# Patient Record
Sex: Female | Born: 1960 | Race: Black or African American | Hispanic: No | Marital: Single | Smoking: Former smoker
Health system: Southern US, Community
[De-identification: ages and names within clinical notes are randomized; demographics above are authoritative.]

## PROBLEM LIST (undated history)

## (undated) DIAGNOSIS — M1990 Unspecified osteoarthritis, unspecified site: Secondary | ICD-10-CM

## (undated) DIAGNOSIS — D649 Anemia, unspecified: Secondary | ICD-10-CM

## (undated) DIAGNOSIS — E785 Hyperlipidemia, unspecified: Secondary | ICD-10-CM

## (undated) DIAGNOSIS — R7301 Impaired fasting glucose: Secondary | ICD-10-CM

## (undated) DIAGNOSIS — F419 Anxiety disorder, unspecified: Secondary | ICD-10-CM

## (undated) DIAGNOSIS — E8881 Metabolic syndrome: Secondary | ICD-10-CM

## (undated) DIAGNOSIS — T7840XA Allergy, unspecified, initial encounter: Secondary | ICD-10-CM

## (undated) DIAGNOSIS — E669 Obesity, unspecified: Secondary | ICD-10-CM

## (undated) DIAGNOSIS — I509 Heart failure, unspecified: Secondary | ICD-10-CM

## (undated) DIAGNOSIS — K219 Gastro-esophageal reflux disease without esophagitis: Secondary | ICD-10-CM

## (undated) HISTORY — DX: Anemia, unspecified: D64.9

## (undated) HISTORY — DX: Metabolic syndrome: E88.810

## (undated) HISTORY — DX: Metabolic syndrome: E88.81

## (undated) HISTORY — PX: KNEE ARTHROSCOPY: SUR90

## (undated) HISTORY — DX: Hyperlipidemia, unspecified: E78.5

## (undated) HISTORY — DX: Obesity, unspecified: E66.9

## (undated) HISTORY — DX: Allergy, unspecified, initial encounter: T78.40XA

## (undated) HISTORY — DX: Gastro-esophageal reflux disease without esophagitis: K21.9

## (undated) HISTORY — DX: Impaired fasting glucose: R73.01

## (undated) HISTORY — DX: Anxiety disorder, unspecified: F41.9

---

## 1996-02-17 DIAGNOSIS — J329 Chronic sinusitis, unspecified: Secondary | ICD-10-CM | POA: Insufficient documentation

## 1997-10-16 DIAGNOSIS — N390 Urinary tract infection, site not specified: Secondary | ICD-10-CM | POA: Insufficient documentation

## 1998-01-29 ENCOUNTER — Emergency Department (HOSPITAL_COMMUNITY): Admission: EM | Admit: 1998-01-29 | Discharge: 1998-01-29 | Payer: Self-pay | Admitting: Emergency Medicine

## 1998-03-23 ENCOUNTER — Encounter: Payer: Self-pay | Admitting: Emergency Medicine

## 1998-03-23 ENCOUNTER — Emergency Department (HOSPITAL_COMMUNITY): Admission: EM | Admit: 1998-03-23 | Discharge: 1998-03-23 | Payer: Self-pay | Admitting: Emergency Medicine

## 1998-03-24 ENCOUNTER — Encounter: Admission: RE | Admit: 1998-03-24 | Discharge: 1998-06-22 | Payer: Self-pay | Admitting: *Deleted

## 1998-03-31 ENCOUNTER — Encounter: Admission: RE | Admit: 1998-03-31 | Discharge: 1998-06-29 | Payer: Self-pay | Admitting: *Deleted

## 1998-04-02 ENCOUNTER — Encounter: Admission: RE | Admit: 1998-04-02 | Discharge: 1998-04-22 | Payer: Self-pay | Admitting: *Deleted

## 1999-10-07 ENCOUNTER — Emergency Department (HOSPITAL_COMMUNITY): Admission: EM | Admit: 1999-10-07 | Discharge: 1999-10-07 | Payer: Self-pay | Admitting: Emergency Medicine

## 1999-10-07 ENCOUNTER — Encounter: Payer: Self-pay | Admitting: Emergency Medicine

## 2001-01-01 ENCOUNTER — Emergency Department (HOSPITAL_COMMUNITY): Admission: EM | Admit: 2001-01-01 | Discharge: 2001-01-01 | Payer: Self-pay

## 2001-11-13 ENCOUNTER — Emergency Department (HOSPITAL_COMMUNITY): Admission: EM | Admit: 2001-11-13 | Discharge: 2001-11-13 | Payer: Self-pay | Admitting: Emergency Medicine

## 2001-11-17 ENCOUNTER — Emergency Department (HOSPITAL_COMMUNITY): Admission: EM | Admit: 2001-11-17 | Discharge: 2001-11-17 | Payer: Self-pay | Admitting: Emergency Medicine

## 2003-05-21 ENCOUNTER — Encounter: Admission: RE | Admit: 2003-05-21 | Discharge: 2003-05-21 | Payer: Self-pay | Admitting: General Surgery

## 2003-06-17 ENCOUNTER — Emergency Department (HOSPITAL_COMMUNITY): Admission: EM | Admit: 2003-06-17 | Discharge: 2003-06-17 | Payer: Self-pay | Admitting: Emergency Medicine

## 2004-04-18 HISTORY — PX: COLONOSCOPY: SHX174

## 2004-04-22 ENCOUNTER — Ambulatory Visit: Payer: Self-pay | Admitting: Gastroenterology

## 2004-05-07 ENCOUNTER — Ambulatory Visit: Payer: Self-pay | Admitting: Gastroenterology

## 2004-05-24 ENCOUNTER — Ambulatory Visit: Payer: Self-pay | Admitting: Oncology

## 2004-05-31 ENCOUNTER — Encounter: Admission: RE | Admit: 2004-05-31 | Discharge: 2004-08-29 | Payer: Self-pay | Admitting: Family Medicine

## 2004-06-18 ENCOUNTER — Ambulatory Visit: Payer: Self-pay | Admitting: Gastroenterology

## 2004-07-14 ENCOUNTER — Other Ambulatory Visit: Admission: RE | Admit: 2004-07-14 | Discharge: 2004-07-14 | Payer: Self-pay | Admitting: Gynecology

## 2004-07-16 ENCOUNTER — Ambulatory Visit: Payer: Self-pay | Admitting: Oncology

## 2004-09-08 ENCOUNTER — Ambulatory Visit: Payer: Self-pay | Admitting: Oncology

## 2004-09-16 ENCOUNTER — Encounter: Admission: RE | Admit: 2004-09-16 | Discharge: 2004-12-15 | Payer: Self-pay | Admitting: Family Medicine

## 2005-05-10 ENCOUNTER — Encounter: Admission: RE | Admit: 2005-05-10 | Discharge: 2005-05-10 | Payer: Self-pay | Admitting: Family Medicine

## 2005-05-19 DIAGNOSIS — R519 Headache, unspecified: Secondary | ICD-10-CM | POA: Insufficient documentation

## 2005-07-13 ENCOUNTER — Encounter: Admission: RE | Admit: 2005-07-13 | Discharge: 2005-07-13 | Payer: Self-pay | Admitting: Family Medicine

## 2006-01-09 ENCOUNTER — Ambulatory Visit: Payer: Self-pay | Admitting: Family Medicine

## 2006-01-17 ENCOUNTER — Ambulatory Visit (HOSPITAL_COMMUNITY): Admission: RE | Admit: 2006-01-17 | Discharge: 2006-01-17 | Payer: Self-pay | Admitting: *Deleted

## 2006-05-25 ENCOUNTER — Ambulatory Visit: Payer: Self-pay | Admitting: Family Medicine

## 2006-05-29 ENCOUNTER — Ambulatory Visit: Payer: Self-pay | Admitting: Family Medicine

## 2006-09-15 ENCOUNTER — Ambulatory Visit: Payer: Self-pay | Admitting: Family Medicine

## 2006-09-15 ENCOUNTER — Other Ambulatory Visit: Admission: RE | Admit: 2006-09-15 | Discharge: 2006-09-15 | Payer: Self-pay | Admitting: Family Medicine

## 2006-09-19 ENCOUNTER — Ambulatory Visit: Payer: Self-pay | Admitting: Family Medicine

## 2006-12-13 ENCOUNTER — Ambulatory Visit: Payer: Self-pay | Admitting: Family Medicine

## 2008-12-10 ENCOUNTER — Ambulatory Visit: Payer: Self-pay | Admitting: Family Medicine

## 2010-01-05 ENCOUNTER — Ambulatory Visit: Payer: Self-pay | Admitting: Family Medicine

## 2010-06-10 ENCOUNTER — Institutional Professional Consult (permissible substitution) (INDEPENDENT_AMBULATORY_CARE_PROVIDER_SITE_OTHER): Payer: BC Managed Care – PPO | Admitting: Family Medicine

## 2010-06-10 DIAGNOSIS — N390 Urinary tract infection, site not specified: Secondary | ICD-10-CM

## 2010-06-10 DIAGNOSIS — J0100 Acute maxillary sinusitis, unspecified: Secondary | ICD-10-CM

## 2010-06-11 ENCOUNTER — Ambulatory Visit (INDEPENDENT_AMBULATORY_CARE_PROVIDER_SITE_OTHER): Payer: BC Managed Care – PPO

## 2010-06-11 DIAGNOSIS — E785 Hyperlipidemia, unspecified: Secondary | ICD-10-CM

## 2010-06-11 DIAGNOSIS — R7301 Impaired fasting glucose: Secondary | ICD-10-CM

## 2010-06-21 ENCOUNTER — Ambulatory Visit: Payer: BC Managed Care – PPO | Admitting: Family Medicine

## 2010-08-19 ENCOUNTER — Encounter: Payer: Self-pay | Admitting: Family Medicine

## 2010-08-19 DIAGNOSIS — Z566 Other physical and mental strain related to work: Secondary | ICD-10-CM

## 2010-08-19 DIAGNOSIS — M25569 Pain in unspecified knee: Secondary | ICD-10-CM

## 2010-08-19 DIAGNOSIS — R220 Localized swelling, mass and lump, head: Secondary | ICD-10-CM

## 2010-08-19 DIAGNOSIS — R51 Headache: Secondary | ICD-10-CM

## 2010-08-19 DIAGNOSIS — N390 Urinary tract infection, site not specified: Secondary | ICD-10-CM

## 2010-08-19 DIAGNOSIS — J329 Chronic sinusitis, unspecified: Secondary | ICD-10-CM

## 2010-08-19 DIAGNOSIS — M549 Dorsalgia, unspecified: Secondary | ICD-10-CM

## 2010-09-03 NOTE — Op Note (Signed)
NAMEPhoebe Perch             ACCOUNT NO.:  [id number]   MEDICAL RECORD NO.:  [id number]          PATIENT TYPE:  AMB   LOCATION:  DAY                          FACILITY:  [facility]   PHYSICIAN:  Jonne Ply, MD   DATE OF BIRTH:  [birth year]   DATE OF PROCEDURE:  01/17/2006  DATE OF DISCHARGE:                                 OPERATIVE REPORT   PROCEDURE:  Ventral hernia repair.   PREOPERATIVE DIAGNOSIS:  Incarcerated ventral hernia.   POSTOPERATIVE DIAGNOSIS:  Incarcerated ventral hernia.   PROCEDURE:  Ventral hernia repair without mesh, primary repair.   SURGEON:  Glean Hess, M.D.   ANESTHESIA:  General.   DESCRIPTION:  The patient was taken to the operating room and placed in the  supine position.  After adequate general anesthesia was induced using  endotracheal tube, the abdomen was prepped and draped in normal sterile  fashion.  Using a supraumbilical vertical midline incision, I dissected down  onto an incarcerated hernia sac which was dissected free easily from the  fascial edges.  The fascial defect really only measured about 1 cm.  The  contents were reduced into the abdomen.  The fascial defect was closed with  figure-of-eight #1 Novofil x2.  This held up a good repair.  Fascial edges  had been previously cleaned off.  Adequate hemostasis was insured, and the  skin was closed with staples.  The patient tolerated the procedure well and  went to PACU in good condition.      Jonne Ply, MD  Electronically Signed     KRE/MEDQ  D:  01/17/2006  T:  01/18/2006  Job:  [id number]

## 2015-05-13 ENCOUNTER — Encounter: Payer: Self-pay | Admitting: Gastroenterology

## 2021-01-25 ENCOUNTER — Other Ambulatory Visit: Payer: Self-pay

## 2021-01-25 ENCOUNTER — Encounter (HOSPITAL_COMMUNITY): Payer: Self-pay

## 2021-01-25 ENCOUNTER — Inpatient Hospital Stay (HOSPITAL_COMMUNITY)
Admission: EM | Admit: 2021-01-25 | Discharge: 2021-01-27 | DRG: 812 | Disposition: A | Discharge status: Home / Self Care | Payer: BLUE CROSS/BLUE SHIELD | Attending: Internal Medicine | Admitting: Internal Medicine

## 2021-01-25 ENCOUNTER — Emergency Department (HOSPITAL_COMMUNITY): Payer: BLUE CROSS/BLUE SHIELD

## 2021-01-25 DIAGNOSIS — M7989 Other specified soft tissue disorders: Secondary | ICD-10-CM | POA: Diagnosis present

## 2021-01-25 DIAGNOSIS — E8881 Metabolic syndrome: Secondary | ICD-10-CM | POA: Diagnosis present

## 2021-01-25 DIAGNOSIS — Z6841 Body Mass Index (BMI) 40.0 and over, adult: Secondary | ICD-10-CM

## 2021-01-25 DIAGNOSIS — R0602 Shortness of breath: Secondary | ICD-10-CM | POA: Diagnosis present

## 2021-01-25 DIAGNOSIS — D649 Anemia, unspecified: Secondary | ICD-10-CM | POA: Diagnosis not present

## 2021-01-25 DIAGNOSIS — K5903 Drug induced constipation: Secondary | ICD-10-CM | POA: Diagnosis present

## 2021-01-25 DIAGNOSIS — I509 Heart failure, unspecified: Secondary | ICD-10-CM

## 2021-01-25 DIAGNOSIS — Z20822 Contact with and (suspected) exposure to covid-19: Secondary | ICD-10-CM | POA: Diagnosis present

## 2021-01-25 DIAGNOSIS — R319 Hematuria, unspecified: Secondary | ICD-10-CM | POA: Diagnosis present

## 2021-01-25 DIAGNOSIS — D500 Iron deficiency anemia secondary to blood loss (chronic): Secondary | ICD-10-CM | POA: Diagnosis not present

## 2021-01-25 DIAGNOSIS — T454X5A Adverse effect of iron and its compounds, initial encounter: Secondary | ICD-10-CM | POA: Diagnosis present

## 2021-01-25 DIAGNOSIS — E119 Type 2 diabetes mellitus without complications: Secondary | ICD-10-CM | POA: Diagnosis present

## 2021-01-25 DIAGNOSIS — E876 Hypokalemia: Secondary | ICD-10-CM | POA: Diagnosis present

## 2021-01-25 DIAGNOSIS — F419 Anxiety disorder, unspecified: Secondary | ICD-10-CM | POA: Diagnosis present

## 2021-01-25 DIAGNOSIS — K219 Gastro-esophageal reflux disease without esophagitis: Secondary | ICD-10-CM | POA: Diagnosis present

## 2021-01-25 DIAGNOSIS — Z87891 Personal history of nicotine dependence: Secondary | ICD-10-CM

## 2021-01-25 DIAGNOSIS — N939 Abnormal uterine and vaginal bleeding, unspecified: Principal | ICD-10-CM | POA: Diagnosis present

## 2021-01-25 DIAGNOSIS — N950 Postmenopausal bleeding: Secondary | ICD-10-CM | POA: Diagnosis present

## 2021-01-25 LAB — URINALYSIS, ROUTINE W REFLEX MICROSCOPIC
Bilirubin Urine: NEGATIVE
Glucose, UA: NEGATIVE mg/dL
Ketones, ur: NEGATIVE mg/dL
Nitrite: NEGATIVE
Protein, ur: >=300 mg/dL — AB
Specific Gravity, Urine: 1.012 (ref 1.005–1.030)
pH: 7 (ref 5.0–8.0)

## 2021-01-25 LAB — BASIC METABOLIC PANEL
Anion gap: 8 (ref 5–15)
BUN: 15 mg/dL (ref 6–20)
CO2: 26 mmol/L (ref 22–32)
Calcium: 9 mg/dL (ref 8.9–10.3)
Chloride: 112 mmol/L — ABNORMAL HIGH (ref 98–111)
Creatinine, Ser: 0.46 mg/dL (ref 0.44–1.00)
GFR, Estimated: >60 mL/min (ref >60)
Glucose, Bld: 93 mg/dL (ref 70–99)
Potassium: 3.3 mmol/L — ABNORMAL LOW (ref 3.5–5.1)
Sodium: 146 mmol/L — ABNORMAL HIGH (ref 135–145)

## 2021-01-25 LAB — WET PREP, GENITAL
Clue Cells Wet Prep HPF POC: NONE SEEN
Sperm: NONE SEEN
Trich, Wet Prep: NONE SEEN

## 2021-01-25 LAB — CBC WITH DIFFERENTIAL/PLATELET
Abs Immature Granulocytes: 0.03 10*3/uL (ref 0.00–0.07)
Basophils Absolute: 0.1 10*3/uL (ref 0.0–0.1)
Basophils Relative: 1 %
Eosinophils Absolute: 0.3 10*3/uL (ref 0.0–0.5)
Eosinophils Relative: 4 %
HCT: 27.2 % — ABNORMAL LOW (ref 36.0–46.0)
Hemoglobin: 6.8 g/dL — CL (ref 12.0–15.0)
Immature Granulocytes: 0 %
Lymphocytes Relative: 14 %
Lymphs Abs: 1 10*3/uL (ref 0.7–4.0)
MCH: 16.2 pg — ABNORMAL LOW (ref 26.0–34.0)
MCHC: 25 g/dL — ABNORMAL LOW (ref 30.0–36.0)
MCV: 64.9 fL — ABNORMAL LOW (ref 80.0–100.0)
Monocytes Absolute: 0.8 10*3/uL (ref 0.1–1.0)
Monocytes Relative: 11 %
Neutro Abs: 5.2 10*3/uL (ref 1.7–7.7)
Neutrophils Relative %: 70 %
Platelets: 333 10*3/uL (ref 150–400)
RBC: 4.19 MIL/uL (ref 3.87–5.11)
RDW: 22.5 % — ABNORMAL HIGH (ref 11.5–15.5)
WBC: 7.4 10*3/uL (ref 4.0–10.5)
nRBC: 0 % (ref 0.0–0.2)

## 2021-01-25 LAB — BRAIN NATRIURETIC PEPTIDE: B Natriuretic Peptide: 182.4 pg/mL — ABNORMAL HIGH (ref 0.0–100.0)

## 2021-01-25 LAB — RETICULOCYTES
Immature Retic Fract: 27.2 % — ABNORMAL HIGH (ref 2.3–15.9)
RBC.: 4.14 MIL/uL (ref 3.87–5.11)
Retic Count, Absolute: 60 10*3/uL (ref 19.0–186.0)
Retic Ct Pct: 1.5 % (ref 0.4–3.1)

## 2021-01-25 LAB — RESP PANEL BY RT-PCR (FLU A&B, COVID) ARPGX2
Influenza A by PCR: NEGATIVE
Influenza B by PCR: NEGATIVE
SARS Coronavirus 2 by RT PCR: NEGATIVE

## 2021-01-25 LAB — IRON AND TIBC
Iron: 18 ug/dL — ABNORMAL LOW (ref 28–170)
Saturation Ratios: 4 % — ABNORMAL LOW (ref 10.4–31.8)
TIBC: 494 ug/dL — ABNORMAL HIGH (ref 250–450)
UIBC: 476 ug/dL

## 2021-01-25 LAB — HIV ANTIBODY (ROUTINE TESTING W REFLEX): HIV Screen 4th Generation wRfx: NONREACTIVE

## 2021-01-25 LAB — FERRITIN: Ferritin: 3 ng/mL — ABNORMAL LOW (ref 11–307)

## 2021-01-25 LAB — ABO/RH: ABO/RH(D): A POS

## 2021-01-25 LAB — VITAMIN B12: Vitamin B-12: 1177 pg/mL — ABNORMAL HIGH (ref 180–914)

## 2021-01-25 LAB — FOLATE: Folate: 17 ng/mL (ref >5.9)

## 2021-01-25 LAB — PREPARE RBC (CROSSMATCH)

## 2021-01-25 MED ORDER — SODIUM CHLORIDE 0.9 % IV SOLN
10.0000 mL/h | Freq: Once | INTRAVENOUS | Status: AC
  Administered 2021-01-25: 10 mL/h via INTRAVENOUS

## 2021-01-25 MED ORDER — ONDANSETRON HCL 4 MG/2ML IJ SOLN: 4.0000 mg | Freq: Four times a day (QID) | INTRAMUSCULAR | Status: DC | PRN

## 2021-01-25 MED ORDER — FERROUS SULFATE 325 (65 FE) MG PO TABS
325.0000 mg | ORAL_TABLET | Freq: Two times a day (BID) | ORAL | Status: DC
  Administered 2021-01-26: 325 mg via ORAL
  Filled 2021-01-25: qty 1

## 2021-01-25 MED ORDER — ONDANSETRON HCL 4 MG PO TABS: 4.0000 mg | ORAL_TABLET | Freq: Four times a day (QID) | ORAL | Status: DC | PRN

## 2021-01-25 MED ORDER — POTASSIUM CHLORIDE 2 MEQ/ML IV SOLN
INTRAVENOUS | Status: DC
  Filled 2021-01-25 (×4): qty 1000

## 2021-01-25 NOTE — ED Triage Notes (Signed)
Patient c/o vaginal bleeding x 1 month. Patient also c/o abdominal swelling and bilateral leg swelling.

## 2021-01-25 NOTE — ED Provider Notes (Signed)
Emergency Medicine Provider Triage Evaluation Note  Amber Herring , a 60 y.o. female  was evaluated in triage.  Pt complains of urinary and vaginal bleeding for a month along with bilateral lower extremity and abdominal swelling.  She states that she believes she was going through menopause and spotting intermittently however appears to have increased vaginal bleeding.  She states that when she gets up from a seated position at times she has about a "shot glass" amount of blood that she believes is coming from her vagina.  She also states that she has had blood in her urine intermittently over the past month.  She states that at times she feels like she has decreased urine output.  Regarding her legs and abdomen she feels like she has "taken on water" and has not been able to mobilize as easy.  She denies any fevers, weight loss, abdominal cramping or bloating.  She denies chest pain or shortness of breath.  She states she does not take any medications other than ibuprofen.  Review of Systems  Positive: Hematuria, vaginal bleeding Negative: Fever, abdominal pain  Physical Exam  BP 140/83 (BP Location: Left Arm)   Pulse (!) 119   Temp 98.2 F (36.8 C) (Oral)   Resp 16   Ht _0  (1.753 m)   Wt (!) 163.3 kg   LMP 01/25/2021   SpO2 99%   BMI 53.16 kg/m  Gen:   Awake, no distress   Resp:  Normal effort  MSK:   Moves extremities without difficulty  Other:  Abdomen is soft and nontender to palpation.  Bowel sounds are present but distant due to habitus.  Lower extremities with nonpitting edema.  GU exam deferred in triage.  Medical Decision Making  Medically screening exam initiated at 11:55 AM.  Appropriate orders placed.  [name] was informed that the remainder of the evaluation will be completed by another provider, this initial triage assessment does not replace that evaluation, and the importance of remaining in the ED until their evaluation is complete.     [name],  PA-C 01/25/21 1158    [name], MD 01/25/21 [id number]

## 2021-01-25 NOTE — H&P (Signed)
History and Physical   Amber Herring VXB:[id number] DOB: 19-Apr-1960 DOA: 01/25/2021  Referring MD/NP/PA: Dr. Francia Greaves  PCP: Denita Lung, MD   Outpatient Specialists: None  Patient coming from: Home  Chief Complaint: Shortness of breath and leg swelling  HPI: Amber Herring is a 60 y.o. female with medical history significant of GERD, metabolic syndrome, morbid obesity, anxiety disorder, chronic iron deficiency anemia, diabetes who has had intermittent vaginal bleed over the last few months.  Patient apparently used to have heavy periods at a younger age.  At that point she was anemic and has been transfused before with also iron therapy.  She could not tolerate the iron therapy at the time due to severe constipation.  She is now postmenopausal but she has noted intermittent vaginal bleed which is painless.  It usually happens when she gets off from laying in bed and before she gets to the bathroom a lot of times she will have the bleeding.  Is usually sizable bleed.  In the last few days she has noticed worsening lower extremity edema, shortness of breath and weakness.  Patient seen in the ER with symptomatic anemia and she is being admitted for further evaluation and treatment..  ED Course: Temperature 98.7, blood pressure 144/104, pulse 121, respirate of 24 and oxygen sat 98% on room air.  White count is 7.4, hemoglobin 6.8 and platelets 333.  Sodium 146 potassium 3.3 chloride 112 CO2 26 BUN 15 creatinine 1.47 calcium 9.0.  COVID-19 negative.  Vitamin B12 1177.  Iron level of 18 with 4% sats.  Wet prep is positive for few cells.  Patient being admitted to the hospital with what appears to be symptomatic anemia probably secondary to vaginal bleed.  Review of Systems: As per HPI otherwise 10 point review of systems negative.    Past Medical History:  Diagnosis Date   Allergy    Anemia    iron defienency   Anxiety    GERD (gastroesophageal reflux disease)    Hyperlipidemia     Impaired fasting glucose    Metabolic syndrome    Obesity     Past Surgical History:  Procedure Laterality Date   COLONOSCOPY  04-2004   KNEE ARTHROSCOPY       reports that she has quit smoking. Her smoking use included cigarettes. She has never used smokeless tobacco. She reports that she does not drink alcohol and does not use drugs.  No Known Allergies  Family History  Problem Relation Age of Onset   ALS Mother      Prior to Admission medications   Not on File    Physical Exam: Vitals:   01/25/21 1401 01/25/21 1500 01/25/21 1645 01/25/21 1800  BP: (!) 144/104 135/78 138/72 122/80  Pulse: 100 (!) 116 (!) 118 (!) 119  Resp: 18 18 (!) 24 18  Temp:      TempSrc:      SpO2: 98% 98% 100% 100%  Weight:      Height:          Constitutional: Morbidly obese, stable, pleasant no distress Vitals:   01/25/21 1401 01/25/21 1500 01/25/21 1645 01/25/21 1800  BP: (!) 144/104 135/78 138/72 122/80  Pulse: 100 (!) 116 (!) 118 (!) 119  Resp: 18 18 (!) 24 18  Temp:      TempSrc:      SpO2: 98% 98% 100% 100%  Weight:      Height:       Eyes: PERRL, lids and conjunctivae  normal ENMT: Mucous membranes are moist. Posterior pharynx clear of any exudate or lesions.Normal dentition.  Neck: normal, supple, no masses, no thyromegaly Respiratory: clear to auscultation bilaterally, no wheezing, no crackles. Normal respiratory effort. No accessory muscle use.  Cardiovascular: Sinus tachycardia, no murmurs / rubs / gallops.  1+ pedal edema.  2+ pedal pulses. No carotid bruits.  Abdomen: no tenderness, no masses palpated. No hepatosplenomegaly. Bowel sounds positive.  Musculoskeletal: no clubbing / cyanosis. No joint deformity upper and lower extremities. Good ROM, no contractures. Normal muscle tone.  Skin: Diffuse chronic whole body skin lesions, lesions, ulcers. No induration Neurologic: CN 2-12 grossly intact. Sensation intact, DTR normal. Strength 5/5 in all 4.  Psychiatric: Normal  judgment and insight. Alert and oriented x 3. Normal mood.     Labs on Admission: I have personally reviewed following labs and imaging studies  CBC: Recent Labs  Lab 01/25/21 1212  WBC 7.4  NEUTROABS 5.2  HGB 6.8*  HCT 27.2*  MCV 64.9*  PLT 654   Basic Metabolic Panel: Recent Labs  Lab 01/25/21 1212  NA 146*  K 3.3*  CL 112*  CO2 26  GLUCOSE 93  BUN 15  CREATININE 0.46  CALCIUM 9.0   GFR: Estimated Creatinine Clearance: 127.1 mL/min (by C-G formula based on SCr of 0.46 mg/dL). Liver Function Tests: No results for input(s): AST, ALT, ALKPHOS, BILITOT, PROT, ALBUMIN in the last 168 hours. No results for input(s): LIPASE, AMYLASE in the last 168 hours. No results for input(s): AMMONIA in the last 168 hours. Coagulation Profile: No results for input(s): INR, PROTIME in the last 168 hours. Cardiac Enzymes: No results for input(s): CKTOTAL, CKMB, CKMBINDEX, TROPONINI in the last 168 hours. BNP (last 3 results) No results for input(s): PROBNP in the last 8760 hours. HbA1C: No results for input(s): HGBA1C in the last 72 hours. CBG: No results for input(s): GLUCAP in the last 168 hours. Lipid Profile: No results for input(s): CHOL, HDL, LDLCALC, TRIG, CHOLHDL, LDLDIRECT in the last 72 hours. Thyroid Function Tests: No results for input(s): TSH, T4TOTAL, FREET4, T3FREE, THYROIDAB in the last 72 hours. Anemia Panel: Recent Labs    01/25/21 1503 01/25/21 1540 01/25/21 1600  VITAMINB12  --   --  1,177*  FOLATE  --  17.0  --   FERRITIN  --   --  3*  TIBC  --   --  494*  IRON  --   --  18*  RETICCTPCT 1.5  --   --    Urine analysis:    Component Value Date/Time   COLORURINE YELLOW 01/25/2021 1430   APPEARANCEUR HAZY (A) 01/25/2021 1430   LABSPEC 1.012 01/25/2021 1430   PHURINE 7.0 01/25/2021 1430   GLUCOSEU NEGATIVE 01/25/2021 1430   HGBUR SMALL (A) 01/25/2021 1430   BILIRUBINUR NEGATIVE 01/25/2021 1430   KETONESUR NEGATIVE 01/25/2021 1430   PROTEINUR >=300  (A) 01/25/2021 1430   NITRITE NEGATIVE 01/25/2021 1430   LEUKOCYTESUR TRACE (A) 01/25/2021 1430   Sepsis Labs: _0 (procalcitonin:4,lacticidven:4) ) Recent Results (from the past 240 hour(s))  Resp Panel by RT-PCR (Flu A&B, Covid) Nasopharyngeal Swab     Status: None   Collection Time: 01/25/21  4:30 PM   Specimen: Nasopharyngeal Swab; Nasopharyngeal(NP) swabs in vial transport medium  Result Value Ref Range Status   SARS Coronavirus 2 by RT PCR NEGATIVE NEGATIVE Final    Comment: (NOTE) SARS-CoV-2 target nucleic acids are NOT DETECTED.  The SARS-CoV-2 RNA is generally detectable in upper respiratory specimens during  the acute phase of infection. The lowest concentration of SARS-CoV-2 viral copies this assay can detect is 138 copies/mL. A negative result does not preclude SARS-Cov-2 infection and should not be used as the sole basis for treatment or other patient management decisions. A negative result may occur with  improper specimen collection/handling, submission of specimen other than nasopharyngeal swab, presence of viral mutation(s) within the areas targeted by this assay, and inadequate number of viral copies(<138 copies/mL). A negative result must be combined with clinical observations, patient history, and epidemiological information. The expected result is Negative.  Fact Sheet for Patients:  EntrepreneurPulse.com.au  Fact Sheet for Healthcare Providers:  IncredibleEmployment.be  This test is no t yet approved or cleared by the [place] FDA and  has been authorized for detection and/or diagnosis of SARS-CoV-2 by FDA under an Emergency Use Authorization (EUA). This EUA will remain  in effect (meaning this test can be used) for the duration of the COVID-19 declaration under Section 564(b)(1) of the Act, 21 U.S.C.section 360bbb-3(b)(1), unless the authorization is terminated  or revoked sooner.       Influenza A by PCR  NEGATIVE NEGATIVE Final   Influenza B by PCR NEGATIVE NEGATIVE Final    Comment: (NOTE) The Xpert Xpress SARS-CoV-2/FLU/RSV plus assay is intended as an aid in the diagnosis of influenza from Nasopharyngeal swab specimens and should not be used as a sole basis for treatment. Nasal washings and aspirates are unacceptable for Xpert Xpress SARS-CoV-2/FLU/RSV testing.  Fact Sheet for Patients: EntrepreneurPulse.com.au  Fact Sheet for Healthcare Providers: IncredibleEmployment.be  This test is not yet approved or cleared by the [place] FDA and has been authorized for detection and/or diagnosis of SARS-CoV-2 by FDA under an Emergency Use Authorization (EUA). This EUA will remain in effect (meaning this test can be used) for the duration of the COVID-19 declaration under Section 564(b)(1) of the Act, 21 U.S.C. section 360bbb-3(b)(1), unless the authorization is terminated or revoked.  Performed at [facility], [place] [address] [place], [place] [address]   Wet prep, genital     Status: Abnormal   Collection Time: 01/25/21  4:30 PM   Specimen: PATH Cytology Cervicovaginal Ancillary Only  Result Value Ref Range Status   Yeast Wet Prep HPF POC PRESENT (A) NONE SEEN Final   Trich, Wet Prep NONE SEEN NONE SEEN Final   Clue Cells Wet Prep HPF POC NONE SEEN NONE SEEN Final   WBC, Wet Prep HPF POC FEW (A) NONE SEEN Final   Sperm NONE SEEN  Final    Comment: Performed at [facility], [place] [address], [place], [place] [address]     Radiological Exams on Admission: DG Chest 2 View  Result Date: 01/25/2021 CLINICAL DATA:  Abdominal swelling and lower extremity swelling with shortness of breath. EXAM: CHEST - 2 VIEW COMPARISON:  None. FINDINGS: Mild, diffusely increased interstitial lung markings are seen. Mild linear atelectasis is noted within the lateral aspects of the bilateral lung bases. There is no evidence of  a pleural effusion or pneumothorax. The cardiac silhouette is moderately enlarged. The visualized skeletal structures are unremarkable. IMPRESSION: 1. Cardiomegaly with mild interstitial edema. 2. Mild bibasilar linear atelectasis. Electronically Signed   By: Virgina Norfolk M.D.   On: 01/25/2021 17:46      Assessment/Plan Principal Problem:   Symptomatic anemia Active Problems:   Morbid obesity (HCC)   Vaginal bleeding   Iron deficiency anemia due to chronic blood loss   Hypokalemia     #  1 symptomatic anemia: Secondary to acute on chronic bleed.  Mostly GI related.  Patient will be admitted.  We will transfuse 1 unit of packed red blood cells.  Observe patient overnight.  ER has discussed with OB/GYN.  Since there is no active ongoing bleeding mainly pulsatile bleed, recommendation from OB/GYN is outpatient follow-up to evaluate the cause.  #2 morbid obesity: Stable at baseline.  #3 recurrent vaginal bleed: Continue with outpatient follow-up with OB/GYN.  #4 chronic iron deficiency: Patient will need iron therapy at discharge.  Recommends scheduled laxatives with her iron treatment.  #5 hypokalemia: Replete potassium.   DVT prophylaxis: SCD Code Status: Full code Family Communication: No family at bedside Disposition Plan: Home Consults called: None at the moment Admission status: Observation  Severity of Illness: The appropriate patient status for this patient is OBSERVATION. Observation status is judged to be reasonable and necessary in order to provide the required intensity of service to ensure the patient's safety. The patient's presenting symptoms, physical exam findings, and initial radiographic and laboratory data in the context of their medical condition is felt to place them at decreased risk for further clinical deterioration. Furthermore, it is anticipated that the patient will be medically stable for discharge from the hospital within 2 midnights of admission. The  following factors support the patient status of observation.   " The patient's presenting symptoms include symptomatic anemia. " The physical exam findings include pale with lower extremity edema. " The initial radiographic and laboratory data are hemoglobin 6.8.   Barbette Merino MD [facility] Pager [phone]  If 7PM-7AM, please contact night-coverage [email or web address] Password [email or web address]  01/25/2021, 7:02 PM

## 2021-01-25 NOTE — ED Provider Notes (Signed)
[facility] DEPT Provider Note   CSN: [id number] Arrival date & time: 01/25/21  1104     History Chief Complaint  Patient presents with   Vaginal Bleeding   Leg Swelling    Amber Herring is a 60 y.o. female with past medical history significant for obesity, diabetes, anemia who presents with 1 month of menstrual bleeding intermittently, occasional crampy pelvic pain, as well as worsening swelling of her lower legs, and shortness of breath.  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  Patient reports that she has had on and off bleeding since her menstrual cycle stopped being regular several years ago, but she has noticed that she sees up to a shock last full of blood intermittently when she stands up, as well as has noted some blood in her urine.  Patient denies any pain with urination, vaginal pain, change in odor, or vaginal discharge.  Patient reports that she has not had to have a transfusion before.  Patient denies chest pain.  Patient denies history of acute coronary syndrome or heart failure.  Patient does not take any medication other than ibuprofen.  Reports she does not follow with a gynecologist, has had no evaluation for menstrual bleeding or post-menopausal bleeding.  Patient is described a frequent pins and needle sensation in bilateral lower extremities along with the new fluid overload.  Patient denies hematemesis, melena, hematochezia.   Vaginal Bleeding Associated symptoms: no nausea and no vaginal discharge       Past Medical History:  Diagnosis Date   Allergy    Anemia    iron defienency   Anxiety    GERD (gastroesophageal reflux disease)    Hyperlipidemia    Impaired fasting glucose    Metabolic syndrome    Obesity     Patient Active Problem List   Diagnosis Date Noted   Symptomatic anemia 01/25/2021   Facial swelling 11/17/2006    Headache(784.0) 05/19/2005   Stress at work 11/17/2003   UTI (lower urinary tract infection) 10/16/1997   Knee pain 06/16/1996   Sinusitis 02/17/1996   Back pain 10/16/1992    Past Surgical History:  Procedure Laterality Date   COLONOSCOPY  04-2004   KNEE ARTHROSCOPY       OB History   No obstetric history on file.     Family History  Problem Relation Age of Onset   ALS Mother     Social History   Tobacco Use   Smoking status: Former    Types: Cigarettes   Smokeless tobacco: Never  Vaping Use   Vaping Use: Never used  Substance Use Topics   Alcohol use: Never   Drug use: Never    Home Medications Prior to Admission medications   Not on File    Allergies    Patient has no known allergies.  Review of Systems   Review of Systems  Respiratory:  Positive for shortness of breath. Negative for chest tightness.   Cardiovascular:  Negative for chest pain and palpitations.  Gastrointestinal:  Positive for abdominal distention. Negative for blood in stool, constipation, diarrhea, nausea and vomiting.  Genitourinary:  Positive for frequency and vaginal bleeding. Negative for vaginal discharge and vaginal pain.  All other systems reviewed and are negative.  Physical Exam Updated Vital Signs BP 122/80   Pulse (!) 119   Temp 98.2 F (36.8 C) (Oral)   Resp 18  Ht _0  (1.753 m)   Wt (!) 163.3 kg   LMP 01/25/2021   SpO2 100%   BMI 53.16 kg/m   Physical Exam Vitals and nursing note reviewed.  Constitutional:      Appearance: Normal appearance. She is obese. She is ill-appearing.     Comments: Obese, fluid overloaded, pale woman sitting on bed in no acute distress  HENT:     Head: Normocephalic and atraumatic.  Eyes:     General: Scleral icterus present.        Right eye: No discharge.        Left eye: No discharge.     Comments: Patient has some evidence of pinguecula right eye, minimal scleral icterus  Periorbital edema  Cardiovascular:     Rate  and Rhythm: Regular rhythm. Tachycardia present.     Heart sounds: No murmur heard.   No friction rub. No gallop.  Pulmonary:     Effort: Pulmonary effort is normal.     Breath sounds: Normal breath sounds.  Abdominal:     General: Bowel sounds are normal. There is distension.     Palpations: Abdomen is soft.     Tenderness: There is no abdominal tenderness. There is no right CVA tenderness or left CVA tenderness.  Genitourinary:    General: Normal vulva.     Comments: Multiparous, normal appearing cervix Small amount of mucus and blood in vaginal vault Bimanual exam tolerated with some feeling of pressure without pain No masses palpated, no polyps seen on speculum examination Musculoskeletal:     Right lower leg: Edema present.     Left lower leg: Edema present.     Comments: 3-4+ pitting edema equal bilateral lower extremities, dry skin and evidence of some early stasis dermatitis, no ulcers or signs of infection.  No tenderness to palpation throughout lower extremities.  Skin:    General: Skin is warm and dry.     Capillary Refill: Capillary refill takes 2 to 3 seconds.  Neurological:     Mental Status: She is alert and oriented to person, place, and time.  Psychiatric:        Mood and Affect: Mood normal.        Behavior: Behavior normal.    ED Results / Procedures / Treatments   Labs (all labs ordered are listed, but only abnormal results are displayed) Labs Reviewed  WET PREP, GENITAL - Abnormal; Notable for the following components:      Result Value   Yeast Wet Prep HPF POC PRESENT (*)    WBC, Wet Prep HPF POC FEW (*)    All other components within normal limits  URINALYSIS, ROUTINE W REFLEX MICROSCOPIC - Abnormal; Notable for the following components:   APPearance HAZY (*)    Hgb urine dipstick SMALL (*)    Protein, ur >=300 (*)    Leukocytes,Ua TRACE (*)    Bacteria, UA MANY (*)    All other components within normal limits  BASIC METABOLIC PANEL - Abnormal;  Notable for the following components:   Sodium 146 (*)    Potassium 3.3 (*)    Chloride 112 (*)    All other components within normal limits  CBC WITH DIFFERENTIAL/PLATELET - Abnormal; Notable for the following components:   Hemoglobin 6.8 (*)    HCT 27.2 (*)    MCV 64.9 (*)    MCH 16.2 (*)    MCHC 25.0 (*)    RDW 22.5 (*)    All other components  within normal limits  VITAMIN B12 - Abnormal; Notable for the following components:   Vitamin B-12 1,177 (*)    All other components within normal limits  IRON AND TIBC - Abnormal; Notable for the following components:   Iron 18 (*)    TIBC 494 (*)    Saturation Ratios 4 (*)    All other components within normal limits  FERRITIN - Abnormal; Notable for the following components:   Ferritin 3 (*)    All other components within normal limits  RETICULOCYTES - Abnormal; Notable for the following components:   Immature Retic Fract 27.2 (*)    All other components within normal limits  BRAIN NATRIURETIC PEPTIDE - Abnormal; Notable for the following components:   B Natriuretic Peptide 182.4 (*)    All other components within normal limits  RESP PANEL BY RT-PCR (FLU A&B, COVID) ARPGX2  FOLATE  TYPE AND SCREEN  PREPARE RBC (CROSSMATCH)  ABO/RH  GC/CHLAMYDIA PROBE AMP ([place]) NOT AT [facility]    EKG None  Radiology DG Chest 2 View  Result Date: 01/25/2021 CLINICAL DATA:  Abdominal swelling and lower extremity swelling with shortness of breath. EXAM: CHEST - 2 VIEW COMPARISON:  None. FINDINGS: Mild, diffusely increased interstitial lung markings are seen. Mild linear atelectasis is noted within the lateral aspects of the bilateral lung bases. There is no evidence of a pleural effusion or pneumothorax. The cardiac silhouette is moderately enlarged. The visualized skeletal structures are unremarkable. IMPRESSION: 1. Cardiomegaly with mild interstitial edema. 2. Mild bibasilar linear atelectasis. Electronically Signed   By: Virgina Norfolk  M.D.   On: 01/25/2021 17:46    Procedures Procedures   Medications Ordered in ED Medications  0.9 %  sodium chloride infusion (10 mL/hr Intravenous New Bag/Given 01/25/21 1755)    ED Course  I have reviewed the triage vital signs and the nursing notes.  Pertinent labs & imaging results that were available during my care of the patient were reviewed by me and considered in my medical decision making (see chart for details).    MDM Rules/Calculators/A&P                         I discussed this case with my attending physician who cosigned this note including patient's presenting symptoms, physical exam, and planned diagnostics and interventions. Attending physician stated agreement with plan or made changes to plan which were implemented.   Attending physician assessed patient at bedside.  Tachycardic, short of breath, pale woman with signs of symptomatic anemia.  Also signs of fluid overload including periorbital edema, abdominal distention, 3-4+ pitting edema of lower extremities without signs of infection or ulceration.  Initial lab work-up is significant for hemoglobin of 6.8, in context of symptomatic anemia we will type and screen, get iron studies and other investigation of bleeding as well as transfuse patient with 1 unit slowly.  May consider Lasix after transfusion given patient appears fluid overloaded, significant third spacing.  Depleted intravascularly and significant fluid overload in the third space.  Overall normal pelvic exam, however with significant vaginal bleeding and a 60 year old postmenopausal woman significant concern for pelvic abnormality including adenomyosis, fibroids, endometrial or cervical cancer.  Plan to admit for symptomatic anemia, fluid overload, and recommend OB/GYN consult for endometrial biopsy during admission.  Will consult OB/GYN at this time.  Does have elevated BNP at 182, interstitial edema on chest x-ray, cardiomegaly.  Dr. Elgie Congo with OB/GYN  does not feel the need for  urgent evaluation during hospital admission, but does want patient to follow-up for endometrial biopsy once she is discharged.  Will consult hospitalist for admission at this time for symptomatic anemia requiring transfusion as well as new onset heart failure. Hospitalist accepts admission at this time. Final Clinical Impression(s) / ED Diagnoses Final diagnoses:  Vaginal bleeding  Anemia, unspecified type  Acute heart failure, unspecified heart failure type [facility])    Rx / DC Orders ED Discharge Orders     None        Anselmo Pickler, PA-C 01/25/21 1852    Godfrey Pick, MD 01/26/21 1342

## 2021-01-26 DIAGNOSIS — D649 Anemia, unspecified: Secondary | ICD-10-CM | POA: Diagnosis not present

## 2021-01-26 DIAGNOSIS — D500 Iron deficiency anemia secondary to blood loss (chronic): Secondary | ICD-10-CM | POA: Diagnosis not present

## 2021-01-26 LAB — COMPREHENSIVE METABOLIC PANEL WITH GFR
ALT: 13 U/L (ref 0–44)
AST: 27 U/L (ref 15–41)
Albumin: 3.5 g/dL (ref 3.5–5.0)
Alkaline Phosphatase: 68 U/L (ref 38–126)
Anion gap: 6 (ref 5–15)
BUN: 14 mg/dL (ref 6–20)
CO2: 27 mmol/L (ref 22–32)
Calcium: 8.8 mg/dL — ABNORMAL LOW (ref 8.9–10.3)
Chloride: 108 mmol/L (ref 98–111)
Creatinine, Ser: 0.79 mg/dL (ref 0.44–1.00)
GFR, Estimated: >60 mL/min
Glucose, Bld: 106 mg/dL — ABNORMAL HIGH (ref 70–99)
Potassium: 4.1 mmol/L (ref 3.5–5.1)
Sodium: 141 mmol/L (ref 135–145)
Total Bilirubin: 1.1 mg/dL (ref 0.3–1.2)
Total Protein: 7.3 g/dL (ref 6.5–8.1)

## 2021-01-26 LAB — GC/CHLAMYDIA PROBE AMP (~~LOC~~) NOT AT ARMC
Chlamydia: NEGATIVE
Comment: NEGATIVE
Comment: NORMAL
Neisseria Gonorrhea: NEGATIVE

## 2021-01-26 LAB — CBC
HCT: 27.2 % — ABNORMAL LOW (ref 36.0–46.0)
Hemoglobin: 7.2 g/dL — ABNORMAL LOW (ref 12.0–15.0)
MCH: 17.3 pg — ABNORMAL LOW (ref 26.0–34.0)
MCHC: 26.5 g/dL — ABNORMAL LOW (ref 30.0–36.0)
MCV: 65.5 fL — ABNORMAL LOW (ref 80.0–100.0)
Platelets: 323 10*3/uL (ref 150–400)
RBC: 4.15 MIL/uL (ref 3.87–5.11)
RDW: 23.2 % — ABNORMAL HIGH (ref 11.5–15.5)
WBC: 7.4 10*3/uL (ref 4.0–10.5)
nRBC: 0.3 % — ABNORMAL HIGH (ref 0.0–0.2)

## 2021-01-26 LAB — PREPARE RBC (CROSSMATCH)

## 2021-01-26 LAB — HEMOGLOBIN AND HEMATOCRIT, BLOOD
HCT: 28.9 % — ABNORMAL LOW (ref 36.0–46.0)
Hemoglobin: 7.9 g/dL — ABNORMAL LOW (ref 12.0–15.0)

## 2021-01-26 MED ORDER — SODIUM CHLORIDE 0.9 % IV SOLN: INTRAVENOUS | Status: DC

## 2021-01-26 MED ORDER — SODIUM CHLORIDE 0.9 % IV SOLN
1000.0000 mg | Freq: Once | INTRAVENOUS | Status: AC
  Administered 2021-01-26: 1000 mg via INTRAVENOUS
  Filled 2021-01-26: qty 20

## 2021-01-26 MED ORDER — FUROSEMIDE 10 MG/ML IJ SOLN
20.0000 mg | Freq: Once | INTRAMUSCULAR | Status: AC
  Administered 2021-01-26: 20 mg via INTRAVENOUS
  Filled 2021-01-26: qty 4

## 2021-01-26 MED ORDER — SODIUM CHLORIDE 0.9 % IV SOLN
25.0000 mg | Freq: Once | INTRAVENOUS | Status: AC
  Administered 2021-01-26: 25 mg via INTRAVENOUS
  Filled 2021-01-26: qty 0.5

## 2021-01-26 MED ORDER — SODIUM CHLORIDE 0.9% IV SOLUTION: Freq: Once | INTRAVENOUS | Status: AC

## 2021-01-26 NOTE — ED Notes (Signed)
Per attending, cancel DC at this time and proceed with admission

## 2021-01-26 NOTE — Discharge Summary (Addendum)
Physician Discharge Summary   Patient name: Amber Herring  Admit date:     01/25/2021  Discharge date: 01/27/2021  Attending Physician: Dwyane Dee [phone]  Discharge Physician: Dwyane Dee   PCP: Denita Lung, MD    Follow-up Information     Denita Lung, MD Follow up in 1 week(s).   Specialty: Family Medicine Why: Will need GYN referral for abnormal uterine bleeding Contact information: [address] [place] [address] [phone]                 Recommendations at discharge: Follow up with PCP for GYN referral   Discharge Diagnoses Principal Problem:   Symptomatic anemia Active Problems:   Vaginal bleeding   Iron deficiency anemia due to chronic blood loss   Morbid obesity (HCC)   Hypokalemia   Resolved Diagnoses Resolved Problems:   * No resolved hospital problems. [facility] Course    Amber Herring is a 60 y.o. female with PMH GERD, metabolic syndrome, obesity, anxiety, chronic IDA, DM II who presented with intermittent vaginal bleeding. She is unaware when she technically went thru menopause.  However, she endorses that she has very intermittent spotting. She has been on oral iron but does not tolerate well chronically due to constipation. Due to worsening shortness of breath and weakness, she presented to the ER for further evaluation. Hemoglobin found to be 6.8 g/dL and she was transfused total of 3 units PRBC during hospitalization.  Hemoglobin at discharge was 8.7 g/dL. Iron stores were also noted to be severely low (Iron 18, Sat ratio 4%, ferritin 3). She underwent Infed infusion for this as well.  She was recommended to follow-up with her primary care provider for a GYN referral due to abnormal uterine bleeding.   Procedures performed:    Condition at discharge: stable  Exam Physical Exam Constitutional:      Appearance: Normal appearance. She is obese.  HENT:     Head: Normocephalic and atraumatic.      Mouth/Throat:     Mouth: Mucous membranes are moist.  Eyes:     Extraocular Movements: Extraocular movements intact.  Cardiovascular:     Rate and Rhythm: Regular rhythm. Tachycardia present.     Heart sounds: No murmur heard. Pulmonary:     Effort: Pulmonary effort is normal.     Breath sounds: Normal breath sounds. No wheezing.  Abdominal:     General: Bowel sounds are normal.     Palpations: Abdomen is soft.     Tenderness: There is no abdominal tenderness.  Musculoskeletal:        General: Normal range of motion.     Cervical back: Normal range of motion and neck supple.  Skin:    General: Skin is warm and dry.  Neurological:     General: No focal deficit present.     Mental Status: She is alert.  Psychiatric:        Mood and Affect: Mood normal.     Disposition: Home  Discharge time: greater than 30 minutes. Allergies as of 01/27/2021   No Known Allergies      Medication List     TAKE these medications    APPLE CIDER VINEGAR PO Take 1 tablet by mouth daily.   Fish Oil 1000 MG Caps Take 1,000 mg by mouth daily.   ibuprofen 200 MG tablet Commonly known as: ADVIL Take 400 mg by mouth every 6 (six) hours as needed for mild pain.  multivitamin with minerals Tabs tablet Take 1 tablet by mouth daily.   OVER THE COUNTER MEDICATION Take 1 capsule by mouth daily at 6 (six) AM. Beet root capsule   Primatene Mist 0.125 MG/ACT Aero Generic drug: EPINEPHrine Inhale 1 puff into the lungs daily as needed (wheezing).        DG Chest 2 View  Result Date: 01/25/2021 CLINICAL DATA:  Abdominal swelling and lower extremity swelling with shortness of breath. EXAM: CHEST - 2 VIEW COMPARISON:  None. FINDINGS: Mild, diffusely increased interstitial lung markings are seen. Mild linear atelectasis is noted within the lateral aspects of the bilateral lung bases. There is no evidence of a pleural effusion or pneumothorax. The cardiac silhouette is moderately enlarged. The  visualized skeletal structures are unremarkable. IMPRESSION: 1. Cardiomegaly with mild interstitial edema. 2. Mild bibasilar linear atelectasis. Electronically Signed   By: Virgina Norfolk M.D.   On: 01/25/2021 17:46   Results for orders placed or performed during the hospital encounter of 01/25/21  Resp Panel by RT-PCR (Flu A&B, Covid) Nasopharyngeal Swab     Status: None   Collection Time: 01/25/21  4:30 PM   Specimen: Nasopharyngeal Swab; Nasopharyngeal(NP) swabs in vial transport medium  Result Value Ref Range Status   SARS Coronavirus 2 by RT PCR NEGATIVE NEGATIVE Final    Comment: (NOTE) SARS-CoV-2 target nucleic acids are NOT DETECTED.  The SARS-CoV-2 RNA is generally detectable in upper respiratory specimens during the acute phase of infection. The lowest concentration of SARS-CoV-2 viral copies this assay can detect is 138 copies/mL. A negative result does not preclude SARS-Cov-2 infection and should not be used as the sole basis for treatment or other patient management decisions. A negative result may occur with  improper specimen collection/handling, submission of specimen other than nasopharyngeal swab, presence of viral mutation(s) within the areas targeted by this assay, and inadequate number of viral copies(<138 copies/mL). A negative result must be combined with clinical observations, patient history, and epidemiological information. The expected result is Negative.  Fact Sheet for Patients:  EntrepreneurPulse.com.au  Fact Sheet for Healthcare Providers:  IncredibleEmployment.be  This test is no t yet approved or cleared by the [place] FDA and  has been authorized for detection and/or diagnosis of SARS-CoV-2 by FDA under an Emergency Use Authorization (EUA). This EUA will remain  in effect (meaning this test can be used) for the duration of the COVID-19 declaration under Section 564(b)(1) of the Act, 21 U.S.C.section  360bbb-3(b)(1), unless the authorization is terminated  or revoked sooner.       Influenza A by PCR NEGATIVE NEGATIVE Final   Influenza B by PCR NEGATIVE NEGATIVE Final    Comment: (NOTE) The Xpert Xpress SARS-CoV-2/FLU/RSV plus assay is intended as an aid in the diagnosis of influenza from Nasopharyngeal swab specimens and should not be used as a sole basis for treatment. Nasal washings and aspirates are unacceptable for Xpert Xpress SARS-CoV-2/FLU/RSV testing.  Fact Sheet for Patients: EntrepreneurPulse.com.au  Fact Sheet for Healthcare Providers: IncredibleEmployment.be  This test is not yet approved or cleared by the [place] FDA and has been authorized for detection and/or diagnosis of SARS-CoV-2 by FDA under an Emergency Use Authorization (EUA). This EUA will remain in effect (meaning this test can be used) for the duration of the COVID-19 declaration under Section 564(b)(1) of the Act, 21 U.S.C. section 360bbb-3(b)(1), unless the authorization is terminated or revoked.  Performed at [facility], [place] [address], [place], [place] [address]   Lenard Forth  prep, genital     Status: Abnormal   Collection Time: 01/25/21  4:30 PM   Specimen: PATH Cytology Cervicovaginal Ancillary Only  Result Value Ref Range Status   Yeast Wet Prep HPF POC PRESENT (A) NONE SEEN Final   Trich, Wet Prep NONE SEEN NONE SEEN Final   Clue Cells Wet Prep HPF POC NONE SEEN NONE SEEN Final   WBC, Wet Prep HPF POC FEW (A) NONE SEEN Final   Sperm NONE SEEN  Final    Comment: Performed at [facility], [place] [address] [place], [place] [address]    Signed:  Dwyane Dee MD.  [facility] 01/27/2021, 3:39 PM

## 2021-01-27 DIAGNOSIS — K5903 Drug induced constipation: Secondary | ICD-10-CM | POA: Diagnosis present

## 2021-01-27 DIAGNOSIS — E876 Hypokalemia: Secondary | ICD-10-CM | POA: Diagnosis present

## 2021-01-27 DIAGNOSIS — E8881 Metabolic syndrome: Secondary | ICD-10-CM | POA: Diagnosis present

## 2021-01-27 DIAGNOSIS — D500 Iron deficiency anemia secondary to blood loss (chronic): Secondary | ICD-10-CM | POA: Diagnosis present

## 2021-01-27 DIAGNOSIS — N950 Postmenopausal bleeding: Secondary | ICD-10-CM | POA: Diagnosis present

## 2021-01-27 DIAGNOSIS — T454X5A Adverse effect of iron and its compounds, initial encounter: Secondary | ICD-10-CM | POA: Diagnosis present

## 2021-01-27 DIAGNOSIS — M7989 Other specified soft tissue disorders: Secondary | ICD-10-CM | POA: Diagnosis present

## 2021-01-27 DIAGNOSIS — R319 Hematuria, unspecified: Secondary | ICD-10-CM | POA: Diagnosis present

## 2021-01-27 DIAGNOSIS — N939 Abnormal uterine and vaginal bleeding, unspecified: Secondary | ICD-10-CM | POA: Diagnosis present

## 2021-01-27 DIAGNOSIS — F419 Anxiety disorder, unspecified: Secondary | ICD-10-CM | POA: Diagnosis present

## 2021-01-27 DIAGNOSIS — D649 Anemia, unspecified: Secondary | ICD-10-CM | POA: Diagnosis present

## 2021-01-27 DIAGNOSIS — K219 Gastro-esophageal reflux disease without esophagitis: Secondary | ICD-10-CM | POA: Diagnosis present

## 2021-01-27 DIAGNOSIS — E119 Type 2 diabetes mellitus without complications: Secondary | ICD-10-CM | POA: Diagnosis present

## 2021-01-27 DIAGNOSIS — R0602 Shortness of breath: Secondary | ICD-10-CM | POA: Diagnosis present

## 2021-01-27 DIAGNOSIS — Z87891 Personal history of nicotine dependence: Secondary | ICD-10-CM | POA: Diagnosis not present

## 2021-01-27 DIAGNOSIS — Z20822 Contact with and (suspected) exposure to covid-19: Secondary | ICD-10-CM | POA: Diagnosis present

## 2021-01-27 DIAGNOSIS — Z6841 Body Mass Index (BMI) 40.0 and over, adult: Secondary | ICD-10-CM | POA: Diagnosis not present

## 2021-01-27 LAB — CBC WITH DIFFERENTIAL/PLATELET
Abs Immature Granulocytes: 0.04 10*3/uL (ref 0.00–0.07)
Basophils Absolute: 0.1 10*3/uL (ref 0.0–0.1)
Basophils Relative: 1 %
Eosinophils Absolute: 0.4 10*3/uL (ref 0.0–0.5)
Eosinophils Relative: 5 %
HCT: 29.3 % — ABNORMAL LOW (ref 36.0–46.0)
Hemoglobin: 7.9 g/dL — ABNORMAL LOW (ref 12.0–15.0)
Immature Granulocytes: 1 %
Lymphocytes Relative: 18 %
Lymphs Abs: 1.5 10*3/uL (ref 0.7–4.0)
MCH: 18 pg — ABNORMAL LOW (ref 26.0–34.0)
MCHC: 27 g/dL — ABNORMAL LOW (ref 30.0–36.0)
MCV: 66.9 fL — ABNORMAL LOW (ref 80.0–100.0)
Monocytes Absolute: 0.9 10*3/uL (ref 0.1–1.0)
Monocytes Relative: 11 %
Neutro Abs: 5.3 10*3/uL (ref 1.7–7.7)
Neutrophils Relative %: 64 %
Platelets: 311 10*3/uL (ref 150–400)
RBC: 4.38 MIL/uL (ref 3.87–5.11)
RDW: 24.2 % — ABNORMAL HIGH (ref 11.5–15.5)
WBC: 8.2 10*3/uL (ref 4.0–10.5)
nRBC: 0.4 % — ABNORMAL HIGH (ref 0.0–0.2)

## 2021-01-27 LAB — HEMOGLOBIN AND HEMATOCRIT, BLOOD
HCT: 32.2 % — ABNORMAL LOW (ref 36.0–46.0)
Hemoglobin: 8.7 g/dL — ABNORMAL LOW (ref 12.0–15.0)

## 2021-01-27 LAB — BASIC METABOLIC PANEL
Anion gap: 6 (ref 5–15)
BUN: 13 mg/dL (ref 6–20)
CO2: 27 mmol/L (ref 22–32)
Calcium: 8.9 mg/dL (ref 8.9–10.3)
Chloride: 108 mmol/L (ref 98–111)
Creatinine, Ser: 0.78 mg/dL (ref 0.44–1.00)
GFR, Estimated: >60 mL/min (ref >60)
Glucose, Bld: 99 mg/dL (ref 70–99)
Potassium: 3.5 mmol/L (ref 3.5–5.1)
Sodium: 141 mmol/L (ref 135–145)

## 2021-01-27 LAB — MAGNESIUM: Magnesium: 1.9 mg/dL (ref 1.7–2.4)

## 2021-01-27 LAB — PREPARE RBC (CROSSMATCH)

## 2021-01-27 MED ORDER — SODIUM CHLORIDE 0.9% IV SOLUTION
Freq: Once | INTRAVENOUS | Status: AC
  Administered 2021-01-27: 10 mL via INTRAVENOUS

## 2021-01-27 NOTE — Progress Notes (Signed)
  Progress Note    Amber Herring   FVC:[id number]  DOB: 09-13-1960  DOA: 01/25/2021     1 Date of Service: 01/27/2021    Subjective:  No events overnight.  Seen in the ER this morning awaiting a room.  Still complains of ongoing fatigue and generalized malaise.  Remains tachycardic and states that she does note feeling her heart racing.  Hospital Problems  Symptomatic anemia - Patient presented with intermittent vaginal bleeding.  Patient unsure when menopause transition took place however endorses ongoing intermittent vaginal bleeding -Hemoglobin 6.8 g/dL on admission.  She remained tachycardic requiring IV fluids and repetitive blood transfusions to achieve stability -2 units PRBC given on 01/26/2021, patient remained symptomatic with fatigue/lethargy and tachycardia -IV fluids started for overnight and planning to repeat hemoglobin in a.m. -She will need outpatient follow-up with GYN after discharge  IDA -Iron stores significantly low - INFeD ordered for repletion during hospitalization  Objective Vital signs were reviewed and unremarkable.  Vitals:   01/27/21 1200 01/27/21 1301 01/27/21 1302 01/27/21 1329  BP: (!) 140/101 (!) 138/104 (!) 138/104 (!) 138/104  Pulse: (!) 119 (!) 120 (!) 120 (!) 120  Resp: _0 Temp:  98.3 F (36.8 C) 98.3 F (36.8 C) 98.3 F (36.8 C)  TempSrc:  Oral Oral Oral  SpO2: 96%  97% 97%  Weight:      Height:       (!) 163.3 kg  Exam Physical Exam Constitutional:      Appearance: Normal appearance. She is obese.  HENT:     Head: Normocephalic and atraumatic.     Mouth/Throat:     Mouth: Mucous membranes are moist.  Eyes:     Extraocular Movements: Extraocular movements intact.  Cardiovascular:     Rate and Rhythm: Normal rate and regular rhythm.     Heart sounds: Normal heart sounds.  Pulmonary:     Effort: Pulmonary effort is normal.     Breath sounds: Normal breath sounds. No wheezing.  Abdominal:     General: Bowel  sounds are normal. There is no distension.     Palpations: Abdomen is soft.  Musculoskeletal:        General: Normal range of motion.     Cervical back: Normal range of motion and neck supple.  Skin:    General: Skin is warm and dry.  Neurological:     General: No focal deficit present.     Mental Status: She is alert.  Psychiatric:        Mood and Affect: Mood normal.        Behavior: Behavior normal.     Labs / Other Information My review of labs, imaging, notes and other tests is significant for Hgb 7.9 g/dL    Time spent: Greater than 50% of the 35 minute visit was spent in counseling/coordination of care for the patient as laid out in the A&P.  Dwyane Dee, MD [facility] 01/27/2021, 3:45 PM

## 2021-01-28 LAB — BPAM RBC
Blood Product Expiration Date: 202211012359
Blood Product Expiration Date: 202211042359
Blood Product Expiration Date: 202211052359
ISSUE DATE / TIME: 202210102052
ISSUE DATE / TIME: 202210111357
ISSUE DATE / TIME: 202210120937
Unit Type and Rh: 6200
Unit Type and Rh: 6200
Unit Type and Rh: 6200

## 2021-01-28 LAB — TYPE AND SCREEN
ABO/RH(D): A POS
Antibody Screen: NEGATIVE
Unit division: 0
Unit division: 0
Unit division: 0

## 2021-01-29 ENCOUNTER — Encounter (HOSPITAL_COMMUNITY): Payer: Self-pay | Admitting: Emergency Medicine

## 2021-01-29 ENCOUNTER — Emergency Department (HOSPITAL_COMMUNITY): Payer: BLUE CROSS/BLUE SHIELD

## 2021-01-29 ENCOUNTER — Other Ambulatory Visit: Payer: Self-pay

## 2021-01-29 ENCOUNTER — Emergency Department (HOSPITAL_COMMUNITY)
Admission: EM | Admit: 2021-01-29 | Discharge: 2021-01-29 | Disposition: A | Discharge status: Home / Self Care | Payer: BLUE CROSS/BLUE SHIELD | Attending: Emergency Medicine | Admitting: Emergency Medicine

## 2021-01-29 DIAGNOSIS — S62343A Nondisplaced fracture of base of third metacarpal bone, left hand, initial encounter for closed fracture: Secondary | ICD-10-CM | POA: Insufficient documentation

## 2021-01-29 DIAGNOSIS — S62347A Nondisplaced fracture of base of fifth metacarpal bone. left hand, initial encounter for closed fracture: Secondary | ICD-10-CM | POA: Insufficient documentation

## 2021-01-29 DIAGNOSIS — S62341A Nondisplaced fracture of base of second metacarpal bone. left hand, initial encounter for closed fracture: Secondary | ICD-10-CM | POA: Diagnosis not present

## 2021-01-29 DIAGNOSIS — W01198A Fall on same level from slipping, tripping and stumbling with subsequent striking against other object, initial encounter: Secondary | ICD-10-CM | POA: Diagnosis not present

## 2021-01-29 DIAGNOSIS — Y92812 Truck as the place of occurrence of the external cause: Secondary | ICD-10-CM | POA: Diagnosis not present

## 2021-01-29 DIAGNOSIS — W19XXXA Unspecified fall, initial encounter: Secondary | ICD-10-CM

## 2021-01-29 DIAGNOSIS — Z87891 Personal history of nicotine dependence: Secondary | ICD-10-CM | POA: Insufficient documentation

## 2021-01-29 DIAGNOSIS — S6992XA Unspecified injury of left wrist, hand and finger(s), initial encounter: Secondary | ICD-10-CM | POA: Diagnosis present

## 2021-01-29 DIAGNOSIS — S62309A Unspecified fracture of unspecified metacarpal bone, initial encounter for closed fracture: Secondary | ICD-10-CM

## 2021-01-29 MED ORDER — HYDROCODONE-ACETAMINOPHEN 5-325 MG PO TABS: 1.0000 | ORAL_TABLET | Freq: Four times a day (QID) | ORAL | 0 refills | Status: DC | PRN

## 2021-01-29 MED ORDER — HYDROCODONE-ACETAMINOPHEN 5-325 MG PO TABS
1.0000 | ORAL_TABLET | Freq: Once | ORAL | Status: AC
  Administered 2021-01-29: 1 via ORAL
  Filled 2021-01-29: qty 1

## 2021-01-29 NOTE — ED Provider Notes (Signed)
[facility] DEPT Provider Note   CSN: [id number] Arrival date & time: 01/29/21  1250     History Chief Complaint  Patient presents with   Fredirick Lathe is a 60 y.o. female.  JARELYN BAMBACH is a 60 y.o. female with a history of anemia, hyperlipidemia, GERD, who presents to the ED for evaluation after a fall.  Patient reports she was recently discharged from the hospital after an admission for symptomatic anemia with blood transfusion.  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  She had another trip and fall injuring the same hand and wrist the following day and since then it has been increasingly painful and swollen.  She denies any numbness or tingling.  She is not on blood thinners.  No other injuries from the fall, did not hit her head and denies any neck or back injury.  The history is provided by the patient.      Past Medical History:  Diagnosis Date   Allergy    Anemia    iron defienency   Anxiety    GERD (gastroesophageal reflux disease)    Hyperlipidemia    Impaired fasting glucose    Metabolic syndrome    Obesity     Patient Active Problem List   Diagnosis Date Noted   Symptomatic anemia 01/25/2021   Morbid obesity ([place]) 01/25/2021   Vaginal bleeding 01/25/2021   Iron deficiency anemia due to chronic blood loss 01/25/2021   Hypokalemia 01/25/2021   Facial swelling 11/17/2006   Headache(784.0) 05/19/2005   Stress at work 11/17/2003   UTI (lower urinary tract infection) 10/16/1997   Knee pain 06/16/1996   Sinusitis 02/17/1996   Back pain 10/16/1992    Past Surgical History:  Procedure Laterality Date   COLONOSCOPY  04-2004   KNEE ARTHROSCOPY       OB History   No obstetric history on file.     Family History  Problem Relation Age of Onset   ALS Mother     Social History   Tobacco Use    Smoking status: Former    Types: Cigarettes   Smokeless tobacco: Never  Vaping Use   Vaping Use: Never used  Substance Use Topics   Alcohol use: Never   Drug use: Never    Home Medications Prior to Admission medications   Medication Sig Start Date End Date Taking? Authorizing Provider  HYDROcodone-acetaminophen (NORCO) 5-325 MG tablet Take 1 tablet by mouth every 6 (six) hours as needed. 01/29/21  Yes Jacqlyn Larsen, PA-C  APPLE CIDER VINEGAR PO Take 1 tablet by mouth daily.    [provider]  EPINEPHrine (PRIMATENE MIST) 0.125 MG/ACT AERO Inhale 1 puff into the lungs daily as needed (wheezing).    [provider]  ibuprofen (ADVIL) 200 MG tablet Take 400 mg by mouth every 6 (six) hours as needed for mild pain.    [provider]  Multiple Vitamin (MULTIVITAMIN WITH MINERALS) TABS tablet Take 1 tablet by mouth daily.    [provider]  Omega-3 Fatty Acids (FISH OIL) 1000 MG CAPS Take 1,000 mg by mouth daily.    [provider]  OVER THE COUNTER MEDICATION Take 1 capsule by mouth daily at 6 (six) AM. Beet root capsule    [provider]    Allergies  Patient has no known allergies.  Review of Systems   Review of Systems  Constitutional:  Negative for chills and fever.  Musculoskeletal:  Positive for arthralgias and joint swelling.  Neurological:  Negative for weakness and numbness.  All other systems reviewed and are negative.  Physical Exam Updated Vital Signs BP (!) 153/97 (BP Location: Right Arm)   Pulse 83   Temp 98 F (36.7 C) (Oral)   Resp 16   LMP 01/25/2021   SpO2 99%   Physical Exam Vitals and nursing note reviewed.  Constitutional:      General: She is not in acute distress.    Appearance: Normal appearance. She is well-developed. She is not ill-appearing or diaphoretic.  HENT:     Head: Normocephalic and atraumatic.  Eyes:     General:        Right eye: No discharge.        Left eye: No  discharge.  Pulmonary:     Effort: Pulmonary effort is normal. No respiratory distress.  Musculoskeletal:     Comments: There is tenderness and soft tissue swelling primarily on the dorsum of the left hand as well as some bruising to the palm and wrist, bruising extends to the mid forearm.  No tenderness in the upper forearm or elbow and patient has normal range of motion of the wrist and elbow but pain with movement of the wrist and fingers.  Despite swelling patient has strong radial and ulnar pulses and normal cap refill.  Normal sensation.  Skin:    General: Skin is warm and dry.  Neurological:     Mental Status: She is alert and oriented to person, place, and time.     Coordination: Coordination normal.  Psychiatric:        Mood and Affect: Mood normal.        Behavior: Behavior normal.    ED Results / Procedures / Treatments   Labs (all labs ordered are listed, but only abnormal results are displayed) Labs Reviewed - No data to display  EKG None  Radiology No results found.  Procedures Procedures   Medications Ordered in ED Medications  HYDROcodone-acetaminophen (NORCO/VICODIN) 5-325 MG per tablet 1 tablet (1 tablet Oral Given 01/29/21 1610)    ED Course  I have reviewed the triage vital signs and the nursing notes.  Pertinent labs & imaging results that were available during my care of the patient were reviewed by me and considered in my medical decision making (see chart for details).    MDM Rules/Calculators/A&P                           60 year old female presents after she tripped and fell twice injuring her left hand and wrist.  She has significant soft tissue swelling but despite this has strong radial pulse and normal sensation, no concern for compartment syndrome, no overlying erythema or warmth to suggest infection.  X-rays of the hand significant for acute fractures of the second third and fifth metacarpals involving the base, question possible additional  small nondisplaced fracture involving the base of the fourth metacarpal.  Discussed with Orion Crook with orthopedics, recommends volar splinting and outpatient follow-up.  Patient looks pain treated here in the ED and she has been placed in a volar splint which she tolerated well.  Will prescribe short course of pain medication.  And have patient call to schedule follow-up with hand specialist.  Stressed the importance of icing  and elevating the hand to help reduce swelling and gave strict return precautions.  Patient expresses understanding and agreement.  Discharged home in good condition.  Final Clinical Impression(s) / ED Diagnoses Final diagnoses:  Fall, initial encounter  Closed fracture of multiple metacarpal bones, initial encounter    Rx / DC Orders ED Discharge Orders          Ordered    HYDROcodone-acetaminophen (NORCO) 5-325 MG tablet  Every 6 hours PRN        01/29/21 1555             Jacqlyn Larsen, [place] 01/31/21 Bovina, Ankit, MD 02/01/21 1512

## 2021-01-29 NOTE — ED Triage Notes (Signed)
PT c/o fall 2 days ago with swelling to L hand.

## 2021-01-29 NOTE — Discharge Instructions (Addendum)
You have fractures to your 2nd, 3rd and 5th Metacarpals. You will need to remain in the splint until you are seen by Dr. Tempie Donning with Hand Surgery, please call to schedule a follow up appointment.   Keep splint clean and dry.  Elevate hand as much as possible to help with swelling.  You can apply ice as well.  To treat pain you can use Tylenol 650 mg and ibuprofen 400 mg every 6 hours, for severe breakthrough pain you may use 1 tablet of Norco as prescribed every 6 hours.  Use caution with this medication.  Do not drive and make sure you get up slowly to avoid further falls or injury.  If you notice that your hand is becoming numb or significantly more painful, or you notice that your fingers are becoming discolored you should immediately return to the emergency department

## 2021-01-29 NOTE — Progress Notes (Signed)
Orthopedic Tech Progress Note Patient Details:  Amber Herring [birth year] [id number]  Ortho Devices Type of Ortho Device: Volar splint Ortho Device/Splint Location: Left wrist Ortho Device/Splint Interventions: Application   Post Interventions Patient Tolerated: Well Instructions Provided: Care of device  [name] [name] 01/29/2021, 4:01 PM

## 2021-02-02 ENCOUNTER — Encounter: Payer: Self-pay | Admitting: Orthopedic Surgery

## 2021-02-02 ENCOUNTER — Other Ambulatory Visit: Payer: Self-pay

## 2021-02-02 ENCOUNTER — Ambulatory Visit: Payer: BLUE CROSS/BLUE SHIELD | Admitting: Orthopedic Surgery

## 2021-02-02 VITALS — BP 118/70 | HR 119 | Ht 69.0 in | Wt 360.0 lb

## 2021-02-02 DIAGNOSIS — M79642 Pain in left hand: Secondary | ICD-10-CM

## 2021-02-02 DIAGNOSIS — S62319A Displaced fracture of base of unspecified metacarpal bone, initial encounter for closed fracture: Secondary | ICD-10-CM

## 2021-02-02 NOTE — Progress Notes (Signed)
Office Visit Note   Patient: Amber Herring           Date of Birth: Jul 12, 1960           MRN: [id number] Visit Date: 02/02/2021              Requested by: Denita Lung, MD [place],  [place] [id number] PCP: Denita Lung, MD   Assessment & Plan: Visit Diagnoses:  1. Fracture of metacarpal base of left hand, closed, initial encounter     Plan: Discussed with patient that she has fractures of multiple metacarpal bases.  She has fractures of the base of the index and small fracture at the base of the middle finger metacarpal.  There is no evidence of subluxation or dislocation of the index or middle finger CMC joint.  The articular surface of the index metacarpal remains congruent.  She also has a small avulsion type fracture of the base of the fifth metacarpal with no displacement of the fifth CMC joint.   Discussed that we will treat her with immobilization.  We will put her in a volar splint today and have her follow-up with hand therapy.  They will work with her on range of motion, edema control and fabricate a Thermoplast splint.  I can see her back in about 3 to 4 weeks with repeat x-rays.  Follow-Up Instructions: No follow-ups on file.   Orders:  Orders Placed This Encounter  Procedures   Ambulatory referral to Occupational Therapy   No orders of the defined types were placed in this encounter.     Procedures: Splinting  Date/Time: 02/02/2021 12:02 PM Performed by: Sherilyn Cooter, MD Authorized by: Sherilyn Cooter, MD   Consent Given by:  Patient Location:  Hand  left hand Fracture Type: first metacarpal, second metacarpal and fifth metacarpal   Neurovascularly intact   Distal Perfusion: normal   Distal Sensation: normal   Manipulation Performed?: No   Immobilization:  Splint Is this the patient's first splint for this injury?: No   Splint/Brace Type:  Volar short arm Supplies Used:  Fiberglass and cotton padding Neurovascularly  intact   Distal Perfusion: normal   Distal Sensation: normal   Patient tolerance:  Patient tolerated the procedure well with no immediate complications   Clinical Data: No additional findings.   Subjective: Chief Complaint  Patient presents with   Left Hand - Fracture    This is a 60 year old left-hand-dominant female who presents for ER follow-up after a ground-level fall last Wednesday in which she landed on her left hand.  She was seen in the ER where she was found to have multiple minimally displaced metacarpal base fractures.  She describes diffuse swelling of her hand with ecchymosis in her palm.  Her pain is 5 out of 10 at worst.  She has been in a relatively tight volar splint.  She denies pain elsewhere in her wrist forearm or elbow.  She denies previous injury to this hand.   Review of Systems   Objective: Vital Signs: BP 118/70 (BP Location: Right Arm, Patient Position: Sitting)   Pulse (!) 119   Ht _0  (1.753 m)   Wt (!) 360 lb (163.3 kg)   LMP 01/25/2021   BMI 53.16 kg/m   Physical Exam Constitutional:      Appearance: She is obese.  Cardiovascular:     Pulses: Normal pulses.  Pulmonary:     Effort: Pulmonary effort is normal.  Skin:  General: Skin is warm and dry.     Capillary Refill: Capillary refill takes less than 2 seconds.     Findings: Bruising present.  Neurological:     Mental Status: She is alert.    Left Hand Exam   Tenderness  Left hand tenderness location: TTP at dorsum of hand along MC bases.   Range of Motion  The patient has normal left wrist ROM.  Muscle Strength  The patient has normal left wrist strength.  Other  Erythema: absent Sensation: normal Pulse: present  Comments:  Palmar ecchymosis with significant dorsal swelling.  Able to make near complete fist with normal digital cascade.  Full flexion/extension at wrist.       Specialty Comments:  No specialty comments available.  Imaging: 3 views of left hand  from the ER this weekend reviewed by me.  They demonstrate multiple metacarpal fractures.  She is a fracture of the index metacarpal base with a maintained congruent articular surface and no evidence of subluxation of the joint.  She has a small minimally displaced or no nondisplaced fracture of the middle finger metacarpal base.  She also has a small avulsion type fracture from the base of the fifth metacarpal with no evidence of fifth CMC subluxation or instability.   PMFS History: Patient Active Problem List   Diagnosis Date Noted   Symptomatic anemia 01/25/2021   Morbid obesity ([place]) 01/25/2021   Vaginal bleeding 01/25/2021   Iron deficiency anemia due to chronic blood loss 01/25/2021   Hypokalemia 01/25/2021   Facial swelling 11/17/2006   Headache(784.0) 05/19/2005   Stress at work 11/17/2003   UTI (lower urinary tract infection) 10/16/1997   Knee pain 06/16/1996   Sinusitis 02/17/1996   Back pain 10/16/1992   Past Medical History:  Diagnosis Date   Allergy    Anemia    iron defienency   Anxiety    GERD (gastroesophageal reflux disease)    Hyperlipidemia    Impaired fasting glucose    Metabolic syndrome    Obesity     Family History  Problem Relation Age of Onset   ALS Mother     Past Surgical History:  Procedure Laterality Date   COLONOSCOPY  04-2004   KNEE ARTHROSCOPY     Social History   Occupational History   Not on file  Tobacco Use   Smoking status: Former    Types: Cigarettes   Smokeless tobacco: Never  Vaping Use   Vaping Use: Never used  Substance and Sexual Activity   Alcohol use: Never   Drug use: Never   Sexual activity: Not on file

## 2021-02-11 ENCOUNTER — Other Ambulatory Visit: Payer: Self-pay

## 2021-02-11 ENCOUNTER — Ambulatory Visit: Payer: Self-pay | Admitting: Occupational Therapy

## 2021-02-11 ENCOUNTER — Encounter: Payer: Self-pay | Admitting: Occupational Therapy

## 2021-02-11 DIAGNOSIS — R278 Other lack of coordination: Secondary | ICD-10-CM

## 2021-02-11 DIAGNOSIS — M25642 Stiffness of left hand, not elsewhere classified: Secondary | ICD-10-CM

## 2021-02-11 DIAGNOSIS — M6281 Muscle weakness (generalized): Secondary | ICD-10-CM

## 2021-02-11 DIAGNOSIS — R600 Localized edema: Secondary | ICD-10-CM

## 2021-02-11 DIAGNOSIS — M25542 Pain in joints of left hand: Secondary | ICD-10-CM

## 2021-02-11 NOTE — Patient Instructions (Signed)
WEARING SCHEDULE:  Wear splint at ALL times except for hygiene care. Keep hand/splint dry and cover it to take a shower. Do not remove the splint. It is for protection to your broken bones in your hand.  PURPOSE:  To prevent movement and for protection until injury can heal  CARE OF SPLINT:  Keep splint away from heat sources including: stove, radiator or furnace, or a car in sunlight. The splint can melt and will no longer fit you properly  Keep away from pets and children  Clean the splint with rubbing alcohol as needed.  * During this time, make sure you also clean your hand/arm as instructed by your therapist and/or perform dressing changes as needed. Then dry hand/arm completely before replacing splint. (When cleaning hand/arm, keep it immobilized in same position until splint is replaced)  PRECAUTIONS/POTENTIAL PROBLEMS: *If you notice or experience increased pain, swelling, numbness, or a lingering reddened area from the splint: Contact your therapist immediately by calling [phone] You must wear the splint for protection, but we will get you scheduled for adjustments as quickly as possible.  (If only straps or hooks need to be replaced and NO adjustments to the splint need to be made, just call the office ahead and let them know you are coming in)  If you have any medical concerns or signs of infection, please call your doctor immediately

## 2021-02-11 NOTE — Therapy (Signed)
[facility] Physical Therapy [address] [place], [place], [address] Phone: [phone]   Fax:  [phone]  Occupational Therapy Evaluation  Patient Details  Name: Amber Herring MRN: [id number] Date of Birth: Jun 29, 1960 Referring Provider (OT): Dr Tempie Donning   Encounter Date: 02/11/2021   OT End of Session - 02/11/21 1228     Visit Number 1    Number of Visits 10    Date for OT Re-Evaluation 03/11/21   Every 10 visits   Authorization Type [employer]    Authorization - Visit Number 1    Progress Note Due on Visit 10    OT Start Time 1106    OT Stop Time 1217    OT Time Calculation (min) 71 min    Activity Tolerance Patient tolerated treatment well    Behavior During Therapy [facility] for tasks assessed/performed             Past Medical History:  Diagnosis Date   Allergy    Anemia    iron defienency   Anxiety    GERD (gastroesophageal reflux disease)    Hyperlipidemia    Impaired fasting glucose    Metabolic syndrome    Obesity     Past Surgical History:  Procedure Laterality Date   COLONOSCOPY  04-2004   KNEE ARTHROSCOPY      There were no vitals filed for this visit.   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 She presents for custom protective splinting today per Dr Tempie Donning.    Patient is accompanied by: Family member    Pertinent History Anemia, knee pain, back pain, allergy, anxiety, GERD, obesity, hypokalemia, hyperlipidemia, metabolic syndrome    Currently in Pain? Yes    Pain Score 6     Pain Location Wrist    Pain Orientation Left    Pain Descriptors / Indicators Throbbing;Discomfort    Pain Type Acute pain;Surgical pain    Pain Onset 1 to 4 weeks ago    Pain Frequency Intermittent    Aggravating Factors  getting  in and out of bed    Pain Relieving Factors resting and positioning    Multiple Pain Sites No               [facility] OT Assessment - 02/11/21 0001       Assessment   Medical Diagnosis Multiple metacarpal base fratures index and middle fingers    Referring Provider (OT) Dr Tempie Donning    Onset Date/Surgical Date 01/27/21    Hand Dominance Left    Next MD Visit TBD      Precautions   Precautions Other (comment)   As per metacarpal base fractures/healing   Required Braces or Orthoses Other Brace/Splint   Protective splinting     Restrictions   Weight Bearing Restrictions Yes    LUE Weight Bearing Non weight bearing      Balance Screen   Has the patient fallen in the past 6 months Yes   1 x fall when anemic   How many times? 1    Has the patient had a decrease in activity level because of a fear of falling?  No    Is the patient reluctant to leave their home because of a fear of falling?  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 Pt also reports WB when getting into bed and states that she has had pain. Splint is being held on with ace wrap upon arrival.      Sensation   Light Touch Appears Intact      Coordination   Gross Motor Movements  are Fluid and Coordinated Yes    Fine Motor Movements are Fluid and Coordinated No    Coordination Impaired      Edema   Edema Moderate edema noted left dorsal hand and Min edema digits as observed visually in clinic today.   Mod - significant edema L dorsal hand noted. Pt daughter also reports removing splint, that was applied in MD office, at home prior to this visit.     ROM / Strength   AROM / PROM / Strength --   NT secondary to fractures     Hand Function   Left Hand Gross Grasp Impaired    Left Hand Grip (lbs) NT    Comment NT secondary to fractures               OT Treatments/Exercises (OP) - 02/11/21 0001       ADLs   ADL Comments Patient was educated in splinting use, care and precautions as well as basic edema control techniques and tendon gliding ex's within the confines of her splint. She was cautioned to not remove the splint as it is for protection during fracture healing. She was instructed in wrapping her hand and keeping her hand dry while bathing. No lifting, puching or pulling with left UE as pt described removing her previous splint at home and WB through her left hand to get into bed.      Splinting   Splinting Pt was fitted with a custom volar splint that places her left wrist in neutral and her MP's in flexion to ~20* secondary to edema/pain (goal is MP flexion to ~45-60*).  IP's are free for A/ROM. She was educated in splinting use, care and precautions as well as basic edema control techniques and tendon gliding ex's within the confines of her splint. She was cautioned to not remove the splint as it is for protection during fracture healing             WEARING SCHEDULE:  Wear splint at ALL times except for hygiene care. Keep hand/splint dry and cover it to take a shower. Do not remove the splint. It is for protection to your broken bones in your hand.  PURPOSE:  To prevent movement and  for protection until injury can heal  CARE OF SPLINT:  Keep  splint away from heat sources including: stove, radiator or furnace, or a car in sunlight. The splint can melt and will no longer fit you properly  Keep away from pets and children  Clean the splint with rubbing alcohol as needed.  * During this time, make sure you also clean your hand/arm as instructed by your therapist and/or perform dressing changes as needed. Then dry hand/arm completely before replacing splint. (When cleaning hand/arm, keep it immobilized in same position until splint is replaced)  PRECAUTIONS/POTENTIAL PROBLEMS: *If you notice or experience increased pain, swelling, numbness, or a lingering reddened area from the splint: Contact your therapist immediately by calling [phone] You must wear the splint for protection, but we will get you scheduled for adjustments as quickly as possible.  (If only straps or hooks need to be replaced and NO adjustments to the splint need to be made, just call the office ahead and let them know you are coming in)  If you have any medical concerns or signs of infection, please call your doctor immediately    OT Education - 02/11/21 1225     Education Details Splinting use, care and precautions. Wear for protection while fractures are healing. Do not remove splint. Edema control and A/ROM digits/tendon gliding.    Person(s) Educated Patient    Methods Explanation;Demonstration;Handout    Comprehension Verbalized understanding;Need further instruction              OT Short Term Goals - 02/11/21 1254       OT SHORT TERM GOAL #1   Title Pt will be Mod I splinting use, care and precautions L hand    Time 4    Period Weeks    Status New    Target Date 03/11/21      OT SHORT TERM GOAL #2   Title Pt will be Mod I initial HEP left hand/digits for tendon gliding, A/ROM of IP joints within confines of her splint    Time 4    Period Weeks    Status New    Target Date 03/11/21               OT Long Term Goals - 02/11/21 1255        OT LONG TERM GOAL #1   Title Pt will be Mod I updated HEP L hand    Time 8    Period Weeks    Status New    Target Date 04/08/21      OT LONG TERM GOAL #2   Title Pt will be Mod I edema control techniques L hand/wrist    Time 8    Period Weeks    Status New    Target Date 04/08/21      OT LONG TERM GOAL #3   Title Pt will be Mod I ADL's for light functional activity using left as dominant hand    Time 8    Period Weeks    Status New    Target Date 04/08/21      OT LONG TERM GOAL #4   Title Pt will demonstrate improved A/ROM as seen by ability to make a flat fist in preparation for grip tasks L hand    Time 8    Period Weeks    Status New    Target Date 04/08/21      OT LONG TERM GOAL #5   Title Pt will report  pain in left hand/wrist as 3/10 or less during light functional activity and or ADL's    Time 8    Period Weeks    Status New    Target Date 04/08/21                   Plan - 02/11/21 1229     Clinical Impression Statement Pt is a left hand dominant 60 y/o female s/p multiple metacarpal base fractures to her IF, middle and small fingers after a fall on 01/27/21. Her PMH includes but is not limited to: Anemia, knee pain, back pain, allergy, anxiety, GERD, obesity, hypokalemia, hyperlipidemia, metabolic syndrome. Please see chart for complete PMH. 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 She presents today per Dr Tempie Donning for protective volar splinting, edema control and ROM. She has deficits in areas of left hand edema, she reports that she had been removing her protective splint as applied in the orthopedic office during her last visit. She reports pain in the left dorsal hand/metacarpals. She was fitted with a custom volar splint that places her left wrist in neutral and allows for MP flexion to ~20* secondary to edema/pain and IP's are free. She was educated in splinting use, care and precautions as well  as basic edema control techniques and tendon gliding ex's within the confines of her splint. She was cautioned to not remove the splint as it is for protection during fracture healing. She rates her pain as 6/10 today. She should benefit from continued out-pt OT/hand therapy for splint check and adjustments, edema management, HEP and pt-education. She will f/u in 1 week for splint check/adjustments.    OT Occupational Profile and History Problem Focused Assessment - Including review of records relating to presenting problem    Occupational performance deficits (Please refer to evaluation for details): ADL's    Body Structure / Function / Physical Skills ADL;Strength;Dexterity;Pain;Edema;UE functional use;ROM;Coordination;Flexibility;Mobility;Decreased knowledge of precautions;FMC    Rehab Potential Fair    Clinical Decision Making Limited treatment options, no task modification necessary    Comorbidities Affecting Occupational Performance: May have comorbidities impacting occupational performance    Modification or Assistance to Complete Evaluation  No modification of tasks or assist necessary to complete eval    OT Frequency Other (comment)   1-2x/week   OT Duration 8 weeks   Eval + up to 12 additional visits   OT Treatment/Interventions Self-care/ADL training;Fluidtherapy;Splinting;Compression bandaging;Therapeutic activities;Therapeutic exercise;Passive range of motion;Manual Therapy;Patient/family education;Cryotherapy    Plan Splint check and adjustment, consider dorsal/clamshell type component PRN left. Edema control and IP A/ROM.    Consulted and Agree with Plan of Care Patient;Family member/caregiver    Family Member Consulted Adult daughter             Patient will benefit from skilled therapeutic intervention in order to improve the following deficits and impairments:   Body Structure / Function / Physical Skills: ADL, Strength, Dexterity, Pain, Edema, UE functional use, ROM,  Coordination, Flexibility, Mobility, Decreased knowledge of precautions, [place]       Visit Diagnosis: Pain in joint of left hand - Plan: Ot plan of care cert/re-cert  Localized edema - Plan: Ot plan of care cert/re-cert  Stiffness of left hand, not elsewhere classified - Plan: Ot plan of care cert/re-cert  Other lack of coordination - Plan: Ot plan of care cert/re-cert  Muscle weakness (generalized) - Plan: Ot plan of care cert/re-cert    Problem List  Patient Active Problem List   Diagnosis Date Noted   Symptomatic anemia 01/25/2021   Morbid obesity ([place]) 01/25/2021   Vaginal bleeding 01/25/2021   Iron deficiency anemia due to chronic blood loss 01/25/2021   Hypokalemia 01/25/2021   Facial swelling 11/17/2006   Headache(784.0) 05/19/2005   Stress at work 11/17/2003   UTI (lower urinary tract infection) 10/16/1997   Knee pain 06/16/1996   Sinusitis 02/17/1996   Back pain 10/16/1992    Almyra Deforest, OT/L 02/11/2021, 2:17 PM  [facility] Physical Therapy [address] [place], [place], [address] Phone: [phone]   Fax:  [phone]  Name: Amber Herring MRN: [id number] Date of Birth: [birth year]

## 2021-02-19 ENCOUNTER — Telehealth: Payer: Self-pay | Admitting: Orthopedic Surgery

## 2021-02-19 NOTE — Telephone Encounter (Signed)
Dr. Tempie Donning, please advise work status and provide work note.  Received call from patient. She has forms and would like to email them to me. I emailed her the Josem Kaufmann so she has my email address.. I did explain the form ([employer]) protocol verbally and also again in my email.

## 2021-02-23 ENCOUNTER — Other Ambulatory Visit: Payer: Self-pay

## 2021-02-23 ENCOUNTER — Encounter: Payer: Self-pay | Admitting: Occupational Therapy

## 2021-02-23 ENCOUNTER — Ambulatory Visit (INDEPENDENT_AMBULATORY_CARE_PROVIDER_SITE_OTHER): Payer: Self-pay | Admitting: Occupational Therapy

## 2021-02-23 DIAGNOSIS — R600 Localized edema: Secondary | ICD-10-CM

## 2021-02-23 DIAGNOSIS — M6281 Muscle weakness (generalized): Secondary | ICD-10-CM

## 2021-02-23 DIAGNOSIS — M25642 Stiffness of left hand, not elsewhere classified: Secondary | ICD-10-CM

## 2021-02-23 DIAGNOSIS — R278 Other lack of coordination: Secondary | ICD-10-CM

## 2021-02-23 DIAGNOSIS — M25542 Pain in joints of left hand: Secondary | ICD-10-CM

## 2021-02-23 NOTE — Therapy (Signed)
[facility] Physical Therapy [address] [place], [place], [address] Phone: [phone]   Fax:  [phone]  Occupational Therapy Treatment  Patient Details  Name: Amber Herring MRN: [id number] Date of Birth: [birth year] Referring Provider (OT): Dr Tempie Donning   Encounter Date: 02/23/2021   OT End of Session - 02/23/21 1612     Visit Number 2    Number of Visits 10    Date for OT Re-Evaluation 03/11/21   Every 10 visits   Authorization Type BCBS    Progress Note Due on Visit 10    OT Start Time 1434    OT Stop Time 1539    OT Time Calculation (min) 65 min    Activity Tolerance Patient tolerated treatment well    Behavior During Therapy [facility] for tasks assessed/performed             Past Medical History:  Diagnosis Date   Allergy    Anemia    iron defienency   Anxiety    GERD (gastroesophageal reflux disease)    Hyperlipidemia    Impaired fasting glucose    Metabolic syndrome    Obesity     Past Surgical History:  Procedure Laterality Date   COLONOSCOPY  04-2004   KNEE ARTHROSCOPY      There were no vitals filed for this visit.   Subjective Assessment - 02/23/21 1442     Subjective  Pt reports decreased pain and edema at this time. However she states that she sometimes wakes up at night in pain left hand.    Pertinent History Anemia, knee pain, back pain, allergy, anxiety, GERD, obesity, hypokalemia, hyperlipidemia, metabolic syndrome    Currently in Pain? No/denies    Pain Score 0-No pain    Multiple Pain Sites No                OT Treatments/Exercises (OP) - 02/23/21 0001       ADLs   ADL Comments Verbal review of splinting use, care and prcautions L hand. Pt verbalized understanding in clinic.   Issued edema control stockinette     Splinting   Splinting Pt has decreased edema noted in left hand therefore, a new custom based forearm clamshell splint was fabricated placing her MCP's in ~45* with IP's free for motion. This  splint is forearm based, volar & dorsal and all wear, care and precautions were reviewed. Also reviewed edema control techniques and elevation of her arm with frequent A/ROM to left elbow and shoulder to avoid stiffness.                    OT Education - 02/23/21 1611     Education Details Splinting use, care and precautions. Wear for protection while fractures are healing. Do not remove splint. Edema control and A/ROM digits/tendon gliding to PIP's and DIP's within splint.    Person(s) Educated Patient    Methods Explanation;Demonstration;Handout    Comprehension Verbalized understanding;Need further instruction              OT Short Term Goals - 02/11/21 1254       OT SHORT TERM GOAL #1   Title Pt will be Mod I splinting use, care and precautions L hand    Time 4    Period Weeks    Status New    Target Date 03/11/21      OT SHORT TERM GOAL #2   Title Pt will be Mod I initial HEP left hand/digits for tendon  gliding, A/ROM of IP joints within confines of her splint    Time 4    Period Weeks    Status New    Target Date 03/11/21               OT Long Term Goals - 02/11/21 1255       OT LONG TERM GOAL #1   Title Pt will be Mod I updated HEP L hand    Time 8    Period Weeks    Status New    Target Date 04/08/21      OT LONG TERM GOAL #2   Title Pt will be Mod I edema control techniques L hand/wrist    Time 8    Period Weeks    Status New    Target Date 04/08/21      OT LONG TERM GOAL #3   Title Pt will be Mod I ADL's for light functional activity using left as dominant hand    Time 8    Period Weeks    Status New    Target Date 04/08/21      OT LONG TERM GOAL #4   Title Pt will demonstrate improved A/ROM as seen by ability to make a flat fist in preparation for grip tasks L hand    Time 8    Period Weeks    Status New    Target Date 04/08/21      OT LONG TERM GOAL #5   Title Pt will report pain in left hand/wrist as 3/10 or less during  light functional activity and or ADL's    Time 8    Period Weeks    Status New    Target Date 04/08/21                   Plan - 02/23/21 1613     Clinical Impression Statement Pt has a noted decrease in edema in left hand therefore, a custom clamshell, forearm based splint was fabricated today placing her left hand MCP's in ~45* flexion with IP's free for motion. All splinting wear and care and edem acontrol techniques were reviewed in the clinic and pt verbalized understanding. Pt should benefit from f/u in 1-2 weeks for splint adjustments PRN.  She is currently 3 weeks and 6 days s/p left dominant hand metacarpal base fractures to index, middle and small fingers.    OT Occupational Profile and History Problem Focused Assessment - Including review of records relating to presenting problem    Occupational performance deficits (Please refer to evaluation for details): ADL's    Body Structure / Function / Physical Skills ADL;Strength;Dexterity;Pain;Edema;UE functional use;ROM;Coordination;Flexibility;Mobility;Decreased knowledge of precautions;FMC    Clinical Decision Making Limited treatment options, no task modification necessary    Comorbidities Affecting Occupational Performance: May have comorbidities impacting occupational performance    Modification or Assistance to Complete Evaluation  No modification of tasks or assist necessary to complete eval    OT Frequency Other (comment)   1-2x/week   OT Duration 8 weeks   8 weeks up to 12 additional visits   OT Treatment/Interventions Self-care/ADL training;Fluidtherapy;Splinting;Compression bandaging;Therapeutic activities;Therapeutic exercise;Passive range of motion;Manual Therapy;Patient/family education;Cryotherapy    Plan Splint check and adjustment  left. Edema control and IP A/ROM.    Consulted and Agree with Plan of Care Patient    Family Member Consulted Adult daughter             Patient will benefit from skilled  therapeutic intervention in  order to improve the following deficits and impairments:   Body Structure / Function / Physical Skills: ADL, Strength, Dexterity, Pain, Edema, UE functional use, ROM, Coordination, Flexibility, Mobility, Decreased knowledge of precautions, [facility]       Visit Diagnosis: Pain in joint of left hand  Localized edema  Stiffness of left hand, not elsewhere classified  Other lack of coordination  Muscle weakness (generalized)    Problem List Patient Active Problem List   Diagnosis Date Noted   Symptomatic anemia 01/25/2021   Morbid obesity ([place]) 01/25/2021   Vaginal bleeding 01/25/2021   Iron deficiency anemia due to chronic blood loss 01/25/2021   Hypokalemia 01/25/2021   Facial swelling 11/17/2006   Headache(784.0) 05/19/2005   Stress at work 11/17/2003   UTI (lower urinary tract infection) 10/16/1997   Knee pain 06/16/1996   Sinusitis 02/17/1996   Back pain 10/16/1992    Almyra Deforest, OT/L 02/23/2021, 4:21 PM  [facility] [facility] [address] [place], [place], [address] Phone: [phone]   Fax:  [phone]  Name: Amber Herring MRN: [id number] Date of Birth: 09-Jun-1960

## 2021-03-02 ENCOUNTER — Telehealth: Payer: Self-pay | Admitting: Orthopedic Surgery

## 2021-03-02 NOTE — Telephone Encounter (Signed)
Pt's daughter Caryl Pina submitted medical release form, FMLA forms, and $25.00 cash payment to [employer]. Accepted 03/02/21

## 2021-03-16 ENCOUNTER — Encounter: Payer: Self-pay | Admitting: Radiology

## 2021-03-16 ENCOUNTER — Telehealth: Payer: Self-pay | Admitting: Orthopedic Surgery

## 2021-03-16 NOTE — Telephone Encounter (Signed)
Dr. Renaldo Reel, please advise patients work status. Need note stating if patient is oow and if so, how long.  [place] forms received. To [employer].

## 2021-03-18 ENCOUNTER — Encounter: Payer: BLUE CROSS/BLUE SHIELD | Admitting: Occupational Therapy

## 2021-03-19 ENCOUNTER — Ambulatory Visit: Payer: BLUE CROSS/BLUE SHIELD | Admitting: Orthopedic Surgery

## 2021-03-22 ENCOUNTER — Ambulatory Visit (INDEPENDENT_AMBULATORY_CARE_PROVIDER_SITE_OTHER): Payer: Self-pay

## 2021-03-22 ENCOUNTER — Other Ambulatory Visit: Payer: Self-pay

## 2021-03-22 ENCOUNTER — Encounter: Payer: Self-pay | Admitting: Orthopedic Surgery

## 2021-03-22 ENCOUNTER — Telehealth: Payer: Self-pay | Admitting: Orthopedic Surgery

## 2021-03-22 ENCOUNTER — Ambulatory Visit (INDEPENDENT_AMBULATORY_CARE_PROVIDER_SITE_OTHER): Payer: Self-pay | Admitting: Orthopedic Surgery

## 2021-03-22 DIAGNOSIS — S62319A Displaced fracture of base of unspecified metacarpal bone, initial encounter for closed fracture: Secondary | ICD-10-CM

## 2021-03-22 NOTE — Telephone Encounter (Signed)
Pt's daughter Caryl Pina submitted extended short term disability forms faxed to [employer] already confirmed by [employer]. from [employer]. Paid $25.00 cash payment to [employer]. Accepted 03/22/21

## 2021-03-23 ENCOUNTER — Ambulatory Visit (INDEPENDENT_AMBULATORY_CARE_PROVIDER_SITE_OTHER): Payer: Self-pay | Admitting: Occupational Therapy

## 2021-03-23 ENCOUNTER — Encounter: Payer: Self-pay | Admitting: Occupational Therapy

## 2021-03-23 DIAGNOSIS — R600 Localized edema: Secondary | ICD-10-CM

## 2021-03-23 DIAGNOSIS — M6281 Muscle weakness (generalized): Secondary | ICD-10-CM

## 2021-03-23 DIAGNOSIS — M25642 Stiffness of left hand, not elsewhere classified: Secondary | ICD-10-CM

## 2021-03-23 DIAGNOSIS — R278 Other lack of coordination: Secondary | ICD-10-CM

## 2021-03-23 DIAGNOSIS — M25542 Pain in joints of left hand: Secondary | ICD-10-CM

## 2021-03-23 NOTE — Therapy (Signed)
[facility] [address] [place], [place], [address] Phone: [phone]   Fax:  [phone]  Occupational Therapy Treatment  Patient Details  Name: Amber Herring MRN: [id number] Date of Birth: [birth year] Referring Provider (OT): Dr Tempie Donning   Encounter Date: 03/23/2021   OT End of Session - 03/23/21 1744     Visit Number 3    Number of Visits 10    Date for OT Re-Evaluation 04/06/21    Authorization Type BCBS    Authorization - Visit Number 3    Progress Note Due on Visit 10    OT Start Time 1010    OT Stop Time 1050    OT Time Calculation (min) 40 min    Activity Tolerance Patient tolerated treatment well    Behavior During Therapy [facility] for tasks assessed/performed             Past Medical History:  Diagnosis Date   Allergy    Anemia    iron defienency   Anxiety    GERD (gastroesophageal reflux disease)    Hyperlipidemia    Impaired fasting glucose    Metabolic syndrome    Obesity     Past Surgical History:  Procedure Laterality Date   COLONOSCOPY  04-2004   KNEE ARTHROSCOPY      There were no vitals filed for this visit.   Subjective Assessment - 03/23/21 1015     Subjective  Pt reports that she needs new straps for her splint, she has not been seen since 02/23/21. Pt reports that she has been removing her splint a home when she is sitting.    Patient is accompanied by: Family member    Pertinent History Anemia, knee pain, back pain, allergy, anxiety, GERD, obesity, hypokalemia, hyperlipidemia, metabolic syndrome    Currently in Pain? Yes    Pain Score 5     Pain Location Wrist    Pain Orientation Left    Pain Descriptors / Indicators Discomfort;Aching    Pain Type Acute pain    Pain Onset More than a month ago    Pain Frequency Intermittent    Multiple Pain Sites No                OT Treatments/Exercises (OP) - 03/23/21 0001       ADLs   ADL Comments Verbal review of splinting use, care and  precautions L hand. Pt verbalized understanding in clinic.   Pt is currently 7 weeks, 6 days from initial fractures. She can remove splint for gentle A/ROM & tendon gliding, especially joints left hand. Pt will begin to wean from splint as instructed by MD & reviewed in clinic toady. Pt verbalized understanding.     Exercises   Exercises --   Pt was educated in gentle A/ROM to hand and wrist (tendon gliding & wrist flexion, extension, RD/UD) etc. Pt was able to return demonstration in the clinic. She will wean from her splint & only use for heavier activity/ADL's. She verbalized understanding     Splinting   Splinting Pt straps and velcro were removed and replaced and pt was educated in gentle A/ROM for wrist and hand. Pt wil lbeging to wean from splint during the day and use left UE to assist in ADL's and light functional activity as instructed by MD and therapist. Pt verbalized understanding after extensive instruction in the clinic.                OT Education - 03/23/21 1743  Education Details Begin wean from splint, use only with heavier activity/ADL's. Gentle A/ROM hand and wrist and light functional use &  ADL's    Person(s) Educated Patient    Methods Explanation;Demonstration    Comprehension Verbalized understanding;Returned demonstration;Need further instruction              OT Short Term Goals - 03/23/21 1747       OT SHORT TERM GOAL #1   Title Pt will be Mod I splinting use, care and precautions L hand    Time 4    Period Weeks    Status Achieved    Target Date 03/11/21      OT SHORT TERM GOAL #2   Title Pt will be Mod I initial HEP left hand/digits for tendon gliding, A/ROM of IP joints within confines of her splint    Time 4    Period Weeks    Status Achieved    Target Date 03/11/21               OT Long Term Goals - 02/11/21 1255       OT LONG TERM GOAL #1   Title Pt will be Mod I updated HEP L hand    Time 8    Period Weeks    Status New     Target Date 04/08/21      OT LONG TERM GOAL #2   Title Pt will be Mod I edema control techniques L hand/wrist    Time 8    Period Weeks    Status New    Target Date 04/08/21      OT LONG TERM GOAL #3   Title Pt will be Mod I ADL's for light functional activity using left as dominant hand    Time 8    Period Weeks    Status New    Target Date 04/08/21      OT LONG TERM GOAL #4   Title Pt will demonstrate improved A/ROM as seen by ability to make a flat fist in preparation for grip tasks L hand    Time 8    Period Weeks    Status New    Target Date 04/08/21      OT LONG TERM GOAL #5   Title Pt will report pain in left hand/wrist as 3/10 or less during light functional activity and or ADL's    Time 8    Period Weeks    Status New    Target Date 04/08/21                   Plan - 03/23/21 1745     Clinical Impression Statement Pt should benefit from weaning from splint during the day and performing gentle A/ROM to wrist and hand left. She may also remove the splint for ADL and light functional activity as she is currently 7 weeks and 6 days s/p fractures. Minor splint adjustments today to replace velcro and straps was also performed.    OT Occupational Profile and History Problem Focused Assessment - Including review of records relating to presenting problem    Occupational performance deficits (Please refer to evaluation for details): ADL's    Body Structure / Function / Physical Skills ADL;Strength;Dexterity;Pain;Edema;UE functional use;ROM;Coordination;Flexibility;Mobility;Decreased knowledge of precautions;FMC    Rehab Potential Fair    Clinical Decision Making Limited treatment options, no task modification necessary    Comorbidities Affecting Occupational Performance: May have comorbidities impacting occupational performance    Modification or  Assistance to Complete Evaluation  No modification of tasks or assist necessary to complete eval    OT Frequency Other  (comment)   1-2x/week   OT Duration 8 weeks   up to 12 additional visits   OT Treatment/Interventions Self-care/ADL training;Fluidtherapy;Splinting;Compression bandaging;Therapeutic activities;Therapeutic exercise;Passive range of motion;Manual Therapy;Patient/family education;Cryotherapy    Plan D/C splint use, focus on LTG's, begin d/c planning.    Consulted and Agree with Plan of Care Patient             Patient will benefit from skilled therapeutic intervention in order to improve the following deficits and impairments:   Body Structure / Function / Physical Skills: ADL, Strength, Dexterity, Pain, Edema, UE functional use, ROM, Coordination, Flexibility, Mobility, Decreased knowledge of precautions, [facility]       Visit Diagnosis: Pain in joint of left hand  Localized edema  Stiffness of left hand, not elsewhere classified  Other lack of coordination  Muscle weakness (generalized)    Problem List Patient Active Problem List   Diagnosis Date Noted   Symptomatic anemia 01/25/2021   Morbid obesity ([place]) 01/25/2021   Vaginal bleeding 01/25/2021   Iron deficiency anemia due to chronic blood loss 01/25/2021   Hypokalemia 01/25/2021   Facial swelling 11/17/2006   Headache(784.0) 05/19/2005   Stress at work 11/17/2003   UTI (lower urinary tract infection) 10/16/1997   Knee pain 06/16/1996   Sinusitis 02/17/1996   Back pain 10/16/1992    Almyra Deforest, OT 03/23/2021, 5:51 PM  [facility] [address] [place], [place], [address] Phone: [phone]   Fax:  [phone]  Name: Amber Herring MRN: [id number] Date of Birth: [birth year]

## 2021-03-30 ENCOUNTER — Encounter: Payer: Self-pay | Admitting: Occupational Therapy

## 2021-04-06 NOTE — Progress Notes (Signed)
Office Visit Note   Patient: Amber Herring           Date of Birth: May 12, 1960           MRN: [id number] Visit Date: 03/22/2021              Requested by: Denita Lung, MD [place],  [place] [id number] PCP: Denita Lung, MD   Assessment & Plan: Visit Diagnoses:  1. Fracture of metacarpal base of left hand, closed, initial encounter     Plan: Reviewed x-rays with patients which show questionable healing of the index metacarpal base fracture.  She has been in a thermoplast splint since the injury approximately 8 weeks ago.  We discussed starting to wean out of the splint and start using the hand.  She will see hand therapy again to work on ROM and strengthening.  She hasn't seen them for quite some time.   Follow-Up Instructions: No follow-ups on file.   Orders:  Orders Placed This Encounter  Procedures   XR Hand Complete Left   No orders of the defined types were placed in this encounter.     Procedures: No procedures performed   Clinical Data: No additional findings.   Subjective: Chief Complaint  Patient presents with   Left Hand - Follow-up    This is a 59 year old left-hand-dominant female who presents for follow up of left index metacarpal base fracture involving the index metacarpal base and the radial corner of the middle finger metacarpal base.  She is now approximately 8 weeks out from injury.  Her swelling and ecchymosis have largely resolved.  She still describes pain in the hand that is worse w/ activity.  She has been wearing a thermoplast spint.     Review of Systems   Objective: Vital Signs: There were no vitals taken for this visit.  Physical Exam Constitutional:      Appearance: Normal appearance.  Cardiovascular:     Rate and Rhythm: Normal rate.     Pulses: Normal pulses.  Pulmonary:     Effort: Pulmonary effort is normal.  Skin:    General: Skin is warm and dry.     Capillary Refill: Capillary refill takes  less than 2 seconds.  Neurological:     Mental Status: She is alert.    Left Hand Exam   Tenderness  Left hand tenderness location: TTP at radial dorsal hand.   Other  Erythema: absent Sensation: normal Pulse: present  Comments:  Swelling and ecchymosis has resolved.  Limited ROM secondary to prolonged immobilization.      Specialty Comments:  No specialty comments available.  Imaging: 3V of the left hand taken today are reviewed and interpreted by me.  They demonstrate minimal interval healing of the index metacarpal base fracture and radial corner of the middle metacarpal base fractures.    PMFS History: Patient Active Problem List   Diagnosis Date Noted   Symptomatic anemia 01/25/2021   Morbid obesity ([place]) 01/25/2021   Vaginal bleeding 01/25/2021   Iron deficiency anemia due to chronic blood loss 01/25/2021   Hypokalemia 01/25/2021   Facial swelling 11/17/2006   Headache(784.0) 05/19/2005   Stress at work 11/17/2003   UTI (lower urinary tract infection) 10/16/1997   Knee pain 06/16/1996   Sinusitis 02/17/1996   Back pain 10/16/1992   Past Medical History:  Diagnosis Date   Allergy    Anemia    iron defienency   Anxiety    GERD (gastroesophageal  reflux disease)    Hyperlipidemia    Impaired fasting glucose    Metabolic syndrome    Obesity     Family History  Problem Relation Age of Onset   ALS Mother     Past Surgical History:  Procedure Laterality Date   COLONOSCOPY  04-2004   KNEE ARTHROSCOPY     Social History   Occupational History   Not on file  Tobacco Use   Smoking status: Former    Types: Cigarettes   Smokeless tobacco: Never  Vaping Use   Vaping Use: Never used  Substance and Sexual Activity   Alcohol use: Never   Drug use: Never   Sexual activity: Not on file

## 2021-04-07 ENCOUNTER — Ambulatory Visit (INDEPENDENT_AMBULATORY_CARE_PROVIDER_SITE_OTHER): Payer: Self-pay | Admitting: Occupational Therapy

## 2021-04-07 ENCOUNTER — Encounter: Payer: Self-pay | Admitting: Occupational Therapy

## 2021-04-07 ENCOUNTER — Other Ambulatory Visit: Payer: Self-pay

## 2021-04-07 DIAGNOSIS — R278 Other lack of coordination: Secondary | ICD-10-CM

## 2021-04-07 DIAGNOSIS — M25642 Stiffness of left hand, not elsewhere classified: Secondary | ICD-10-CM

## 2021-04-07 DIAGNOSIS — M25542 Pain in joints of left hand: Secondary | ICD-10-CM

## 2021-04-07 DIAGNOSIS — R600 Localized edema: Secondary | ICD-10-CM

## 2021-04-07 DIAGNOSIS — M6281 Muscle weakness (generalized): Secondary | ICD-10-CM

## 2021-04-07 NOTE — Therapy (Signed)
[facility] [address] [place], [place], [address] Phone: [phone]   Fax:  [phone]  Occupational Therapy Treatment and Recertification  Patient Details  Name: Amber Herring MRN: [id number] Date of Birth: 1960-05-31 Referring Provider (OT): Dr Tempie Donning   Encounter Date: 04/07/2021   OT End of Session - 04/07/21 1542     Visit Number 4    Number of Visits 10    Date for OT Re-Evaluation 05/08/21   Authorization Type BCBS    Authorization - Visit Number 4    Progress Note Due on Visit 10    OT Start Time 1345    OT Stop Time 1430    OT Time Calculation (min) 45 min    Activity Tolerance Other (comment)   emotional            Past Medical History:  Diagnosis Date   Allergy    Anemia    iron defienency   Anxiety    GERD (gastroesophageal reflux disease)    Hyperlipidemia    Impaired fasting glucose    Metabolic syndrome    Obesity     Past Surgical History:  Procedure Laterality Date   COLONOSCOPY  04-2004   KNEE ARTHROSCOPY      There were no vitals filed for this visit.   Subjective Assessment - 04/07/21 1400     Subjective  I am so depressed - now my knees are hurting,  If it s not one thing its another.  I cannot pull myself up into the truck.  I am not ready for work.    Pertinent History Anemia, knee pain, back pain, allergy, anxiety, GERD, obesity, hypokalemia, hyperlipidemia, metabolic syndrome    Currently in Pain? Yes    Pain Score 3     Pain Location Arm    Pain Orientation Left    Pain Descriptors / Indicators Aching    Pain Type Other (Comment)   bruising in forearm   Pain Onset More than a month ago    Pain Frequency Intermittent    Aggravating Factors  getting up and standing hurts my knees - can't lean on my hand    Pain Relieving Factors resting                          OT Treatments/Exercises (OP) - 04/07/21 0001       ADLs   Grooming Patient reports that she has  used her left hand to use a Qtip to clean her left ear.  Discussed with patient that using her left hand in daily life skills, bathing, dressing, lighthousekeeping - is important for her hand rehab.    UB Dressing She can now hook her front opening bra with BUE    Writing Patient reports she can now write with her left hand, but hand fatigues quickly.    ADL Comments Patient arrived in transport chair, reporting pain in knees.  Patient wearing clamshell brace onleft hand.  Patient showing a large bruise on volar surface of arm and she is unaware how bruis occured.  When questioned - patient is sleeping in brace, and she believes she may have been laying on left arm.  Patient tearful this session when considering retunring to work - stating frequently that she would not be able to pull herself into a truck using her left hand.      Exercises   Exercises Hand      Hand Exercises  Other Hand Exercises Composite flexion/extension.  Patient with functional fist motion.      Modalities   Modalities Fluidotherapy      LUE Fluidotherapy   Number Minutes Fluidotherapy 10 Minutes    LUE Fluidotherapy Location Hand;Wrist;Forearm    Comments completing exercise in heat prior to stretching                    OT Education - 04/07/21 1541     Education Details reinforced that she needs to wean from splint to allow her to begin to gently use hand    Person(s) Educated Patient    Methods Explanation    Comprehension Verbalized understanding;Returned demonstration;Need further instruction              OT Short Term Goals - 04/07/21 1408       OT SHORT TERM GOAL #1   Title Pt will be Mod I splinting use, care and precautions L hand    Time 4    Period Weeks    Status Achieved    Target Date 03/11/21      OT SHORT TERM GOAL #2   Title Pt will be Mod I initial HEP left hand/digits for tendon gliding, A/ROM of IP joints within confines of her splint    Time 4    Period Weeks     Status Achieved    Target Date 03/11/21               OT Long Term Goals - 04/07/21 1408       OT LONG TERM GOAL #1   Title Pt will be Mod I updated HEP L hand    Time 8    Period Weeks    Status Achieved    Target Date 04/08/21      OT LONG TERM GOAL #2   Title Pt will be Mod I edema control techniques L hand/wrist    Time 8    Period Weeks    Status Achieved    Target Date 04/08/21      OT LONG TERM GOAL #3   Title Pt will be Mod I ADL's for light functional activity using left as dominant hand    Time 8    Period Weeks    Status On-going    Target Date 04/08/21      OT LONG TERM GOAL #4   Title Pt will demonstrate improved A/ROM as seen by ability to make a flat fist in preparation for grip tasks L hand    Time 8    Period Weeks    Status New    Target Date 04/08/21      OT LONG TERM GOAL #5   Title Pt will report pain in left hand/wrist as 3/10 or less during light functional activity and or ADL's    Time 8    Period Weeks    Status On-going    Target Date 04/08/21                   Plan - 04/07/21 1543     Clinical Impression Statement Patient needs to begin to wean from splint during the day to increase functional use of left hand.    OT Occupational Profile and History Problem Focused Assessment - Including review of records relating to presenting problem    Occupational performance deficits (Please refer to evaluation for details): ADL's    Body Structure / Function / Physical Skills ADL;Strength;Dexterity;Pain;Edema;UE functional  use;ROM;Coordination;Flexibility;Mobility;Decreased knowledge of precautions;FMC    Rehab Potential Fair    Clinical Decision Making Limited treatment options, no task modification necessary    Comorbidities Affecting Occupational Performance: May have comorbidities impacting occupational performance    Modification or Assistance to Complete Evaluation  No modification of tasks or assist necessary to complete eval     OT Frequency Other (comment)   1-2x/week   OT Duration 8 weeks   up to 12 additional visits   OT Treatment/Interventions Self-care/ADL training;Fluidtherapy;Splinting;Compression bandaging;Therapeutic activities;Therapeutic exercise;Passive range of motion;Manual Therapy;Patient/family education;Cryotherapy    Plan D/C splint use, focus on LTG's, begin d/c planning.    Consulted and Agree with Plan of Care Patient             Patient will benefit from skilled therapeutic intervention in order to improve the following deficits and impairments:   Body Structure / Function / Physical Skills: ADL, Strength, Dexterity, Pain, Edema, UE functional use, ROM, Coordination, Flexibility, Mobility, Decreased knowledge of precautions, [facility]       Visit Diagnosis: Pain in joint of left hand  Localized edema  Stiffness of left hand, not elsewhere classified  Other lack of coordination  Muscle weakness (generalized)    Problem List Patient Active Problem List   Diagnosis Date Noted   Symptomatic anemia 01/25/2021   Morbid obesity ([place]) 01/25/2021   Vaginal bleeding 01/25/2021   Iron deficiency anemia due to chronic blood loss 01/25/2021   Hypokalemia 01/25/2021   Facial swelling 11/17/2006   Headache(784.0) 05/19/2005   Stress at work 11/17/2003   UTI (lower urinary tract infection) 10/16/1997   Knee pain 06/16/1996   Sinusitis 02/17/1996   Back pain 10/16/1992    Amber Herring, OT 04/07/2021, 3:44 PM  [facility] [address] [place], [place], [address] Phone: [phone]   Fax:  [phone]  Name: Amber Herring MRN: [id number] Date of Birth: 10/13/1960

## 2021-04-16 ENCOUNTER — Encounter (HOSPITAL_COMMUNITY): Payer: Self-pay

## 2021-04-16 ENCOUNTER — Observation Stay (HOSPITAL_COMMUNITY): Payer: Medicaid Other

## 2021-04-16 ENCOUNTER — Other Ambulatory Visit: Payer: Self-pay

## 2021-04-16 ENCOUNTER — Emergency Department (HOSPITAL_COMMUNITY): Payer: Medicaid Other

## 2021-04-16 ENCOUNTER — Inpatient Hospital Stay (HOSPITAL_COMMUNITY)
Admission: EM | Admit: 2021-04-16 | Discharge: 2021-05-06 | DRG: 292 | Disposition: A | Discharge status: Home Health | Payer: Medicaid Other | Attending: Internal Medicine | Admitting: Internal Medicine

## 2021-04-16 DIAGNOSIS — L89152 Pressure ulcer of sacral region, stage 2: Secondary | ICD-10-CM | POA: Diagnosis present

## 2021-04-16 DIAGNOSIS — Z87891 Personal history of nicotine dependence: Secondary | ICD-10-CM

## 2021-04-16 DIAGNOSIS — M25569 Pain in unspecified knee: Secondary | ICD-10-CM

## 2021-04-16 DIAGNOSIS — I361 Nonrheumatic tricuspid (valve) insufficiency: Secondary | ICD-10-CM | POA: Diagnosis present

## 2021-04-16 DIAGNOSIS — I483 Typical atrial flutter: Secondary | ICD-10-CM

## 2021-04-16 DIAGNOSIS — I471 Supraventricular tachycardia: Secondary | ICD-10-CM | POA: Diagnosis not present

## 2021-04-16 DIAGNOSIS — I2723 Pulmonary hypertension due to lung diseases and hypoxia: Secondary | ICD-10-CM | POA: Diagnosis present

## 2021-04-16 DIAGNOSIS — E876 Hypokalemia: Secondary | ICD-10-CM | POA: Diagnosis not present

## 2021-04-16 DIAGNOSIS — I878 Other specified disorders of veins: Secondary | ICD-10-CM | POA: Diagnosis present

## 2021-04-16 DIAGNOSIS — E875 Hyperkalemia: Secondary | ICD-10-CM | POA: Diagnosis present

## 2021-04-16 DIAGNOSIS — L89892 Pressure ulcer of other site, stage 2: Secondary | ICD-10-CM | POA: Diagnosis present

## 2021-04-16 DIAGNOSIS — R0902 Hypoxemia: Secondary | ICD-10-CM | POA: Diagnosis not present

## 2021-04-16 DIAGNOSIS — N950 Postmenopausal bleeding: Secondary | ICD-10-CM

## 2021-04-16 DIAGNOSIS — I959 Hypotension, unspecified: Secondary | ICD-10-CM | POA: Diagnosis not present

## 2021-04-16 DIAGNOSIS — N390 Urinary tract infection, site not specified: Secondary | ICD-10-CM

## 2021-04-16 DIAGNOSIS — K5900 Constipation, unspecified: Secondary | ICD-10-CM

## 2021-04-16 DIAGNOSIS — I5043 Acute on chronic combined systolic (congestive) and diastolic (congestive) heart failure: Principal | ICD-10-CM

## 2021-04-16 DIAGNOSIS — I484 Atypical atrial flutter: Secondary | ICD-10-CM | POA: Diagnosis not present

## 2021-04-16 DIAGNOSIS — L8990 Pressure ulcer of unspecified site, unspecified stage: Secondary | ICD-10-CM | POA: Insufficient documentation

## 2021-04-16 DIAGNOSIS — N939 Abnormal uterine and vaginal bleeding, unspecified: Secondary | ICD-10-CM | POA: Diagnosis present

## 2021-04-16 DIAGNOSIS — R5381 Other malaise: Secondary | ICD-10-CM | POA: Diagnosis present

## 2021-04-16 DIAGNOSIS — E662 Morbid (severe) obesity with alveolar hypoventilation: Secondary | ICD-10-CM | POA: Diagnosis present

## 2021-04-16 DIAGNOSIS — R319 Hematuria, unspecified: Secondary | ICD-10-CM | POA: Diagnosis present

## 2021-04-16 DIAGNOSIS — R601 Generalized edema: Secondary | ICD-10-CM | POA: Diagnosis present

## 2021-04-16 DIAGNOSIS — K219 Gastro-esophageal reflux disease without esophagitis: Secondary | ICD-10-CM | POA: Diagnosis present

## 2021-04-16 DIAGNOSIS — Z20822 Contact with and (suspected) exposure to covid-19: Secondary | ICD-10-CM | POA: Diagnosis present

## 2021-04-16 DIAGNOSIS — Z820 Family history of epilepsy and other diseases of the nervous system: Secondary | ICD-10-CM

## 2021-04-16 DIAGNOSIS — D500 Iron deficiency anemia secondary to blood loss (chronic): Secondary | ICD-10-CM | POA: Diagnosis present

## 2021-04-16 DIAGNOSIS — R52 Pain, unspecified: Secondary | ICD-10-CM

## 2021-04-16 DIAGNOSIS — Z6841 Body Mass Index (BMI) 40.0 and over, adult: Secondary | ICD-10-CM

## 2021-04-16 DIAGNOSIS — I517 Cardiomegaly: Secondary | ICD-10-CM | POA: Diagnosis present

## 2021-04-16 DIAGNOSIS — I4892 Unspecified atrial flutter: Secondary | ICD-10-CM | POA: Diagnosis present

## 2021-04-16 DIAGNOSIS — J189 Pneumonia, unspecified organism: Secondary | ICD-10-CM | POA: Diagnosis present

## 2021-04-16 DIAGNOSIS — Z23 Encounter for immunization: Secondary | ICD-10-CM

## 2021-04-16 DIAGNOSIS — R079 Chest pain, unspecified: Secondary | ICD-10-CM | POA: Diagnosis present

## 2021-04-16 DIAGNOSIS — I4891 Unspecified atrial fibrillation: Secondary | ICD-10-CM | POA: Diagnosis not present

## 2021-04-16 DIAGNOSIS — R001 Bradycardia, unspecified: Secondary | ICD-10-CM | POA: Diagnosis not present

## 2021-04-16 DIAGNOSIS — R0600 Dyspnea, unspecified: Secondary | ICD-10-CM

## 2021-04-16 DIAGNOSIS — Z79899 Other long term (current) drug therapy: Secondary | ICD-10-CM

## 2021-04-16 DIAGNOSIS — F32A Depression, unspecified: Secondary | ICD-10-CM | POA: Diagnosis present

## 2021-04-16 DIAGNOSIS — E785 Hyperlipidemia, unspecified: Secondary | ICD-10-CM | POA: Diagnosis present

## 2021-04-16 LAB — COMPREHENSIVE METABOLIC PANEL
ALT: 19 U/L (ref 0–44)
AST: 29 U/L (ref 15–41)
Albumin: 3.5 g/dL (ref 3.5–5.0)
Alkaline Phosphatase: 87 U/L (ref 38–126)
Anion gap: 7 (ref 5–15)
BUN: 14 mg/dL (ref 6–20)
CO2: 27 mmol/L (ref 22–32)
Calcium: 8.7 mg/dL — ABNORMAL LOW (ref 8.9–10.3)
Chloride: 105 mmol/L (ref 98–111)
Creatinine, Ser: 0.78 mg/dL (ref 0.44–1.00)
GFR, Estimated: >60 mL/min (ref >60)
Glucose, Bld: 79 mg/dL (ref 70–99)
Potassium: 3.7 mmol/L (ref 3.5–5.1)
Sodium: 139 mmol/L (ref 135–145)
Total Bilirubin: 1.8 mg/dL — ABNORMAL HIGH (ref 0.3–1.2)
Total Protein: 7.5 g/dL (ref 6.5–8.1)

## 2021-04-16 LAB — URINALYSIS, ROUTINE W REFLEX MICROSCOPIC
Bilirubin Urine: NEGATIVE
Glucose, UA: NEGATIVE mg/dL
Ketones, ur: NEGATIVE mg/dL
Nitrite: POSITIVE — AB
Protein, ur: >=300 mg/dL — AB
RBC / HPF: >50 RBC/hpf — ABNORMAL HIGH (ref 0–5)
Specific Gravity, Urine: 1.028 (ref 1.005–1.030)
WBC, UA: >50 WBC/hpf — ABNORMAL HIGH (ref 0–5)
pH: 5 (ref 5.0–8.0)

## 2021-04-16 LAB — RESP PANEL BY RT-PCR (FLU A&B, COVID) ARPGX2
Influenza A by PCR: NEGATIVE
Influenza B by PCR: NEGATIVE
SARS Coronavirus 2 by RT PCR: NEGATIVE

## 2021-04-16 LAB — CBC WITH DIFFERENTIAL/PLATELET
Abs Immature Granulocytes: 0.01 10*3/uL (ref 0.00–0.07)
Basophils Absolute: 0.1 10*3/uL (ref 0.0–0.1)
Basophils Relative: 2 %
Eosinophils Absolute: 0.2 10*3/uL (ref 0.0–0.5)
Eosinophils Relative: 5 %
HCT: 37.5 % (ref 36.0–46.0)
Hemoglobin: 10.9 g/dL — ABNORMAL LOW (ref 12.0–15.0)
Immature Granulocytes: 0 %
Lymphocytes Relative: 20 %
Lymphs Abs: 1 10*3/uL (ref 0.7–4.0)
MCH: 22.4 pg — ABNORMAL LOW (ref 26.0–34.0)
MCHC: 29.1 g/dL — ABNORMAL LOW (ref 30.0–36.0)
MCV: 77.2 fL — ABNORMAL LOW (ref 80.0–100.0)
Monocytes Absolute: 0.8 10*3/uL (ref 0.1–1.0)
Monocytes Relative: 15 %
Neutro Abs: 3 10*3/uL (ref 1.7–7.7)
Neutrophils Relative %: 58 %
Platelets: 315 10*3/uL (ref 150–400)
RBC: 4.86 MIL/uL (ref 3.87–5.11)
RDW: 21.7 % — ABNORMAL HIGH (ref 11.5–15.5)
WBC: 5.1 10*3/uL (ref 4.0–10.5)
nRBC: 0 % (ref 0.0–0.2)

## 2021-04-16 LAB — TROPONIN I (HIGH SENSITIVITY)
Troponin I (High Sensitivity): 13 ng/L (ref <18)
Troponin I (High Sensitivity): 12 ng/L (ref <18)

## 2021-04-16 LAB — BRAIN NATRIURETIC PEPTIDE: B Natriuretic Peptide: 322 pg/mL — ABNORMAL HIGH (ref 0.0–100.0)

## 2021-04-16 MED ORDER — SODIUM CHLORIDE 0.9% FLUSH
3.0000 mL | INTRAVENOUS | Status: DC | PRN
  Administered 2021-04-28: 3 mL via INTRAVENOUS

## 2021-04-16 MED ORDER — SODIUM CHLORIDE 0.9% FLUSH
3.0000 mL | Freq: Two times a day (BID) | INTRAVENOUS | Status: DC
  Administered 2021-04-17 – 2021-05-05 (×34): 3 mL via INTRAVENOUS

## 2021-04-16 MED ORDER — SODIUM CHLORIDE 0.9 % IV SOLN: 250.0000 mL | INTRAVENOUS | Status: DC | PRN

## 2021-04-16 MED ORDER — ACETAMINOPHEN 650 MG RE SUPP: 650.0000 mg | Freq: Four times a day (QID) | RECTAL | Status: DC | PRN

## 2021-04-16 MED ORDER — HYDROCODONE-ACETAMINOPHEN 5-325 MG PO TABS
1.0000 | ORAL_TABLET | ORAL | Status: DC | PRN
  Administered 2021-04-17: 1 via ORAL
  Filled 2021-04-16: qty 1

## 2021-04-16 MED ORDER — SODIUM CHLORIDE 0.9 % IV SOLN
1.0000 g | Freq: Once | INTRAVENOUS | Status: AC
  Administered 2021-04-16: 21:00:00 1 g via INTRAVENOUS
  Filled 2021-04-16: qty 10

## 2021-04-16 MED ORDER — SODIUM CHLORIDE 0.9 % IV SOLN
1.0000 g | INTRAVENOUS | Status: DC
  Filled 2021-04-16: qty 10

## 2021-04-16 MED ORDER — ACETAMINOPHEN 325 MG PO TABS
650.0000 mg | ORAL_TABLET | Freq: Four times a day (QID) | ORAL | Status: DC | PRN
  Administered 2021-04-21 – 2021-05-06 (×6): 650 mg via ORAL
  Filled 2021-04-16 (×7): qty 2

## 2021-04-16 MED ORDER — IOHEXOL 350 MG/ML SOLN
100.0000 mL | Freq: Once | INTRAVENOUS | Status: AC | PRN
  Administered 2021-04-16: 19:00:00 100 mL via INTRAVENOUS

## 2021-04-16 NOTE — Subjective & Objective (Signed)
Generalized swelling legs and abd, SOB  Episode of CP Reported heamturia \no cough no fever

## 2021-04-16 NOTE — Assessment & Plan Note (Signed)
-  treat with Rocephin         await results of urine culture and adjust antibiotic coverage as needed

## 2021-04-16 NOTE — Assessment & Plan Note (Signed)
Intermittent occures with exertion  Trop neg ECG non ischemic Order echo d.dimer if post will need CTA

## 2021-04-16 NOTE — Assessment & Plan Note (Signed)
Order echo

## 2021-04-16 NOTE — ED Provider Notes (Signed)
[facility] DEPT Provider Note   CSN: [id number] Arrival date & time: 04/16/21  1439     History Chief Complaint  Patient presents with   Hematuria   Shortness of Breath    Amber Herring is a 60 y.o. female.  Patient presents to the ER chief complaint of shortness of breath and swelling of her lower abdomen and bilateral lower extremities.  Symptoms ongoing and waxing waning for the past week or so.  She states she had episodes of chest pain as well.  She had noticed some blood in her urine as well in the last several days intermittently.  Denies fevers or cough denies vomiting or diarrhea.  Denies current chest pain.      Past Medical History:  Diagnosis Date   Allergy    Anemia    iron defienency   Anxiety    GERD (gastroesophageal reflux disease)    Hyperlipidemia    Impaired fasting glucose    Metabolic syndrome    Obesity     Patient Active Problem List   Diagnosis Date Noted   Anasarca 04/16/2021   Symptomatic anemia 01/25/2021   Morbid obesity ([place]) 01/25/2021   Vaginal bleeding 01/25/2021   Iron deficiency anemia due to chronic blood loss 01/25/2021   Hypokalemia 01/25/2021   Facial swelling 11/17/2006   Headache(784.0) 05/19/2005   Stress at work 11/17/2003   Lower urinary tract infectious disease 10/16/1997   Knee pain 06/16/1996   Sinusitis 02/17/1996   Back pain 10/16/1992    Past Surgical History:  Procedure Laterality Date   COLONOSCOPY  04-2004   KNEE ARTHROSCOPY       OB History   No obstetric history on file.     Family History  Problem Relation Age of Onset   ALS Mother     Social History   Tobacco Use   Smoking status: Former    Types: Cigarettes   Smokeless tobacco: Never  Vaping Use   Vaping Use: Never used  Substance Use Topics   Alcohol use: Never   Drug use: Never    Home Medications Prior to Admission medications   Medication Sig Start Date End Date Taking? Authorizing  Provider  APPLE CIDER VINEGAR PO Take 1 tablet by mouth daily.    [provider]  EPINEPHrine (PRIMATENE MIST) 0.125 MG/ACT AERO Inhale 1 puff into the lungs daily as needed (wheezing).    [provider]  HYDROcodone-acetaminophen (NORCO) 5-325 MG tablet Take 1 tablet by mouth every 6 (six) hours as needed. 01/29/21   Jacqlyn Larsen, PA-C  ibuprofen (ADVIL) 200 MG tablet Take 400 mg by mouth every 6 (six) hours as needed for mild pain.    [provider]  Multiple Vitamin (MULTIVITAMIN WITH MINERALS) TABS tablet Take 1 tablet by mouth daily.    [provider]  Omega-3 Fatty Acids (FISH OIL) 1000 MG CAPS Take 1,000 mg by mouth daily.    [provider]  OVER THE COUNTER MEDICATION Take 1 capsule by mouth daily at 6 (six) AM. Beet root capsule    [provider]    Allergies    Patient has no known allergies.  Review of Systems   Review of Systems  Constitutional:  Negative for fever.  HENT:  Negative for ear pain.   Eyes:  Negative for pain.  Respiratory:  Positive for shortness of breath. Negative for cough.   Cardiovascular:  Positive for chest pain.  Gastrointestinal:  Positive for  abdominal pain.  Genitourinary:  Negative for flank pain.  Musculoskeletal:  Negative for back pain.  Skin:  Negative for rash.  Neurological:  Negative for headaches.   Physical Exam Updated Vital Signs BP 115/80    Pulse (!) 110    Temp 97.6 F (36.4 C) (Oral)    Resp 18    Ht _0  (1.753 m)    Wt (!) 158.8 kg    LMP  (LMP Unknown)    SpO2 100%    BMI 51.69 kg/m   Physical Exam Constitutional:      General: She is not in acute distress.    Appearance: Normal appearance.     Comments: BMI greater than 50  HENT:     Head: Normocephalic.     Nose: Nose normal.  Eyes:     Extraocular Movements: Extraocular movements intact.  Cardiovascular:     Rate and Rhythm: Normal rate.  Pulmonary:     Effort: Pulmonary effort is normal. No  respiratory distress.     Breath sounds: No wheezing.  Abdominal:     General: There is distension.     Comments: Lower abdomen appears diffusely indurated and swollen consistent with chronic dependent edema.  Musculoskeletal:        General: Normal range of motion.     Cervical back: Normal range of motion.     Right lower leg: Edema present.     Left lower leg: Edema present.  Neurological:     General: No focal deficit present.     Mental Status: She is alert. Mental status is at baseline.    ED Results / Procedures / Treatments   Labs (all labs ordered are listed, but only abnormal results are displayed) Labs Reviewed  CBC WITH DIFFERENTIAL/PLATELET - Abnormal; Notable for the following components:      Result Value   Hemoglobin 10.9 (*)    MCV 77.2 (*)    MCH 22.4 (*)    MCHC 29.1 (*)    RDW 21.7 (*)    All other components within normal limits  COMPREHENSIVE METABOLIC PANEL - Abnormal; Notable for the following components:   Calcium 8.7 (*)    Total Bilirubin 1.8 (*)    All other components within normal limits  BRAIN NATRIURETIC PEPTIDE - Abnormal; Notable for the following components:   B Natriuretic Peptide 322.0 (*)    All other components within normal limits  URINALYSIS, ROUTINE W REFLEX MICROSCOPIC - Abnormal; Notable for the following components:   Color, Urine AMBER (*)    APPearance CLOUDY (*)    Hgb urine dipstick LARGE (*)    Protein, ur >=300 (*)    Nitrite POSITIVE (*)    Leukocytes,Ua MODERATE (*)    RBC / HPF >50 (*)    WBC, UA >50 (*)    Bacteria, UA MANY (*)    All other components within normal limits  RESP PANEL BY RT-PCR (FLU A&B, COVID) ARPGX2  URINE CULTURE  TROPONIN I (HIGH SENSITIVITY)  TROPONIN I (HIGH SENSITIVITY)    EKG None  Radiology CT Abdomen Pelvis W Contrast  Result Date: 04/16/2021 CLINICAL DATA:  Abdominal pain EXAM: CT ABDOMEN AND PELVIS WITH CONTRAST TECHNIQUE: Multidetector CT imaging of the abdomen and pelvis was  performed using the standard protocol following bolus administration of intravenous contrast. CONTRAST:  138m OMNIPAQUE IOHEXOL 350 MG/ML SOLN COMPARISON:  07/13/2005 FINDINGS: Lower chest: Small bilateral pleural effusions are seen, more so on the right side. There are linear  patchy infiltrates in both lower lung fields, more so on the right side. Heart is enlarged in size. Hepatobiliary: Liver measures 21.9 cm in length. There is no dilation of bile ducts. Gallbladder is unremarkable. Pancreas: No focal abnormality is seen. Spleen: Unremarkable. Adrenals/Urinary Tract: Adrenals are unremarkable. There is no hydronephrosis. There is 9 mm calculus in the lower pole of left kidney. There are no demonstrable opaque ureteral stones. Urinary bladder is not distended. Stomach/Bowel: Stomach is not distended. Small bowel loops are not dilated. Appendix is not distinctly seen. There is no wall thickening in colon. Vascular/Lymphatic: There are scattered arterial calcifications. There are slightly enlarged lymph nodes in the retroperitoneum and mesentery. Reproductive: Unremarkable. Other: There is no pneumoperitoneum. Small ascites is present. There is diffuse edema in the subcutaneous plane in the abdominal wall. Small umbilical hernia containing fat is seen. Musculoskeletal: Degenerative changes are noted in the lumbar spine, more so at L5-S1 level. Degenerative changes are noted in the lower thoracic spine. IMPRESSION: There is no evidence intestinal obstruction or pneumoperitoneum. There is no hydronephrosis. There is 9 mm calculus in the lower pole of left kidney. Small ascites. There is diffuse edema in the subcutaneous plane in the abdominal wall suggesting anasarca. Small bilateral pleural effusions are seen, more so on the right side. There are patchy infiltrates in both lower lung fields suggesting atelectasis/pneumonia. Cardiomegaly. Other findings as described in the body of the report. Electronically Signed    By: Elmer Picker M.D.   On: 04/16/2021 19:46    Procedures Procedures   Medications Ordered in ED Medications  cefTRIAXone (ROCEPHIN) 1 g in sodium chloride 0.9 % 100 mL IVPB (has no administration in time range)  iohexol (OMNIPAQUE) 350 MG/ML injection 100 mL (100 mLs Intravenous Contrast Given 04/16/21 1915)  cefTRIAXone (ROCEPHIN) 1 g in sodium chloride 0.9 % 100 mL IVPB (0 g Intravenous Stopped 04/16/21 2130)    ED Course  I have reviewed the triage vital signs and the nursing notes.  Pertinent labs & imaging results that were available during my care of the patient were reviewed by me and considered in my medical decision making (see chart for details).    MDM Rules/Calculators/A&P                         Labs show normal white count hemoglobin 10.9 and near her baseline.  Chemistry unremarkable.  Urinalysis positive for UTI.  Viral panel was negative.  CT abdomen pelvis pursued showing evidence of anasarca no additional findings noted.  Patient given Rocephin here for her urinary tract infection.  She states that anasarca is gotten so bad that she cannot walk sputum more severe in the last 2 to 3 days.  Hospitalists have been consulted for admission.     Final Clinical Impression(s) / ED Diagnoses Final diagnoses:  Anasarca  Urinary tract infection with hematuria, site unspecified    Rx / DC Orders ED Discharge Orders     None        Luna Fuse, MD 04/16/21 2131

## 2021-04-16 NOTE — Assessment & Plan Note (Signed)
Follow up with nutrtion

## 2021-04-16 NOTE — Assessment & Plan Note (Signed)
echo

## 2021-04-16 NOTE — H&P (Signed)
Amber Herring ZOX:[id number] DOB: 1960/11/09 DOA: 04/16/2021    PCP: Denita Lung, MD   Outpatient Specialists:      Oncology  in the past Dr. Humphrey Rolls    Patient arrived to ER on 04/16/21 at 1439 Referred by Attending Luna Fuse, MD   Patient coming from: home Lives With family    Chief Complaint:   Chief Complaint  Patient presents with   Hematuria   Shortness of Breath    HPI: Amber Herring is a 60 y.o. 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 sleep study but kept getting up to pee Reports vaginal bleeding that comes and goes She stopped having periods 5 years ago But started to have some spotting Recently vaginal bleeding became more severe No fever  Has  been vaccinated against COVID  no flu shot   Initial COVID TEST  NEGATIVE   Lab Results  Component Value Date   [place] NEGATIVE 04/16/2021   [place] NEGATIVE 01/25/2021     Regarding pertinent Chronic problems:      Morbid obesity-   BMI Readings from Last 1 Encounters:  04/16/21 51.69 kg/m         Chronic anemia - baseline hg Hemoglobin & Hematocrit  Recent Labs    01/27/21 0530 01/27/21 1309 04/16/21 1721  HGB 7.9* 8.7* 10.9*    While in ER:   Evidence of anasarca and possible UTI     Ordered    CTabd/pelvis - diffuse edema in the subcutaneous plane in the abdominal wall suggesting anasarca.   Small bilateral pleural effusions are seen, more so on the right side. There are patchy infiltrates in both lower lung fields suggesting atelectasis/pneumonia. Cardiomegaly.    Following Medications were ordered in ER: Medications  cefTRIAXone (ROCEPHIN) 1 g in sodium chloride 0.9 % 100 mL IVPB (1 g Intravenous New Bag/Given 04/16/21 2056)   iohexol (OMNIPAQUE) 350 MG/ML injection 100 mL (100 mLs Intravenous Contrast Given 04/16/21 1915)       ED Triage Vitals  Enc Vitals Group     BP 04/16/21 1508 112/78     Pulse Rate 04/16/21 1508 (!) 111     Resp 04/16/21 1508 (!) 23     Temp 04/16/21 1508 97.6 F (36.4 C)     Temp Source 04/16/21 1508 Oral     SpO2 04/16/21 1507 92 %     Weight 04/16/21 1509 (!) 350 lb (158.8 kg)     Height 04/16/21 1509 _0  (1.753 m)     Head Circumference --      Peak Flow --      Pain Score 04/16/21 1509 0     Pain Loc --      Pain Edu? --      Excl. in [place]? --   TMAX(24)@     _________________________________________ Significant initial  Findings: Abnormal Labs Reviewed  CBC WITH DIFFERENTIAL/PLATELET - Abnormal; Notable for the following components:      Result Value   Hemoglobin 10.9 (*)    MCV 77.2 (*)    MCH 22.4 (*)    MCHC 29.1 (*)    RDW 21.7 (*)    All other components within normal limits  COMPREHENSIVE METABOLIC PANEL - Abnormal;  Notable for the following components:   Calcium 8.7 (*)    Total Bilirubin 1.8 (*)    All other components within normal limits  BRAIN NATRIURETIC PEPTIDE - Abnormal; Notable for the following components:   B Natriuretic Peptide 322.0 (*)    All other components within normal limits  URINALYSIS, ROUTINE W REFLEX MICROSCOPIC - Abnormal; Notable for the following components:   Color, Urine AMBER (*)    APPearance CLOUDY (*)    Hgb urine dipstick LARGE (*)    Protein, ur >=300 (*)    Nitrite POSITIVE (*)    Leukocytes,Ua MODERATE (*)    RBC / HPF >50 (*)    WBC, UA >50 (*)    Bacteria, UA MANY (*)    All other components within normal limits     _________________________ Troponin 12 ECG: Ordered Personally reviewed by me showing: HR : 111 Rhythm: Sinus tachycardia    no evidence of ischemic changes QTC 491     The recent clinical data is shown below. Vitals:   04/16/21 1940 04/16/21 2000 04/16/21 2030 04/16/21 2100  BP: (!)  124/94 (!) 122/92 118/83 115/80  Pulse: (!) 110 (!) 110 (!) 111 (!) 110  Resp: (!) _0 Temp:      TempSrc:      SpO2: 100% 98% 100% 100%  Weight:      Height:        WBC     Component Value Date/Time   WBC 5.1 04/16/2021 1721   LYMPHSABS 1.0 04/16/2021 1721   MONOABS 0.8 04/16/2021 1721   EOSABS 0.2 04/16/2021 1721   BASOSABS 0.1 04/16/2021 1721       UA   evidence of UTI      Urine analysis:    Component Value Date/Time   COLORURINE AMBER (A) 04/16/2021 1947   APPEARANCEUR CLOUDY (A) 04/16/2021 1947   LABSPEC 1.028 04/16/2021 1947   PHURINE 5.0 04/16/2021 1947   GLUCOSEU NEGATIVE 04/16/2021 1947   HGBUR LARGE (A) 04/16/2021 [place] NEGATIVE 04/16/2021 [place] NEGATIVE 04/16/2021 1947   PROTEINUR >=300 (A) 04/16/2021 1947   NITRITE POSITIVE (A) 04/16/2021 1947   LEUKOCYTESUR MODERATE (A) 04/16/2021 1947    Results for orders placed or performed during the hospital encounter of 04/16/21  Resp Panel by RT-PCR (Flu A&B, Covid) Nasopharyngeal Swab     Status: None   Collection Time: 04/16/21  5:20 PM   Specimen: Nasopharyngeal Swab; Nasopharyngeal(NP) swabs in vial transport medium  Result Value Ref Range Status   SARS Coronavirus 2 by RT PCR NEGATIVE NEGATIVE Final         Influenza A by PCR NEGATIVE NEGATIVE Final   Influenza B by PCR NEGATIVE NEGATIVE Final           _______________________________________________ Hospitalist was called for admission for UTI and anasarca  The following Work up has been ordered so far:  Orders Placed This Encounter  Procedures   Resp Panel by RT-PCR (Flu A&B, Covid) Nasopharyngeal Swab   CT Abdomen Pelvis W Contrast   CBC with Differential   Comprehensive metabolic panel   Brain natriuretic peptide   Urinalysis, Routine w reflex microscopic   Consult to hospitalist   ED EKG   EKG 12-Lead     OTHER Significant initial  Findings:  labs showing:    Recent Labs  Lab 04/16/21 1721  NA  139  K 3.7  CO2 27  GLUCOSE 79  BUN 14  CREATININE 0.78  CALCIUM 8.7*    Cr  stable,    Lab Results  Component Value Date   CREATININE 0.78 04/16/2021   CREATININE 0.78 01/27/2021   CREATININE 0.79 01/26/2021    Recent Labs  Lab 04/16/21 1721  AST 29  ALT 19  ALKPHOS 87  BILITOT 1.8*  PROT 7.5  ALBUMIN 3.5   Lab Results  Component Value Date   CALCIUM 8.7 (L) 04/16/2021    Plt: Lab Results  Component Value Date   PLT 315 04/16/2021      COVID-19 Labs  No results for input(s): DDIMER, FERRITIN, LDH, CRP in the last 72 hours.  Lab Results  Component Value Date   SARSCOV2NAA NEGATIVE 04/16/2021   [place] NEGATIVE 01/25/2021    Venous  Blood Gas result:  pH 7.410 pCO2 44.5      Recent Labs  Lab 04/16/21 1721  WBC 5.1  NEUTROABS 3.0  HGB 10.9*  HCT 37.5  MCV 77.2*  PLT 315    HG/HCT  stable,       Component Value Date/Time   HGB 10.9 (L) 04/16/2021 1721   HCT 37.5 04/16/2021 1721   MCV 77.2 (L) 04/16/2021 1721       Cardiac Panel (last 3 results) No results for input(s): CKTOTAL, CKMB, TROPONINI, RELINDX in the last 72 hours.  .car BNP (last 3 results) Recent Labs    01/25/21 1521 04/16/21 1722  BNP 182.4* 322.0*           Cultures: No results found for: SDES, SPECREQUEST, CULT, REPTSTATUS   Radiological Exams on Admission: US PELVIS (TRANSABDOMINAL ONLY)  Result Date: 04/17/2021 CLINICAL DATA:  Initial evaluation for vaginal bleeding. EXAM: TRANSABDOMINAL ULTRASOUND OF PELVIS TECHNIQUE: Transabdominal ultrasound examination of the pelvis was performed including evaluation of the uterus, ovaries, adnexal regions, and pelvic cul-de-sac. COMPARISON:  Prior CT from earlier the same day. FINDINGS: Examination technically limited by body habitus and patient's inability to position for the exam. Uterus Measurements: 9.3 x 3.1 x 5.7 cm = volume: A 7.6 mL. Uterus is anteverted. No visible discrete fibroid or other myometrial abnormality  on this limited exam. Endometrium Not visualized on this transabdominal only exam. Right ovary Not visualized.  No adnexal mass. Left ovary Not visualized.  No adnexal mass. Other findings: Moderate volume free fluid seen within the pelvis, corresponding with appearance on prior CT. IMPRESSION: 1. Technically limited exam with nonvisualization of the endometrium. If there remains clinical concern for possible underlying occult endometrial pathology, further assessment with dedicated sonohysterography could be performed for further evaluation as warranted. Correlation with dedicated pelvic MRI may potentially be helpful as well. 2. Grossly normal sonographic appearance of the uterus. 3. Nonvisualization of either ovary.  No adnexal mass. 4. Moderate volume free fluid within the pelvis, corresponding with appearance on prior CT. Electronically Signed   By: Jeannine Boga M.D.   On: 04/17/2021 00:46   CT Abdomen Pelvis W Contrast  Result Date: 04/16/2021 CLINICAL DATA:  Abdominal pain EXAM: CT ABDOMEN AND PELVIS WITH CONTRAST TECHNIQUE: Multidetector CT imaging of the abdomen and pelvis was performed using the standard protocol following bolus administration of intravenous contrast. CONTRAST:  177m OMNIPAQUE IOHEXOL 350 MG/ML SOLN COMPARISON:  07/13/2005 FINDINGS: Lower chest: Small bilateral pleural effusions are seen, more so on the right side. There are linear patchy infiltrates in both lower lung fields, more so on the right side. Heart is enlarged in size. Hepatobiliary: Liver measures 21.9 cm in length. There is no dilation  of bile ducts. Gallbladder is unremarkable. Pancreas: No focal abnormality is seen. Spleen: Unremarkable. Adrenals/Urinary Tract: Adrenals are unremarkable. There is no hydronephrosis. There is 9 mm calculus in the lower pole of left kidney. There are no demonstrable opaque ureteral stones. Urinary bladder is not distended. Stomach/Bowel: Stomach is not distended. Small bowel loops  are not dilated. Appendix is not distinctly seen. There is no wall thickening in colon. Vascular/Lymphatic: There are scattered arterial calcifications. There are slightly enlarged lymph nodes in the retroperitoneum and mesentery. Reproductive: Unremarkable. Other: There is no pneumoperitoneum. Small ascites is present. There is diffuse edema in the subcutaneous plane in the abdominal wall. Small umbilical hernia containing fat is seen. Musculoskeletal: Degenerative changes are noted in the lumbar spine, more so at L5-S1 level. Degenerative changes are noted in the lower thoracic spine. IMPRESSION: There is no evidence intestinal obstruction or pneumoperitoneum. There is no hydronephrosis. There is 9 mm calculus in the lower pole of left kidney. Small ascites. There is diffuse edema in the subcutaneous plane in the abdominal wall suggesting anasarca. Small bilateral pleural effusions are seen, more so on the right side. There are patchy infiltrates in both lower lung fields suggesting atelectasis/pneumonia. Cardiomegaly. Other findings as described in the body of the report. Electronically Signed   By: Elmer Picker M.D.   On: 04/16/2021 19:46   DG CHEST PORT 1 VIEW  Result Date: 04/16/2021 CLINICAL DATA:  Hematuria x2 days and SOB off and on for 5 days, she also states that blood may be coming out of her vagina. Pt denies dysuria. EXAM: PORTABLE CHEST 1 VIEW COMPARISON:  Chest x-ray 01/25/2021 FINDINGS: Enlarged cardiac silhouette. The heart and mediastinal contours are unchanged. Bilateral mid to lower lung zone airspace opacities. No pulmonary edema. No pleural effusion. No pneumothorax. No acute osseous abnormality. IMPRESSION: 1. Persistent enlarged cardiac silhouette with underlying pericardial effusion not excluded. 2. Bilateral mid to lower lung airspace opacities that may represent a combination of atelectasis, infection, inflammation. Electronically Signed   By: Iven Finn M.D.   On:  04/16/2021 23:55   _______________________________________________________________________________________________________ Latest   Blood pressure 115/80, pulse (!) 110, temperature 97.6 F (36.4 C), temperature source Oral, resp. rate 18, height _0  (1.753 m), weight (!) 158.8 kg, SpO2 100 %.   Vitals  labs and radiology finding personally reviewed  Review of Systems:    Pertinent, change in color of urine positives include:  fatigue, vaginal bleeding  abdominal pain,  shortness of breath at rest. dyspnea on exertion, chest pain,  Constitutional:  No weight loss, night sweats, Fevers, chills, weight loss  HEENT:  No headaches, Difficulty swallowing,Tooth/dental problems,Sore throat,  No sneezing, itching, ear ache, nasal congestion, post nasal drip,  Cardio-vascular:  No Orthopnea, PND, anasarca, dizziness, palpitations.no Bilateral lower extremity swelling  GI:  No heartburn, indigestion, nausea, vomiting, diarrhea, change in bowel habits, loss of appetite, melena, blood in stool, hematemesis Resp:   No excess mucus, no productive cough, No non-productive cough, No coughing up of blood.No change in color of mucus.No wheezing. Skin:  no rash or lesions. No jaundice GU:  no dysuria, no urgency or frequency. No straining to urinate.  No flank pain.  Musculoskeletal:  No joint pain or no joint swelling. No decreased range of motion. No back pain.  Psych:  No change in mood or affect. No depression or anxiety. No memory loss.  Neuro: no localizing neurological complaints, no tingling, no weakness, no double vision, no gait abnormality, no slurred speech, no  confusion  All systems reviewed and apart from [place] all are negative _______________________________________________________________________________________________ Past Medical History:   Past Medical History:  Diagnosis Date   Allergy    Anemia    iron defienency   Anxiety    GERD (gastroesophageal reflux disease)     Hyperlipidemia    Impaired fasting glucose    Metabolic syndrome    Obesity      Past Surgical History:  Procedure Laterality Date   COLONOSCOPY  04-2004   KNEE ARTHROSCOPY      Social History:  Ambulatory   cane      reports that she has quit smoking. Her smoking use included cigarettes. She has never used smokeless tobacco. She reports that she does not drink alcohol and does not use drugs.    Family History:   Family History  Problem Relation Age of Onset   ALS Mother    ______________________________________________________________________________________________ Allergies: No Known Allergies   Prior to Admission medications   Medication Sig Start Date End Date Taking? Authorizing Provider  APPLE CIDER VINEGAR PO Take 1 tablet by mouth daily.    [provider]  EPINEPHrine (PRIMATENE MIST) 0.125 MG/ACT AERO Inhale 1 puff into the lungs daily as needed (wheezing).    [provider]  HYDROcodone-acetaminophen (NORCO) 5-325 MG tablet Take 1 tablet by mouth every 6 (six) hours as needed. 01/29/21   Jacqlyn Larsen, PA-C  ibuprofen (ADVIL) 200 MG tablet Take 400 mg by mouth every 6 (six) hours as needed for mild pain.    [provider]  Multiple Vitamin (MULTIVITAMIN WITH MINERALS) TABS tablet Take 1 tablet by mouth daily.    [provider]  Omega-3 Fatty Acids (FISH OIL) 1000 MG CAPS Take 1,000 mg by mouth daily.    [provider]  OVER THE COUNTER MEDICATION Take 1 capsule by mouth daily at 6 (six) AM. Beet root capsule    [provider]    ___________________________________________________________________________________________________ Physical Exam: Vitals with BMI 04/16/2021 04/16/2021 04/16/2021  Height - - -  Weight - - -  BMI - - -  Systolic 765 465 035  Diastolic 80 83 92  Pulse 465 111 110    1. General:  in No  Acute distress    Chronically ill  -appearing 2. Psychological: Alert and   Oriented 3. Head/ENT:   Moist  Mucous Membranes                          Head Non traumatic, neck supple                         Poor Dentition 4. SKIN: normal  Skin turgor,  Skin clean Dry and intact no rash 5. Heart: Regular rate and rhythm no  Murmur, no Rub or gallop 6. Lungs:  no wheezes or crackles   7. Abdomen: Soft,  non-tender, distended   obese  bowel sounds present 8. Lower extremities: no clubbing, cyanosis, edema 9. Neurologically Grossly intact, moving all 4 extremities equally   10. MSK: Normal range of motion    Chart has been reviewed  ______________________________________________________________________________________________  Assessment/Plan   60 y.o. female with medical history significant of Obesity , depression anemia     Admitted for UTI anasraca, cardiomegaly possible CAP  Present on Admission:  Anasarca  Lower urinary tract infectious disease  Morbid obesity ([place])  Vaginal bleeding  Iron deficiency anemia due to chronic blood  loss  Cardiomegaly  Chest pain  Atrial flutter (HCC)  CAP (community acquired pneumonia)     Anasarca echo  Lower urinary tract infectious disease  - treat with Rocephin         await results of urine culture and adjust antibiotic coverage as needed   Morbid obesity ([place]) Follow up with nutrtion  Cardiomegaly Order echo    Vaginal bleeding Will need obGYN follow up  order pelvic US After DC  Chest pain Intermittent occures with exertion  Trop neg ECG non ischemic Order echo d.dimer if post will need CTA   Atrial flutter [facility]) Discussed with cardiology rec BB Obtain echo No anticoagulation unless evidence of PE/DVT then can weigh risk versus benefits. Patient has recurrent vaginal bleeding and anemia requiring blood transfusions. Cardiology will see in AM. Check D.dimer if positive will need cta  Iron deficiency anemia due to chronic blood loss Anemia panel , iron deficiency anemia secondary to  chronic vaginal bleeding. Transfuse as needed. Will need to have follow-up with OB/GYN  CAP (community acquired pneumonia) Possible pulmonary infiltrates no white blood cell count no fever or diarrhea.  Patient does endorse some dyspnea.  For tonight we will cover can downgrade if procalcitonin is unremarkable and no further evidence of bacterial infection  Debility - pt ot assessment   Other plan as per orders.  DVT prophylaxis:  SCD     Code Status:    Code Status: Prior FULL CODE  as per patient   I had personally discussed CODE STATUS with patient     Family Communication:   Family not at  Bedside    Disposition Plan:     likely will need placement for rehabilitation                             Following barriers for discharge:                            Electrolytes corrected                               Anemia stable                              Will need to be able to tolerate PO                            Will likely need home health, home O2, set up                           Will need consultants to evaluate patient prior to discharge                        Would benefit from PT/OT eval prior to DC  Ordered                   Swallow eval - SLP ordered                   Diabetes care coordinator                   Transition of care consulted  Nutrition    consulted                  Wound care  consulted                   Palliative care    consulted                   Behavioral health  consulted                    Consults called: cardiology is aware  Admission status:  ED Disposition     ED Disposition  Admit   Condition  --   [place]: [place] [[id number]]  Level of Care: Telemetry [5]  Admit to tele based on following criteria: Monitor for Ischemic changes  May place patient in observation at [facility] or [place] if equivalent level of care is available:: No  Covid Evaluation: Confirmed COVID  Negative  Diagnosis: Anasarca [[id number]]  Admitting Physician: Toy Baker [[id number]]  Attending Physician: Toy Baker [[id number]]           Obs        Level of care     tele  For 12H      Lab Results  Component Value Date   [place] 04/16/2021     Precautions: admitted as   Covid Negative      Yamilee Harmes 04/17/2021, 1:39 AM    [facility]     after 2 AM please page floor coverage PA If 7AM-7PM, please contact the day team taking care of the patient using [email or web address]   Patient was evaluated in the context of the global COVID-19 pandemic, which necessitated consideration that the patient might be at risk for infection with the SARS-CoV-2 virus that causes COVID-19. Institutional protocols and algorithms that pertain to the evaluation of patients at risk for COVID-19 are in a state of rapid change based on information released by regulatory bodies including the CDC and federal and state organizations. These policies and algorithms were followed during the patient's care.

## 2021-04-16 NOTE — Assessment & Plan Note (Signed)
Will need obGYN follow up  order pelvic US After DC

## 2021-04-16 NOTE — ED Triage Notes (Signed)
Pt came from home via EMS c/o Hematuria x2 days and SOB off and on for 5 days, she also states that blood may be coming out of her vagina. Pt denies dysuria. Pt's legs are edematous and pt reports that this is more than usual and is unable to walk even using her cane. She states that this has been since this morning.

## 2021-04-17 ENCOUNTER — Observation Stay (HOSPITAL_COMMUNITY): Payer: Medicaid Other

## 2021-04-17 DIAGNOSIS — D500 Iron deficiency anemia secondary to blood loss (chronic): Secondary | ICD-10-CM

## 2021-04-17 DIAGNOSIS — I4892 Unspecified atrial flutter: Secondary | ICD-10-CM | POA: Diagnosis present

## 2021-04-17 DIAGNOSIS — R601 Generalized edema: Secondary | ICD-10-CM | POA: Diagnosis present

## 2021-04-17 DIAGNOSIS — N939 Abnormal uterine and vaginal bleeding, unspecified: Secondary | ICD-10-CM

## 2021-04-17 DIAGNOSIS — L8990 Pressure ulcer of unspecified site, unspecified stage: Secondary | ICD-10-CM | POA: Insufficient documentation

## 2021-04-17 DIAGNOSIS — I2723 Pulmonary hypertension due to lung diseases and hypoxia: Secondary | ICD-10-CM | POA: Diagnosis present

## 2021-04-17 DIAGNOSIS — J189 Pneumonia, unspecified organism: Secondary | ICD-10-CM | POA: Diagnosis present

## 2021-04-17 DIAGNOSIS — Z20822 Contact with and (suspected) exposure to covid-19: Secondary | ICD-10-CM | POA: Diagnosis present

## 2021-04-17 DIAGNOSIS — E662 Morbid (severe) obesity with alveolar hypoventilation: Secondary | ICD-10-CM | POA: Diagnosis present

## 2021-04-17 DIAGNOSIS — I5043 Acute on chronic combined systolic (congestive) and diastolic (congestive) heart failure: Secondary | ICD-10-CM | POA: Diagnosis present

## 2021-04-17 DIAGNOSIS — I517 Cardiomegaly: Secondary | ICD-10-CM

## 2021-04-17 DIAGNOSIS — L89152 Pressure ulcer of sacral region, stage 2: Secondary | ICD-10-CM | POA: Diagnosis present

## 2021-04-17 DIAGNOSIS — R609 Edema, unspecified: Secondary | ICD-10-CM

## 2021-04-17 LAB — CBC WITH DIFFERENTIAL/PLATELET
Abs Immature Granulocytes: 0.01 10*3/uL (ref 0.00–0.07)
Basophils Absolute: 0.1 10*3/uL (ref 0.0–0.1)
Basophils Relative: 1 %
Eosinophils Absolute: 0.2 10*3/uL (ref 0.0–0.5)
Eosinophils Relative: 5 %
HCT: 35.7 % — ABNORMAL LOW (ref 36.0–46.0)
Hemoglobin: 10.4 g/dL — ABNORMAL LOW (ref 12.0–15.0)
Immature Granulocytes: 0 %
Lymphocytes Relative: 21 %
Lymphs Abs: 1 10*3/uL (ref 0.7–4.0)
MCH: 22.5 pg — ABNORMAL LOW (ref 26.0–34.0)
MCHC: 29.1 g/dL — ABNORMAL LOW (ref 30.0–36.0)
MCV: 77.3 fL — ABNORMAL LOW (ref 80.0–100.0)
Monocytes Absolute: 0.7 10*3/uL (ref 0.1–1.0)
Monocytes Relative: 15 %
Neutro Abs: 2.7 10*3/uL (ref 1.7–7.7)
Neutrophils Relative %: 58 %
Platelets: 323 10*3/uL (ref 150–400)
RBC: 4.62 MIL/uL (ref 3.87–5.11)
RDW: 21.4 % — ABNORMAL HIGH (ref 11.5–15.5)
WBC: 4.7 10*3/uL (ref 4.0–10.5)
nRBC: 0 % (ref 0.0–0.2)

## 2021-04-17 LAB — IRON AND TIBC
Iron: 19 ug/dL — ABNORMAL LOW (ref 28–170)
Saturation Ratios: 5 % — ABNORMAL LOW (ref 10.4–31.8)
TIBC: 394 ug/dL (ref 250–450)
UIBC: 375 ug/dL

## 2021-04-17 LAB — MAGNESIUM: Magnesium: 1.8 mg/dL (ref 1.7–2.4)

## 2021-04-17 LAB — COMPREHENSIVE METABOLIC PANEL
ALT: 16 U/L (ref 0–44)
AST: 24 U/L (ref 15–41)
Albumin: 3.3 g/dL — ABNORMAL LOW (ref 3.5–5.0)
Alkaline Phosphatase: 77 U/L (ref 38–126)
Anion gap: 6 (ref 5–15)
BUN: 14 mg/dL (ref 6–20)
CO2: 27 mmol/L (ref 22–32)
Calcium: 8.6 mg/dL — ABNORMAL LOW (ref 8.9–10.3)
Chloride: 107 mmol/L (ref 98–111)
Creatinine, Ser: 0.71 mg/dL (ref 0.44–1.00)
GFR, Estimated: >60 mL/min (ref >60)
Glucose, Bld: 96 mg/dL (ref 70–99)
Potassium: 3.7 mmol/L (ref 3.5–5.1)
Sodium: 140 mmol/L (ref 135–145)
Total Bilirubin: 1.3 mg/dL — ABNORMAL HIGH (ref 0.3–1.2)
Total Protein: 7 g/dL (ref 6.5–8.1)

## 2021-04-17 LAB — ECHOCARDIOGRAM COMPLETE
Calc EF: 46.2 %
Height: 69 in
MV M vel: 4.34 m/s
MV Peak grad: 75.2 mmHg
Radius: 0.35 cm
S' Lateral: 4.4 cm
Single Plane A2C EF: 42.1 %
Single Plane A4C EF: 50 %
Weight: 5600 oz

## 2021-04-17 LAB — PHOSPHORUS: Phosphorus: 4.2 mg/dL (ref 2.5–4.6)

## 2021-04-17 LAB — BLOOD GAS, VENOUS
Acid-Base Excess: 3.1 mmol/L — ABNORMAL HIGH (ref 0.0–2.0)
Bicarbonate: 27.6 mmol/L (ref 20.0–28.0)
O2 Saturation: 85.1 %
Patient temperature: 98.6
pCO2, Ven: 44.5 mmHg (ref 44.0–60.0)
pH, Ven: 7.41 (ref 7.250–7.430)
pO2, Ven: 54.4 mmHg — ABNORMAL HIGH (ref 32.0–45.0)

## 2021-04-17 LAB — RETICULOCYTES
Immature Retic Fract: 24.6 % — ABNORMAL HIGH (ref 2.3–15.9)
RBC.: 4.62 MIL/uL (ref 3.87–5.11)
Retic Count, Absolute: 38.3 10*3/uL (ref 19.0–186.0)
Retic Ct Pct: 0.8 % (ref 0.4–3.1)

## 2021-04-17 LAB — D-DIMER, QUANTITATIVE: D-Dimer, Quant: 1.62 ug/mL-FEU — ABNORMAL HIGH (ref 0.00–0.50)

## 2021-04-17 LAB — VITAMIN B12: Vitamin B-12: 899 pg/mL (ref 180–914)

## 2021-04-17 LAB — TSH: TSH: 2.511 u[IU]/mL (ref 0.350–4.500)

## 2021-04-17 LAB — PROCALCITONIN: Procalcitonin: <0.1 ng/mL

## 2021-04-17 LAB — FERRITIN: Ferritin: 8 ng/mL — ABNORMAL LOW (ref 11–307)

## 2021-04-17 LAB — FOLATE: Folate: 7.9 ng/mL (ref >5.9)

## 2021-04-17 MED ORDER — METOPROLOL TARTRATE 12.5 MG HALF TABLET
12.5000 mg | ORAL_TABLET | Freq: Two times a day (BID) | ORAL | Status: DC
  Administered 2021-04-17 (×3): 12.5 mg via ORAL
  Filled 2021-04-17 (×4): qty 1

## 2021-04-17 MED ORDER — TRAMADOL HCL 50 MG PO TABS
50.0000 mg | ORAL_TABLET | Freq: Four times a day (QID) | ORAL | Status: DC | PRN
  Administered 2021-04-19 – 2021-05-06 (×28): 50 mg via ORAL
  Filled 2021-04-17 (×29): qty 1

## 2021-04-17 MED ORDER — FUROSEMIDE 10 MG/ML IJ SOLN
40.0000 mg | Freq: Three times a day (TID) | INTRAMUSCULAR | Status: DC
  Administered 2021-04-17 – 2021-04-20 (×9): 40 mg via INTRAVENOUS
  Filled 2021-04-17 (×10): qty 4

## 2021-04-17 MED ORDER — PERFLUTREN LIPID MICROSPHERE
1.0000 mL | INTRAVENOUS | Status: AC | PRN
Start: 2021-04-17 — End: 2021-04-17
  Administered 2021-04-17: 2 mL via INTRAVENOUS
  Filled 2021-04-17: qty 10

## 2021-04-17 MED ORDER — SODIUM CHLORIDE 0.9 % IV SOLN
500.0000 mg | INTRAVENOUS | Status: DC
  Administered 2021-04-17: 500 mg via INTRAVENOUS
  Filled 2021-04-17: qty 5

## 2021-04-17 MED ORDER — OXYCODONE HCL 5 MG PO TABS
5.0000 mg | ORAL_TABLET | ORAL | Status: DC | PRN
  Administered 2021-04-17 – 2021-04-28 (×8): 5 mg via ORAL
  Filled 2021-04-17 (×9): qty 1

## 2021-04-17 NOTE — Progress Notes (Signed)
Code Status: FULL RONNETTA CURRINGTON is a 60 y.o. female patient admitted from ED awake, alert - oriented X4 - no acute distress noted. VSS -  no c/o shortness of breath, no c/o chest pain. Cardiac tele in place. Call light within reach, patient able to voice, and demonstrate understanding. Stage 2 pressure injury noted to left posterior upper thigh.  ?  Will cont to eval and treat per MD orders.  Melonie Florida, RN  04/17/2021 6:07 PM

## 2021-04-17 NOTE — Assessment & Plan Note (Signed)
Anemia panel , iron deficiency anemia secondary to chronic vaginal bleeding. Transfuse as needed. Will need to have follow-up with OB/GYN

## 2021-04-17 NOTE — Assessment & Plan Note (Signed)
Possible pulmonary infiltrates no white blood cell count no fever or diarrhea.  Patient does endorse some dyspnea.  For tonight we will cover can downgrade if procalcitonin is unremarkable and no further evidence of bacterial infection

## 2021-04-17 NOTE — Progress Notes (Signed)
Lower extremity venous has been completed.   Preliminary results in CV Proc.   Jinny Blossom Aalyah Mansouri 04/17/2021 2:50 PM

## 2021-04-17 NOTE — Evaluation (Signed)
Physical Therapy Evaluation Patient Details Name: Amber Herring MRN: [id number] DOB: June 23, 1960 Today's Date: 04/17/2021  History of Present Illness  Patient presents to the ER 04/16/21 with chief complaint of shortness of breath and swelling of her lower abdomen and bilateral lower extremities, anasarca. PMH: recent left hand fx-3 months ago), GERD, obesity, metabolic syndrome, anxiety.  Clinical Impression  Patient  presents on bariatric air bed, Assisted patient with rolling and repositioning, with 2 max assisting to wash up and change bed pads, Patient able to assist with rolling, able to move legs to assist. Unable to sit upright in bed due to edema  and body habitus.Did not attempt sitting on bed edge due to safety risks for air mattress and obesity.  Patient has  H/O left hand fracture x 3 months. Until  recently, independent   using a cane, able to negotiate 14 steps_(had OPOT appt. 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  Patient currently quite limited  in safe mobility for out of bed due to the air bed and patient body habitus/edema. Should consider a room with maxi sky or may need the tenor lift.  Pt admitted with above diagnosis.  Pt currently with functional limitations due to the deficits listed below (see PT Problem List). Pt will benefit from skilled PT to increase their independence and safety with mobility to allow discharge to the venue listed below.    HR 110- 121 , SPO2 94% RA.            Recommendations for follow up therapy are one component of a multi-disciplinary discharge planning process, led by the attending physician.  Recommendations may be updated based on patient status, additional functional criteria and insurance authorization.  Follow Up Recommendations Skilled nursing-short term rehab (<3 hours/day)    Assistance Recommended at Discharge Frequent or constant Supervision/Assistance  Functional Status Assessment Patient has  had a recent decline in their functional status and demonstrates the ability to make significant improvements in function in a reasonable and predictable amount of time.  Equipment Recommendations  Other (comment) (TBD)    Recommendations for Other Services       Precautions / Restrictions Precautions Precaution Comments: pt reports knee pain. Left  hand Required Braces or Orthoses: Splint/Cast Splint/Cast: has a splint for Left hand Restrictions Other Position/Activity Restrictions: encouraged to not pull on  rail with left hand      Mobility  Bed Mobility Overal bed mobility: Needs Assistance Bed Mobility: Rolling Rolling: +2 for physical assistance;+2 for safety/equipment;Max assist         General bed mobility comments: patient  able to reach for rail, able to left each leg to  move  forward to facilITE ROLLING. sitting not tested due to being on bari bed and not deemed safe, as well as edema/body habitus.    Transfers                   General transfer comment: Unable, on Air bari bed, will need lift    Ambulation/Gait                  Stairs            Wheelchair Mobility    Modified Rankin (Stroke Patients Only)       Balance  Pertinent Vitals/Pain Pain Assessment: Faces Faces Pain Scale: Hurts even more Pain Location: left knee Pain Descriptors / Indicators: Aching Pain Intervention(s): Monitored during session;Limited activity within patient's tolerance    Home Living Family/patient expects to be discharged to:: Private residence Living Arrangements: Children Available Help at Discharge: Family;Available PRN/intermittently Type of Home: Apartment Home Access: Stairs to enter Entrance Stairs-Rails: Psychiatric nurse of Steps: 14   Home Layout: One level Home Equipment: Cane - single point      Prior Function Prior Level of Function :  Independent/Modified Independent             Mobility Comments: until recently, independent with amb and ADL's since Left hand fx.  could negotiate steps but only when necessary. Prior to hand fx, was a big rig truck driver-3 months ago ADLs Comments: Indep.     Hand Dominance        Extremity/Trunk Assessment   Upper Extremity Assessment Upper Extremity Assessment: Defer to OT evaluation;LUE deficits/detail LUE Deficits / Details: has bruising volar forearm, brace on bedside table, moves at shoulder [facility]    Lower Extremity Assessment Lower Extremity Assessment: RLE deficits/detail;LLE deficits/detail RLE Deficits / Details: able to lift from bed when supine and on side, Fleses knee to ! 45*, body habitus limits LLE Deficits / Details: less able to lift the leg , reports knee pain    Cervical / Trunk Assessment Cervical / Trunk Assessment: Other exceptions Cervical / Trunk Exceptions: difficulty in sitting upright due to body habitus and severe ebdominal edema  Communication   Communication: No difficulties  Cognition Arousal/Alertness: Awake/alert Behavior During Therapy: WFL for tasks assessed/performed Overall Cognitive Status: Within Functional Limits for tasks assessed                                          General Comments General comments (skin integrity, edema, etc.): placed bed in as much chair position and  tilted  forward as possible  Patient able to pull self forward enough to adjust pillow , limited  with body habitus and LUE limitation /hand fx.    Exercises General Exercises - Lower Extremity Ankle Circles/Pumps: PROM;Both;10 reps Heel Slides: AAROM;Both;10 reps;Supine Straight Leg Raises: AAROM;Both;10 reps;Supine;Sidelying   Assessment/Plan    PT Assessment Patient needs continued PT services  PT Problem List Decreased strength;Decreased knowledge of precautions;Pain;Decreased range of motion;Decreased  mobility;Obesity;Cardiopulmonary status limiting activity;Decreased activity tolerance       PT Treatment Interventions DME instruction;Functional mobility training;Patient/family education;Gait training;Therapeutic activities;Therapeutic exercise    PT Goals (Current goals can be found in the Care Plan section)  Acute Rehab 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 PT Goal Formulation: With patient Time For Goal Achievement: 05/01/21 Potential to Achieve Goals: Fair    Frequency Min 2X/week   Barriers to discharge Decreased caregiver support;Inaccessible home environment 2nd level apt    Co-evaluation               AM-PAC PT "6 Clicks" Mobility  Outcome Measure Help needed turning from your back to your side while in a flat bed without using bedrails?: A Lot Help needed moving from lying on your back to sitting on the side of a flat bed without using bedrails?: Total Help needed moving to and from a bed to a chair (including a wheelchair)?: Total Help needed standing up from a chair using your arms (  e.g., wheelchair or bedside chair)?: Total Help needed to walk in hospital room?: Total Help needed climbing 3-5 steps with a railing? : Total 6 Click Score: 7    End of Session   Activity Tolerance: Patient tolerated treatment well;Treatment limited secondary to medical complications (Comment) (body habitus and edema) Patient left: in bed;with call bell/phone within reach Nurse Communication: Mobility status;Need for lift equipment PT Visit Diagnosis: Unsteadiness on feet (R26.81);Difficulty in walking, not elsewhere classified (R26.2);Pain Pain - Right/Left: Left Pain - part of body: Knee    Time: 0826-0916 PT Time Calculation (min) (ACUTE ONLY): 50 min   Charges:   PT Evaluation $PT Eval Moderate Complexity: 1 Mod PT Treatments $Therapeutic Activity: 23-37 mins        Tresa Endo PT Acute Rehabilitation Services Pager [phone] Office  [phone]   Claretha Cooper 04/17/2021, 12:05 PM

## 2021-04-17 NOTE — Progress Notes (Signed)
°   04/17/21 1723  Assess: MEWS Score  Temp 98.1 F (36.7 C)  BP 118/75  Pulse Rate (!) 111  ECG Heart Rate (!) 112  Resp 20  SpO2 95 %  O2 Device Room Air  Assess: MEWS Score  MEWS Temp 0  MEWS Systolic 0  MEWS Pulse 2  MEWS RR 0  MEWS LOC 0  MEWS Score 2  MEWS Score Color Yellow  Assess: if the MEWS score is Yellow or Red  Were vital signs taken at a resting state? Yes  Focused Assessment No change from prior assessment  Does the patient meet 2 or more of the SIRS criteria? No  MEWS guidelines implemented *See Row Information* Yes  Treat  MEWS Interventions Escalated (See documentation below)  Pain Scale 0-10  Pain Score 3  Pain Type Acute pain  Pain Location Hand  Pain Orientation Left  Pain Descriptors / Indicators Aching  Pain Frequency Constant  Take Vital Signs  Increase Vital Sign Frequency  Yellow: Q 2hr X 2 then Q 4hr X 2, if remains yellow, continue Q 4hrs  Escalate  MEWS: Escalate Yellow: discuss with charge nurse/RN and consider discussing with provider and RRT  Notify: Charge Nurse/RN  Name of Charge Nurse/RN Notified Wendy, RN  Date Charge Nurse/RN Notified 04/17/21  Time Charge Nurse/RN Notified 1815  Document  Patient Outcome Other (Comment) (Patient stable.)  Progress note created (see row info) Yes  Assess: SIRS CRITERIA  SIRS Temperature  0  SIRS Pulse 1  SIRS Respirations  0  SIRS WBC 0  SIRS Score Sum  1

## 2021-04-17 NOTE — Assessment & Plan Note (Addendum)
Discussed with cardiology rec BB Obtain echo No anticoagulation unless evidence of PE/DVT then can weigh risk versus benefits. Patient has recurrent vaginal bleeding and anemia requiring blood transfusions. Cardiology will see in AM. Check D.dimer if positive will need cta

## 2021-04-17 NOTE — Progress Notes (Signed)
Amber Herring  JYN:[id number] DOB: 1960-09-29 DOA: 04/16/2021 PCP: Denita Lung, MD    Brief Narrative:  60 year old with a history of morbid obesity, depression, chronic anemia, GERD, and HLD who presented to the ED with severe generalized swelling primarily of the legs and abdomen.  ROS was also positive for intermittent dysfunctional vaginal bleeding.  Consultants:  Cardiology  Code Status: FULL CODE  Antimicrobials:  Rocephin 12/30  Azithromycin 12/30   DVT prophylaxis: SCDs  Subjective: Resting comfortably in bed. No new complaints. No signif change in impressive swelling thus far. Denies cp or signif SOB at present.   Assessment & Plan:  Anasarca -chronic venous stasis TTE pending - aggressive IV diuresis - follow Is/Os and serial exam   Morbid obesity - Body mass index is 51.69 kg/m.  Vaginal bleeding Will need outpatient evaluation -monitor hemoglobin for now - limited pelvic US this admit without acute findings, but was not able to visualize all structures/endometrium well   Iron deficiency anemia Iron low at presentation with normal TIBC and ferritin of 8 - monitor Hgb during admit   Sinus Tachycardia versus ectopic atrial tachycardia Care per Cardiology - monitoring on tele   ?  UTI No clear sx to suggest a urinary tract infection - follow clinically w/o abx for now - f/u culture data  ?  PNA With normal procalcitonin, normal WBC, and pleural effusions I doubt this represents an infectious pneumonia - stop abx and monitor clinically    Family Communication: no family present at time of exam    Objective: Blood pressure 113/81, pulse (!) 111, temperature 97.8 F (36.6 C), temperature source Oral, resp. rate 12, height _0  (1.753 m), weight (!) 158.8 kg, SpO2 96 %.  Intake/Output Summary (Last 24 hours) at 04/17/2021 1210 Last data filed at 04/17/2021 0936 Gross per 24 hour  Intake 360 ml  Output --  Net 360 ml   Filed Weights   04/16/21  1509  Weight: (!) 158.8 kg    Examination: General: No acute respiratory distress Lungs: poor air movement B bases - no wheezing  Cardiovascular: distant HS - no appreciable M - RRR  Abdomen: morbidly obese - soft - BS+ - edema of abdom wall  Extremities: 3+ B LE to pelvis - change of venous stasis dermatitis B LE   CBC: Recent Labs  Lab 04/16/21 1721 04/17/21 0525  WBC 5.1 4.7  NEUTROABS 3.0 2.7  HGB 10.9* 10.4*  HCT 37.5 35.7*  MCV 77.2* 77.3*  PLT 315 865   Basic Metabolic Panel: Recent Labs  Lab 04/16/21 1721 04/17/21 0525  NA 139 140  K 3.7 3.7  CL 105 107  CO2 27 27  GLUCOSE 79 96  BUN 14 14  CREATININE 0.78 0.71  CALCIUM 8.7* 8.6*  MG  --  1.8  PHOS  --  4.2   GFR: Estimated Creatinine Clearance: 124.9 mL/min (by C-G formula based on SCr of 0.71 mg/dL).  Liver Function Tests: Recent Labs  Lab 04/16/21 1721 04/17/21 0525  AST 29 24  ALT 19 16  ALKPHOS 87 77  BILITOT 1.8* 1.3*  PROT 7.5 7.0  ALBUMIN 3.5 3.3*    Recent Results (from the past 240 hour(s))  Resp Panel by RT-PCR (Flu A&B, Covid) Nasopharyngeal Swab     Status: None   Collection Time: 04/16/21  5:20 PM   Specimen: Nasopharyngeal Swab; Nasopharyngeal(NP) swabs in vial transport medium  Result Value Ref Range Status   SARS Coronavirus 2 by  RT PCR NEGATIVE NEGATIVE Final    Comment: (NOTE) SARS-CoV-2 target nucleic acids are NOT DETECTED.  The SARS-CoV-2 RNA is generally detectable in upper respiratory specimens during the acute phase of infection. The lowest concentration of SARS-CoV-2 viral copies this assay can detect is 138 copies/mL. A negative result does not preclude SARS-Cov-2 infection and should not be used as the sole basis for treatment or other patient management decisions. A negative result may occur with  improper specimen collection/handling, submission of specimen other than nasopharyngeal swab, presence of viral mutation(s) within the areas targeted by this assay,  and inadequate number of viral copies(<138 copies/mL). A negative result must be combined with clinical observations, patient history, and epidemiological information. The expected result is Negative.  Fact Sheet for Patients:  EntrepreneurPulse.com.au  Fact Sheet for Healthcare Providers:  IncredibleEmployment.be  This test is no t yet approved or cleared by the [place] FDA and  has been authorized for detection and/or diagnosis of SARS-CoV-2 by FDA under an Emergency Use Authorization (EUA). This EUA will remain  in effect (meaning this test can be used) for the duration of the COVID-19 declaration under Section 564(b)(1) of the Act, 21 U.S.C.section 360bbb-3(b)(1), unless the authorization is terminated  or revoked sooner.       Influenza A by PCR NEGATIVE NEGATIVE Final   Influenza B by PCR NEGATIVE NEGATIVE Final    Comment: (NOTE) The Xpert Xpress SARS-CoV-2/FLU/RSV plus assay is intended as an aid in the diagnosis of influenza from Nasopharyngeal swab specimens and should not be used as a sole basis for treatment. Nasal washings and aspirates are unacceptable for Xpert Xpress SARS-CoV-2/FLU/RSV testing.  Fact Sheet for Patients: EntrepreneurPulse.com.au  Fact Sheet for Healthcare Providers: IncredibleEmployment.be  This test is not yet approved or cleared by the [place] FDA and has been authorized for detection and/or diagnosis of SARS-CoV-2 by FDA under an Emergency Use Authorization (EUA). This EUA will remain in effect (meaning this test can be used) for the duration of the COVID-19 declaration under Section 564(b)(1) of the Act, 21 U.S.C. section 360bbb-3(b)(1), unless the authorization is terminated or revoked.  Performed at [facility], [place] [address], [place],  [address]      Scheduled Meds:  furosemide  40 mg Intravenous TID   metoprolol  tartrate  12.5 mg Oral BID   sodium chloride flush  3 mL Intravenous Q12H   Continuous Infusions:  sodium chloride     azithromycin Stopped (04/17/21 0320)   cefTRIAXone (ROCEPHIN)  IV       LOS: 0 days   Cherene Altes, MD [facility] Office  [phone] Pager - Text Page per Shea Evans  If 7PM-7AM, please contact night-coverage per Amion 04/17/2021, 12:10 PM

## 2021-04-17 NOTE — Consult Note (Signed)
CONSULTATION NOTE   Patient Name: Amber Herring Date of Encounter: 04/17/2021 Cardiologist: None Electrophysiologist: None Advanced Heart Failure: None   Chief Complaint   Anasarca  Patient Profile   60 yo female with history of obesity, depression and anemia presented with generalized swelling and cardiomegaly concerning for heart failure as well as atrial flutter on admission  HPI   Amber Herring is a 60 y.o. female who is being seen today for the evaluation of CHF/atrial flutter at the request of Dr. Roel Cluck. 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  She is reported chest pain and worsening shortness of breath and has had persistent swelling and weight gain.  She also recently fractured her left hand and is recovering from that.  On admission was thought to be in atrial flutter, however I suspect this is a sinus tachycardia with rate around 110.  Chest x-ray showed an enlarged cardiac silhouette with mild bilateral airspace opacities.  CT abdomen pelvis showed bilateral pleural effusions and cardiomegaly.  Initial BNP was elevated at 322, troponin was negative x2 at 13 and 12. Echocardiogram has been ordered.  PMHx   Past Medical History:  Diagnosis Date   Allergy    Anemia    iron defienency   Anxiety    GERD (gastroesophageal reflux disease)    Hyperlipidemia    Impaired fasting glucose    Metabolic syndrome    Obesity     Past Surgical History:  Procedure Laterality Date   COLONOSCOPY  04-2004   KNEE ARTHROSCOPY      FAMHx   Family History  Problem Relation Age of Onset   ALS Mother     SOCHx    reports that she has quit smoking. Her smoking use included cigarettes. She has never used smokeless tobacco. She reports that she does not drink alcohol and does not use drugs.  Outpatient Medications   No current facility-administered medications on file prior to encounter.   Current  Outpatient Medications on File Prior to Encounter  Medication Sig Dispense Refill   APPLE CIDER VINEGAR PO Take 1 tablet by mouth every morning. gummies     EPINEPHrine (PRIMATENE MIST) 0.125 MG/ACT AERO Inhale 1 puff into the lungs daily as needed (wheezing).     ibuprofen (ADVIL) 200 MG tablet Take 400 mg by mouth every 6 (six) hours as needed for mild pain.     Multiple Vitamins-Minerals (ADULT ONE DAILY GUMMIES) CHEW Chew 2 tablets by mouth every morning.     OVER THE COUNTER MEDICATION Take 1 capsule by mouth 2 (two) times daily. Beet root capsule     OVER THE COUNTER MEDICATION Take 2 tablets by mouth every morning. Omega XL - purchased online     OVER THE COUNTER MEDICATION Apply 1 application topically 3 (three) times daily as needed (knee pain). Otc foam for knee pain      Inpatient Medications    Scheduled Meds:  metoprolol tartrate  12.5 mg Oral BID   sodium chloride flush  3 mL Intravenous Q12H    Continuous Infusions:  sodium chloride     azithromycin Stopped (04/17/21 0320)   cefTRIAXone (ROCEPHIN)  IV      PRN Meds: sodium chloride, acetaminophen **OR** acetaminophen, HYDROcodone-acetaminophen, sodium chloride flush   ALLERGIES   No Known Allergies  ROS   Pertinent items noted in HPI and remainder of comprehensive ROS otherwise negative.  Vitals   Vitals:   04/17/21  0000 04/17/21 0225 04/17/21 0531 04/17/21 0925  BP: 127/73 118/78 117/68 113/81  Pulse: (!) 114 (!) 114 (!) 111 (!) 111  Resp: (!) _0 Temp:      TempSrc:      SpO2: 94% 96% 97% 96%  Weight:      Height:        Intake/Output Summary (Last 24 hours) at 04/17/2021 1324 Last data filed at 04/17/2021 4010 Gross per 24 hour  Intake 360 ml  Output --  Net 360 ml   Filed Weights   04/16/21 1509  Weight: (!) 158.8 kg    Physical Exam   General appearance: alert, no distress, morbidly obese, and anasarca Neck: JVD - several cm above sternal notch, no carotid bruit, and thyroid  not enlarged, symmetric, no tenderness/mass/nodules Lungs: diminished breath sounds bibasilar Heart: Regular tachycardia Abdomen: Morbidly obese Extremities: edema 4+ bilateral lower extremity, firm with chronic venous stasis changes Pulses: 2+ and symmetric Skin: Chronic lower extremity venous stasis changes, multiple pigmented nevi over her face Neurologic: Grossly normal Psych: Pleasant  Labs   Results for orders placed or performed during the hospital encounter of 04/16/21 (from the past 48 hour(s))  Resp Panel by RT-PCR (Flu A&B, Covid) Nasopharyngeal Swab     Status: None   Collection Time: 04/16/21  5:20 PM   Specimen: Nasopharyngeal Swab; Nasopharyngeal(NP) swabs in vial transport medium  Result Value Ref Range   SARS Coronavirus 2 by RT PCR NEGATIVE NEGATIVE    Comment: (NOTE) SARS-CoV-2 target nucleic acids are NOT DETECTED.  The SARS-CoV-2 RNA is generally detectable in upper respiratory specimens during the acute phase of infection. The lowest concentration of SARS-CoV-2 viral copies this assay can detect is 138 copies/mL. A negative result does not preclude SARS-Cov-2 infection and should not be used as the sole basis for treatment or other patient management decisions. A negative result may occur with  improper specimen collection/handling, submission of specimen other than nasopharyngeal swab, presence of viral mutation(s) within the areas targeted by this assay, and inadequate number of viral copies(<138 copies/mL). A negative result must be combined with clinical observations, patient history, and epidemiological information. The expected result is Negative.  Fact Sheet for Patients:  EntrepreneurPulse.com.au  Fact Sheet for Healthcare Providers:  IncredibleEmployment.be  This test is no t yet approved or cleared by the [place] FDA and  has been authorized for detection and/or diagnosis of SARS-CoV-2 by FDA under an  Emergency Use Authorization (EUA). This EUA will remain  in effect (meaning this test can be used) for the duration of the COVID-19 declaration under Section 564(b)(1) of the Act, 21 U.S.C.section 360bbb-3(b)(1), unless the authorization is terminated  or revoked sooner.       Influenza A by PCR NEGATIVE NEGATIVE   Influenza B by PCR NEGATIVE NEGATIVE    Comment: (NOTE) The Xpert Xpress SARS-CoV-2/FLU/RSV plus assay is intended as an aid in the diagnosis of influenza from Nasopharyngeal swab specimens and should not be used as a sole basis for treatment. Nasal washings and aspirates are unacceptable for Xpert Xpress SARS-CoV-2/FLU/RSV testing.  Fact Sheet for Patients: EntrepreneurPulse.com.au  Fact Sheet for Healthcare Providers: IncredibleEmployment.be  This test is not yet approved or cleared by the [place] FDA and has been authorized for detection and/or diagnosis of SARS-CoV-2 by FDA under an Emergency Use Authorization (EUA). This EUA will remain in effect (meaning this test can be used) for the duration of the COVID-19 declaration under Section 564(b)(1) of  the Act, 21 U.S.C. section 360bbb-3(b)(1), unless the authorization is terminated or revoked.  Performed at [facility], [place] [address] [place], [place] [address]   CBC with Differential     Status: Abnormal   Collection Time: 04/16/21  5:21 PM  Result Value Ref Range   WBC 5.1 4.0 - 10.5 K/uL   RBC 4.86 3.87 - 5.11 MIL/uL   Hemoglobin 10.9 (L) 12.0 - 15.0 g/dL   HCT 37.5 36.0 - 46.0 %   MCV 77.2 (L) 80.0 - 100.0 fL   MCH 22.4 (L) 26.0 - 34.0 pg   MCHC 29.1 (L) 30.0 - 36.0 g/dL   RDW 21.7 (H) 11.5 - 15.5 %   Platelets 315 150 - 400 K/uL   nRBC 0.0 0.0 - 0.2 %   Neutrophils Relative % 58 %   Neutro Abs 3.0 1.7 - 7.7 K/uL   Lymphocytes Relative 20 %   Lymphs Abs 1.0 0.7 - 4.0 K/uL   Monocytes Relative 15 %   Monocytes Absolute 0.8 0.1 - 1.0 K/uL    Eosinophils Relative 5 %   Eosinophils Absolute 0.2 0.0 - 0.5 K/uL   Basophils Relative 2 %   Basophils Absolute 0.1 0.0 - 0.1 K/uL   Immature Granulocytes 0 %   Abs Immature Granulocytes 0.01 0.00 - 0.07 K/uL   Acanthocytes PRESENT    Burr Cells PRESENT    Target Cells PRESENT     Comment: Performed at [facility], [place] [address] [place], [place] [address]  Comprehensive metabolic panel     Status: Abnormal   Collection Time: 04/16/21  5:21 PM  Result Value Ref Range   Sodium 139 135 - 145 mmol/L   Potassium 3.7 3.5 - 5.1 mmol/L   Chloride 105 98 - 111 mmol/L   CO2 27 22 - 32 mmol/L   Glucose, Bld 79 70 - 99 mg/dL    Comment: Glucose reference range applies only to samples taken after fasting for at least 8 hours.   BUN 14 6 - 20 mg/dL   Creatinine, Ser 0.78 0.44 - 1.00 mg/dL   Calcium 8.7 (L) 8.9 - 10.3 mg/dL   Total Protein 7.5 6.5 - 8.1 g/dL   Albumin 3.5 3.5 - 5.0 g/dL   AST 29 15 - 41 U/L   ALT 19 0 - 44 U/L   Alkaline Phosphatase 87 38 - 126 U/L   Total Bilirubin 1.8 (H) 0.3 - 1.2 mg/dL   GFR, Estimated >60 >60 mL/min    Comment: (NOTE) Calculated using the CKD-EPI Creatinine Equation (2021)    Anion gap 7 5 - 15    Comment: Performed at [facility], [place] [address], [place], [place] [address]  Troponin I (High Sensitivity)     Status: None   Collection Time: 04/16/21  5:21 PM  Result Value Ref Range   Troponin I (High Sensitivity) 13 <18 ng/L    Comment: (NOTE) Elevated high sensitivity troponin I (hsTnI) values and significant  changes across serial measurements may suggest ACS but many other  chronic and acute conditions are known to elevate hsTnI results.  Refer to the "Links" section for chest pain algorithms and additional  guidance. Performed at [facility], [place] [address], [place], [place] [address]   Brain natriuretic peptide     Status: Abnormal   Collection Time: 04/16/21  5:22 PM  Result  Value Ref Range   B Natriuretic Peptide 322.0 (H) 0.0 - 100.0  pg/mL    Comment: Performed at [facility], [place] [address], [place], [place] [address]  Troponin I (High Sensitivity)     Status: None   Collection Time: 04/16/21  6:27 PM  Result Value Ref Range   Troponin I (High Sensitivity) 12 <18 ng/L    Comment: (NOTE) Elevated high sensitivity troponin I (hsTnI) values and significant  changes across serial measurements may suggest ACS but many other  chronic and acute conditions are known to elevate hsTnI results.  Refer to the "Links" section for chest pain algorithms and additional  guidance. Performed at [facility], [place] [address], [place], [place] [address]   Urinalysis, Routine w reflex microscopic Urine, Clean Catch     Status: Abnormal   Collection Time: 04/16/21  7:47 PM  Result Value Ref Range   Color, Urine AMBER (A) YELLOW    Comment: BIOCHEMICALS MAY BE AFFECTED BY COLOR   APPearance CLOUDY (A) CLEAR   Specific Gravity, Urine 1.028 1.005 - 1.030   pH 5.0 5.0 - 8.0   Glucose, UA NEGATIVE NEGATIVE mg/dL   Hgb urine dipstick LARGE (A) NEGATIVE   Bilirubin Urine NEGATIVE NEGATIVE   Ketones, ur NEGATIVE NEGATIVE mg/dL   Protein, ur >=300 (A) NEGATIVE mg/dL   Nitrite POSITIVE (A) NEGATIVE   Leukocytes,Ua MODERATE (A) NEGATIVE   RBC / HPF >50 (H) 0 - 5 RBC/hpf   WBC, UA >50 (H) 0 - 5 WBC/hpf   Bacteria, UA MANY (A) NONE SEEN   Squamous Epithelial / LPF 0-5 0 - 5   Mucus PRESENT    Ca Oxalate Crys, UA PRESENT     Comment: Performed at [facility], [place] [address], [place], [place] [address]  Blood gas, venous     Status: Abnormal   Collection Time: 04/16/21 11:59 PM  Result Value Ref Range   pH, Ven 7.410 7.250 - 7.430   pCO2, Ven 44.5 44.0 - 60.0 mmHg   pO2, Ven 54.4 (H) 32.0 - 45.0 mmHg   Bicarbonate 27.6 20.0 - 28.0 mmol/L   Acid-Base Excess 3.1 (H) 0.0 - 2.0 mmol/L   O2 Saturation 85.1 %   Patient  temperature 98.6     Comment: Performed at [facility], [place] [address], [place], [place] [address]  D-dimer, quantitative     Status: Abnormal   Collection Time: 04/17/21  2:20 AM  Result Value Ref Range   D-Dimer, Quant 1.62 (H) 0.00 - 0.50 ug/mL-FEU    Comment: (NOTE) At the manufacturer cut-off value of 0.5 g/mL FEU, this assay has a negative predictive value of 95-100%.This assay is intended for use in conjunction with a clinical pretest probability (PTP) assessment model to exclude pulmonary embolism (PE) and deep venous thrombosis (DVT) in outpatients suspected of PE or DVT. Results should be correlated with clinical presentation. Performed at [facility], [place] [address], [place], [place] [address]   Vitamin B12     Status: None   Collection Time: 04/17/21  5:25 AM  Result Value Ref Range   Vitamin B-12 899 180 - 914 pg/mL    Comment: (NOTE) This assay is not validated for testing neonatal or myeloproliferative syndrome specimens for Vitamin B12 levels. Performed at [facility], [place] [address] [place],  [address]   Folate     Status: None   Collection Time: 04/17/21  5:25 AM  Result Value Ref Range   Folate 7.9 >5.9 ng/mL  Comment: Performed at [facility], [place] [address] [place], [place] [address]  Iron and TIBC     Status: Abnormal   Collection Time: 04/17/21  5:25 AM  Result Value Ref Range   Iron 19 (L) 28 - 170 ug/dL   TIBC 394 250 - 450 ug/dL   Saturation Ratios 5 (L) 10.4 - 31.8 %   UIBC 375 ug/dL    Comment: Performed at [facility], [place] [address], [place], [place] [address]  Ferritin     Status: Abnormal   Collection Time: 04/17/21  5:25 AM  Result Value Ref Range   Ferritin 8 (L) 11 - 307 ng/mL    Comment: Performed at [facility], [place] [address] [place], [place] [address]  Reticulocytes     Status: Abnormal   Collection  Time: 04/17/21  5:25 AM  Result Value Ref Range   Retic Ct Pct 0.8 0.4 - 3.1 %   RBC. 4.62 3.87 - 5.11 MIL/uL   Retic Count, Absolute 38.3 19.0 - 186.0 K/uL   Immature Retic Fract 24.6 (H) 2.3 - 15.9 %    Comment: Performed at [facility], [place] [address], [place], [place] [address]  Magnesium     Status: None   Collection Time: 04/17/21  5:25 AM  Result Value Ref Range   Magnesium 1.8 1.7 - 2.4 mg/dL    Comment: Performed at [facility], [place] [address] [place], [place] [address]  Phosphorus     Status: None   Collection Time: 04/17/21  5:25 AM  Result Value Ref Range   Phosphorus 4.2 2.5 - 4.6 mg/dL    Comment: Performed at [facility], [place] [address], [place], [place] [address]  CBC WITH DIFFERENTIAL     Status: Abnormal   Collection Time: 04/17/21  5:25 AM  Result Value Ref Range   WBC 4.7 4.0 - 10.5 K/uL   RBC 4.62 3.87 - 5.11 MIL/uL   Hemoglobin 10.4 (L) 12.0 - 15.0 g/dL   HCT 35.7 (L) 36.0 - 46.0 %   MCV 77.3 (L) 80.0 - 100.0 fL   MCH 22.5 (L) 26.0 - 34.0 pg   MCHC 29.1 (L) 30.0 - 36.0 g/dL   RDW 21.4 (H) 11.5 - 15.5 %   Platelets 323 150 - 400 K/uL   nRBC 0.0 0.0 - 0.2 %   Neutrophils Relative % 58 %   Neutro Abs 2.7 1.7 - 7.7 K/uL   Lymphocytes Relative 21 %   Lymphs Abs 1.0 0.7 - 4.0 K/uL   Monocytes Relative 15 %   Monocytes Absolute 0.7 0.1 - 1.0 K/uL   Eosinophils Relative 5 %   Eosinophils Absolute 0.2 0.0 - 0.5 K/uL   Basophils Relative 1 %   Basophils Absolute 0.1 0.0 - 0.1 K/uL   RBC Morphology ELLIPTOCYTES    Immature Granulocytes 0 %   Abs Immature Granulocytes 0.01 0.00 - 0.07 K/uL   Schistocytes PRESENT     Comment: Performed at [facility], [place] [address], [place], [place] [address]  TSH     Status: None   Collection Time: 04/17/21  5:25 AM  Result Value Ref Range   TSH 2.511 0.350 - 4.500 uIU/mL    Comment: Performed by a 3rd Generation assay with a functional  sensitivity of <=0.01 uIU/mL. Performed at [facility], [place] [address] [place], [place] [address]   Comprehensive metabolic panel  Status: Abnormal   Collection Time: 04/17/21  5:25 AM  Result Value Ref Range   Sodium 140 135 - 145 mmol/L   Potassium 3.7 3.5 - 5.1 mmol/L   Chloride 107 98 - 111 mmol/L   CO2 27 22 - 32 mmol/L   Glucose, Bld 96 70 - 99 mg/dL    Comment: Glucose reference range applies only to samples taken after fasting for at least 8 hours.   BUN 14 6 - 20 mg/dL   Creatinine, Ser 0.71 0.44 - 1.00 mg/dL   Calcium 8.6 (L) 8.9 - 10.3 mg/dL   Total Protein 7.0 6.5 - 8.1 g/dL   Albumin 3.3 (L) 3.5 - 5.0 g/dL   AST 24 15 - 41 U/L   ALT 16 0 - 44 U/L   Alkaline Phosphatase 77 38 - 126 U/L   Total Bilirubin 1.3 (H) 0.3 - 1.2 mg/dL   GFR, Estimated >60 >60 mL/min    Comment: (NOTE) Calculated using the CKD-EPI Creatinine Equation (2021)    Anion gap 6 5 - 15    Comment: Performed at [facility], [place] [address], [place], [place] [address]  Procalcitonin - Baseline     Status: None   Collection Time: 04/17/21  5:25 AM  Result Value Ref Range   Procalcitonin <0.10 ng/mL    Comment:        Interpretation: PCT (Procalcitonin) <= 0.5 ng/mL: Systemic infection (sepsis) is not likely. Local bacterial infection is possible. (NOTE)       Sepsis PCT Algorithm           Lower Respiratory Tract                                      Infection PCT Algorithm    ----------------------------     ----------------------------         PCT < 0.25 ng/mL                PCT < 0.10 ng/mL          Strongly encourage             Strongly discourage   discontinuation of antibiotics    initiation of antibiotics    ----------------------------     -----------------------------       PCT 0.25 - 0.50 ng/mL            PCT 0.10 - 0.25 ng/mL               OR       >80% decrease in PCT            Discourage initiation of                                             antibiotics      Encourage discontinuation           of antibiotics    ----------------------------     -----------------------------         PCT >= 0.50 ng/mL              PCT 0.26 - 0.50 ng/mL               AND        <80% decrease in PCT  Encourage initiation of                                             antibiotics       Encourage continuation           of antibiotics    ----------------------------     -----------------------------        PCT >= 0.50 ng/mL                  PCT > 0.50 ng/mL               AND         increase in PCT                  Strongly encourage                                      initiation of antibiotics    Strongly encourage escalation           of antibiotics                                     -----------------------------                                           PCT <= 0.25 ng/mL                                                 OR                                        > 80% decrease in PCT                                      Discontinue / Do not initiate                                             antibiotics  Performed at [place] [address] [place], [place] [address]     ECG   Sinus tachycardia at 111, incomplete RBBB and LAFB, low voltage- Personally Reviewed  Telemetry   Sinus tachycardia- Personally Reviewed  Radiology   US PELVIS (TRANSABDOMINAL ONLY)  Result Date: 04/17/2021 CLINICAL DATA:  Initial evaluation for vaginal bleeding. EXAM: TRANSABDOMINAL ULTRASOUND OF PELVIS TECHNIQUE: Transabdominal ultrasound examination of the pelvis was performed including evaluation of the uterus, ovaries, adnexal regions, and pelvic cul-de-sac. COMPARISON:  Prior CT from earlier the same day. FINDINGS: Examination technically limited by body habitus and patient's inability to position for the exam. Uterus Measurements: 9.3 x 3.1 x 5.7 cm = volume: A 7.6 mL. Uterus is  anteverted. No visible discrete  fibroid or other myometrial abnormality on this limited exam. Endometrium Not visualized on this transabdominal only exam. Right ovary Not visualized.  No adnexal mass. Left ovary Not visualized.  No adnexal mass. Other findings: Moderate volume free fluid seen within the pelvis, corresponding with appearance on prior CT. IMPRESSION: 1. Technically limited exam with nonvisualization of the endometrium. If there remains clinical concern for possible underlying occult endometrial pathology, further assessment with dedicated sonohysterography could be performed for further evaluation as warranted. Correlation with dedicated pelvic MRI may potentially be helpful as well. 2. Grossly normal sonographic appearance of the uterus. 3. Nonvisualization of either ovary.  No adnexal mass. 4. Moderate volume free fluid within the pelvis, corresponding with appearance on prior CT. Electronically Signed   By: Jeannine Boga M.D.   On: 04/17/2021 00:46   CT Abdomen Pelvis W Contrast  Result Date: 04/16/2021 CLINICAL DATA:  Abdominal pain EXAM: CT ABDOMEN AND PELVIS WITH CONTRAST TECHNIQUE: Multidetector CT imaging of the abdomen and pelvis was performed using the standard protocol following bolus administration of intravenous contrast. CONTRAST:  111m OMNIPAQUE IOHEXOL 350 MG/ML SOLN COMPARISON:  07/13/2005 FINDINGS: Lower chest: Small bilateral pleural effusions are seen, more so on the right side. There are linear patchy infiltrates in both lower lung fields, more so on the right side. Heart is enlarged in size. Hepatobiliary: Liver measures 21.9 cm in length. There is no dilation of bile ducts. Gallbladder is unremarkable. Pancreas: No focal abnormality is seen. Spleen: Unremarkable. Adrenals/Urinary Tract: Adrenals are unremarkable. There is no hydronephrosis. There is 9 mm calculus in the lower pole of left kidney. There are no demonstrable opaque ureteral stones. Urinary bladder is not distended. Stomach/Bowel:  Stomach is not distended. Small bowel loops are not dilated. Appendix is not distinctly seen. There is no wall thickening in colon. Vascular/Lymphatic: There are scattered arterial calcifications. There are slightly enlarged lymph nodes in the retroperitoneum and mesentery. Reproductive: Unremarkable. Other: There is no pneumoperitoneum. Small ascites is present. There is diffuse edema in the subcutaneous plane in the abdominal wall. Small umbilical hernia containing fat is seen. Musculoskeletal: Degenerative changes are noted in the lumbar spine, more so at L5-S1 level. Degenerative changes are noted in the lower thoracic spine. IMPRESSION: There is no evidence intestinal obstruction or pneumoperitoneum. There is no hydronephrosis. There is 9 mm calculus in the lower pole of left kidney. Small ascites. There is diffuse edema in the subcutaneous plane in the abdominal wall suggesting anasarca. Small bilateral pleural effusions are seen, more so on the right side. There are patchy infiltrates in both lower lung fields suggesting atelectasis/pneumonia. Cardiomegaly. Other findings as described in the body of the report. Electronically Signed   By: PElmer PickerM.D.   On: 04/16/2021 19:46   DG CHEST PORT 1 VIEW  Result Date: 04/16/2021 CLINICAL DATA:  Hematuria x2 days and SOB off and on for 5 days, she also states that blood may be coming out of her vagina. Pt denies dysuria. EXAM: PORTABLE CHEST 1 VIEW COMPARISON:  Chest x-ray 01/25/2021 FINDINGS: Enlarged cardiac silhouette. The heart and mediastinal contours are unchanged. Bilateral mid to lower lung zone airspace opacities. No pulmonary edema. No pleural effusion. No pneumothorax. No acute osseous abnormality. IMPRESSION: 1. Persistent enlarged cardiac silhouette with underlying pericardial effusion not excluded. 2. Bilateral mid to lower lung airspace opacities that may represent a combination of atelectasis, infection, inflammation. Electronically  Signed   By: MIven FinnM.D.   On: 04/16/2021  23:55    Cardiac Studies   [place] pending  Impression   Principal Problem:   Anasarca Active Problems:   Lower urinary tract infectious disease   Morbid obesity ([place])   Vaginal bleeding   Iron deficiency anemia due to chronic blood loss   Cardiomegaly   Chest pain   Atrial flutter ([place])   CAP (community acquired pneumonia)   Recommendation   Anasarca Ms. Amber Herring is massively volume overloaded and presents with acute on chronic edema and appears to have chronic venous stasis changes.  CT and chest x-ray demonstrated cardiomegaly and BNP is elevated suggestive of heart failure.  Echocardiogram is pending.  She will need more aggressive diuresis in fact she is not on a diuretic at this point.  It is normal.  Start Lasix 40 IV 3 times daily.  Monitor urine output.  [place]ian adjustments based on echo findings. Probable sinus tachycardia vs. ectopic atrial tachycardia EKG to me appears more to be a sinus tachycardia or perhaps ectopic atrial tachycardia.  She has upright P waves which are different than her T waves noted in V1 and V2.  He has been started on low-dose metoprolol and will monitor that. Community-acquired pneumonia Started on azithromycin and ceftriaxone for community-acquired pneumonia per hospital medicine. Vaginal bleeding Will need outpatient work-up including vaginal ultrasound.  Thanks for the consultation, cardiology will follow with you  Time Spent Directly with Patient:  I have spent a total of 65 minutes with the patient reviewing hospital notes, telemetry, EKGs, labs and examining the patient as well as establishing an assessment and plan that was discussed personally with the patient.  > 50% of time was spent in direct patient care.  Length of Stay:  LOS: 0 days   Pixie Casino, MD, [facility], [place] Director of the Advanced Lipid Disorders &  Cardiovascular Risk Reduction  Clinic Diplomate of the American Board of Clinical Lipidology Attending Cardiologist  Direct Dial: [phone]   Fax: [phone]  Website:  [email or web address] [email or web address] Humphrey Guerreiro 04/17/2021, 9:42 AM

## 2021-04-18 DIAGNOSIS — I471 Supraventricular tachycardia: Secondary | ICD-10-CM

## 2021-04-18 DIAGNOSIS — I5021 Acute systolic (congestive) heart failure: Secondary | ICD-10-CM

## 2021-04-18 LAB — CBC
HCT: 36.3 % (ref 36.0–46.0)
Hemoglobin: 10.4 g/dL — ABNORMAL LOW (ref 12.0–15.0)
MCH: 22.3 pg — ABNORMAL LOW (ref 26.0–34.0)
MCHC: 28.7 g/dL — ABNORMAL LOW (ref 30.0–36.0)
MCV: 77.7 fL — ABNORMAL LOW (ref 80.0–100.0)
Platelets: 323 10*3/uL (ref 150–400)
RBC: 4.67 MIL/uL (ref 3.87–5.11)
RDW: 21.3 % — ABNORMAL HIGH (ref 11.5–15.5)
WBC: 5.4 10*3/uL (ref 4.0–10.5)
nRBC: 0 % (ref 0.0–0.2)

## 2021-04-18 LAB — COMPREHENSIVE METABOLIC PANEL
ALT: 16 U/L (ref 0–44)
AST: 23 U/L (ref 15–41)
Albumin: 3.2 g/dL — ABNORMAL LOW (ref 3.5–5.0)
Alkaline Phosphatase: 77 U/L (ref 38–126)
Anion gap: 9 (ref 5–15)
BUN: 13 mg/dL (ref 6–20)
CO2: 29 mmol/L (ref 22–32)
Calcium: 9 mg/dL (ref 8.9–10.3)
Chloride: 102 mmol/L (ref 98–111)
Creatinine, Ser: 0.79 mg/dL (ref 0.44–1.00)
GFR, Estimated: >60 mL/min (ref >60)
Glucose, Bld: 99 mg/dL (ref 70–99)
Potassium: 3.6 mmol/L (ref 3.5–5.1)
Sodium: 140 mmol/L (ref 135–145)
Total Bilirubin: 1.2 mg/dL (ref 0.3–1.2)
Total Protein: 7.3 g/dL (ref 6.5–8.1)

## 2021-04-18 LAB — MAGNESIUM: Magnesium: 1.8 mg/dL (ref 1.7–2.4)

## 2021-04-18 LAB — PROCALCITONIN: Procalcitonin: <0.1 ng/mL

## 2021-04-18 MED ORDER — SODIUM CHLORIDE 0.9 % IV SOLN
510.0000 mg | Freq: Once | INTRAVENOUS | Status: AC
  Administered 2021-04-18: 510 mg via INTRAVENOUS
  Filled 2021-04-18: qty 17

## 2021-04-18 MED ORDER — METOPROLOL TARTRATE 25 MG PO TABS
25.0000 mg | ORAL_TABLET | Freq: Two times a day (BID) | ORAL | Status: DC
  Administered 2021-04-18 – 2021-04-19 (×3): 25 mg via ORAL
  Filled 2021-04-18 (×3): qty 1

## 2021-04-18 MED ORDER — MAGNESIUM SULFATE 2 GM/50ML IV SOLN
2.0000 g | Freq: Once | INTRAVENOUS | Status: AC
  Administered 2021-04-18: 2 g via INTRAVENOUS
  Filled 2021-04-18: qty 50

## 2021-04-18 NOTE — Progress Notes (Signed)
Amber Herring  WUX:[id number] DOB: 01-30-1961 DOA: 04/16/2021 PCP: Denita Lung, MD    Brief Narrative:  61yo with a history of morbid obesity, depression, chronic anemia, GERD, and HLD who presented to the ED with severe generalized swelling primarily of the legs and abdomen.  ROS was also positive for intermittent dysfunctional vaginal bleeding.  Consultants:  Cardiology  Code Status: FULL CODE  Antimicrobials:  Rocephin 12/30  Azithromycin 12/30   DVT prophylaxis: SCDs  Interim history: Afebrile.  Remains tachycardic at approximately 110.  Blood pressure stable.  Saturation 94% room air.  TTE as noted EF 45-50% with global hypokinesis and mildly elevated pulmonary artery systolic pressure.  She is net negative approximately 6 L since admission thus far.  In good spirits.  Tells me she feels about the same today.  No significant shortness of breath at rest.  No chest pain or abdominal pain.  Assessment & Plan:  Anasarca -newly diagnosed mild acute systolic CHF -probable pulmonary hypertension TTE noted EF 45-50% with global hypokinesis and mildly elevated PASP - aggressive IV diuresis to continue - net negative approximately 6 L since admission - likely driven by sleep apnea +/- arrhythmia  Sinus Tachycardia versus ectopic atrial tachycardia Beta-blocker being titrated by cardiology  Morbid obesity - Body mass index is 51.69 kg/m.  Vaginal bleeding Will need outpatient evaluation -monitor hemoglobin for now - limited pelvic US this admit without acute findings, but was not able to visualize all structures/endometrium -no evidence of significant ongoing bleeding presently  Iron deficiency anemia Iron low at presentation with normal TIBC and ferritin of 8 - monitor Hgb during admit -infuse IV iron today  ?  UTI No clear sx to suggest a urinary tract infection - follow clinically w/o abx for now - f/u culture data  PNA ruled out  With normal procalcitonin, normal WBC,  and pleural effusions I doubt this represents an infectious pneumonia - stopped abx - f/u procalcitonin remains normal   Family Communication: no family present at time of exam    Objective: Blood pressure (!) 109/55, pulse (!) 110, temperature 98.2 F (36.8 C), temperature source Oral, resp. rate 20, height _0  (1.753 m), weight (!) 158.8 kg, SpO2 94 %.  Intake/Output Summary (Last 24 hours) at 04/18/2021 1223 Last data filed at 04/18/2021 1200 Gross per 24 hour  Intake --  Output 6200 ml  Net -6200 ml    Filed Weights   04/16/21 1509  Weight: (!) 158.8 kg    Examination: General: No acute respiratory distress Lungs: poor air movement B bases without wheezing Cardiovascular: distant HS - no appreciable M - RRR  Abdomen: morbidly obese - soft - BS+ - edema of abdom wall  Extremities: 3+ B LE to pelvis without significant change- change of venous stasis dermatitis B LE   CBC: Recent Labs  Lab 04/16/21 1721 04/17/21 0525 04/18/21 0553  WBC 5.1 4.7 5.4  NEUTROABS 3.0 2.7  --   HGB 10.9* 10.4* 10.4*  HCT 37.5 35.7* 36.3  MCV 77.2* 77.3* 77.7*  PLT 315 323 253    Basic Metabolic Panel: Recent Labs  Lab 04/16/21 1721 04/17/21 0525 04/18/21 0553  NA 139 140 140  K 3.7 3.7 3.6  CL 105 107 102  CO2 _1 GLUCOSE 79 96 99  BUN _2 CREATININE 0.78 0.71 0.79  CALCIUM 8.7* 8.6* 9.0  MG  --  1.8 1.8  PHOS  --  4.2  --  GFR: Estimated Creatinine Clearance: 124.9 mL/min (by C-G formula based on SCr of 0.79 mg/dL).  Liver Function Tests: Recent Labs  Lab 04/16/21 1721 04/17/21 0525 04/18/21 0553  AST _0 ALT _1 ALKPHOS 87 77 77  BILITOT 1.8* 1.3* 1.2  PROT 7.5 7.0 7.3  ALBUMIN 3.5 3.3* 3.2*     Recent Results (from the past 240 hour(s))  Resp Panel by RT-PCR (Flu A&B, Covid) Nasopharyngeal Swab     Status: None   Collection Time: 04/16/21  5:20 PM   Specimen: Nasopharyngeal Swab; Nasopharyngeal(NP) swabs in vial transport  medium  Result Value Ref Range Status   SARS Coronavirus 2 by RT PCR NEGATIVE NEGATIVE Final    Comment: (NOTE) SARS-CoV-2 target nucleic acids are NOT DETECTED.  The SARS-CoV-2 RNA is generally detectable in upper respiratory specimens during the acute phase of infection. The lowest concentration of SARS-CoV-2 viral copies this assay can detect is 138 copies/mL. A negative result does not preclude SARS-Cov-2 infection and should not be used as the sole basis for treatment or other patient management decisions. A negative result may occur with  improper specimen collection/handling, submission of specimen other than nasopharyngeal swab, presence of viral mutation(s) within the areas targeted by this assay, and inadequate number of viral copies(<138 copies/mL). A negative result must be combined with clinical observations, patient history, and epidemiological information. The expected result is Negative.  Fact Sheet for Patients:  EntrepreneurPulse.com.au  Fact Sheet for Healthcare Providers:  IncredibleEmployment.be  This test is no t yet approved or cleared by the [place] FDA and  has been authorized for detection and/or diagnosis of SARS-CoV-2 by FDA under an Emergency Use Authorization (EUA). This EUA will remain  in effect (meaning this test can be used) for the duration of the COVID-19 declaration under Section 564(b)(1) of the Act, 21 U.S.C.section 360bbb-3(b)(1), unless the authorization is terminated  or revoked sooner.       Influenza A by PCR NEGATIVE NEGATIVE Final   Influenza B by PCR NEGATIVE NEGATIVE Final    Comment: (NOTE) The Xpert Xpress SARS-CoV-2/FLU/RSV plus assay is intended as an aid in the diagnosis of influenza from Nasopharyngeal swab specimens and should not be used as a sole basis for treatment. Nasal washings and aspirates are unacceptable for Xpert Xpress SARS-CoV-2/FLU/RSV testing.  Fact Sheet for  Patients: EntrepreneurPulse.com.au  Fact Sheet for Healthcare Providers: IncredibleEmployment.be  This test is not yet approved or cleared by the [place] FDA and has been authorized for detection and/or diagnosis of SARS-CoV-2 by FDA under an Emergency Use Authorization (EUA). This EUA will remain in effect (meaning this test can be used) for the duration of the COVID-19 declaration under Section 564(b)(1) of the Act, 21 U.S.C. section 360bbb-3(b)(1), unless the authorization is terminated or revoked.  Performed at [facility], [place] [address], [place], [place] [address]       Scheduled Meds:  furosemide  40 mg Intravenous TID   metoprolol tartrate  25 mg Oral BID   sodium chloride flush  3 mL Intravenous Q12H   Continuous Infusions:  sodium chloride       LOS: 1 day   Cherene Altes, MD [facility] Office  [phone] Pager - Text Page per Shea Evans  If 7PM-7AM, please contact night-coverage per Amion 04/18/2021, 12:23 PM

## 2021-04-18 NOTE — Evaluation (Signed)
Occupational Therapy Evaluation Patient Details Name: Amber Herring MRN: [id number] DOB: Dec 31, 1960 Today's Date: 04/18/2021   History of Present Illness Patient presents to the ER 04/16/21 with chief complaint of shortness of breath and swelling of her lower abdomen and bilateral lower extremities, anasarca. PMH: recent left hand fx-3 months ago), GERD, obesity, metabolic syndrome, anxiety.   Clinical Impression   Ms. 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 She had maintained independence with ADLs and functional mobility while receiving outpatient OT services. She presents today with decreased ROM and strength of left upper extremity, generalized weakness, generalized edema, decreased activity tolerance, impaired balance and pain in bilateral knees and left hand resulting in a decline in functional abilities. Today she requires mod-max x 2 for bed transfers, min guard x 2 to stand from edge of bed with use of eval walker and inability to take steps due to legs not fitting in EVA walker frame. She is set up assist for UB ADLs and max-total assist for LB ADLs. She is significantly limited by edema in lower extremities. Patient will benefit from skilled OT services while in hospital to improve deficits and learn compensatory strategies as needed in order to return to PLOF.  Patient is highly motivated. Currently recommend short term rehab but could progress to [facility] pending progress.      Recommendations for follow up therapy are one component of a multi-disciplinary discharge planning process, led by the attending physician.  Recommendations may be updated based on patient status, additional functional criteria and insurance authorization.   Follow Up Recommendations  Skilled nursing-short term rehab (<3 hours/day)    Assistance Recommended  at Discharge Frequent or constant Supervision/Assistance  Functional Status Assessment  Patient has had a recent decline in their functional status and demonstrates the ability to make significant improvements in function in a reasonable and predictable amount of time.  Equipment Recommendations   (TBD)    Recommendations for Other Services       Precautions / Restrictions Precautions Precautions: Fall Precaution Comments: pt reports knee pain. Left  hand Required Braces or Orthoses: Splint/Cast Splint/Cast: has a splint for Left hand - for heavier ADL and hand use. Restrictions Weight Bearing Restrictions: No Other Position/Activity Restrictions: From my understanding of fracture and reading outpatient OT notes - use of splint with heavier tasks but otherwise is WBAT.      Mobility Bed Mobility Overal bed mobility: Needs Assistance Bed Mobility: Rolling;Supine to Sit;Sit to Supine Rolling: Mod assist;+2 for safety/equipment   Supine to sit: Mod assist;+2 for physical assistance Sit to supine: Max assist;+2 for physical assistance   General bed mobility comments: Able to roll left and right with use of bed rails - assistance for LEs. Mod x 2 to transfer to side of bed with patient using bed rails and assisting with upper body. She requires the most assistance for severely edematous LEs.    Transfers Overall transfer level: Needs assistance Equipment used:  (EVA walker) Transfers: Sit to/from Stand Sit to Stand: Min guard;+2 safety/equipment           General transfer comment: Min guard to stand twice with eva walker. Difficulty extending trunk and bearing a lot of weight through forearms. Unable to take steps as her legs could not fit in frame. Also reports bilateral knee pain.  Balance Overall balance assessment: Needs assistance Sitting-balance support: No upper extremity supported;Feet supported Sitting balance-Leahy Scale: Fair Sitting balance - Comments:  balance tenuous due to air mattress but appears fair   Standing balance support: During functional activity Standing balance-Leahy Scale: Poor                             ADL either performed or assessed with clinical judgement   ADL Overall ADL's : Needs assistance/impaired Eating/Feeding: Independent   Grooming: Set up   Upper Body Bathing: Set up   Lower Body Bathing: Total assistance;Bed level   Upper Body Dressing : Set up   Lower Body Dressing: Total assistance Lower Body Dressing Details (indicate cue type and reason): to doff socks and don slides   Toilet Transfer Details (indicate cue type and reason): unable Toileting- Clothing Manipulation and Hygiene: Total assistance;Bed level Toileting - Clothing Manipulation Details (indicate cue type and reason): use of external catheter             Vision Patient Visual Report: No change from baseline       Perception     Praxis      Pertinent Vitals/Pain Pain Assessment: Faces Faces Pain Scale: Hurts little more Pain Location: bilateral knees and hand Pain Descriptors / Indicators: Grimacing;Aching Pain Intervention(s): Monitored during session;Limited activity within patient's tolerance     Hand Dominance Left   Extremity/Trunk Assessment Upper Extremity Assessment Upper Extremity Assessment: RUE deficits/detail;LUE deficits/detail RUE Deficits / Details: WFL ROM, 5/5 strength RUE Sensation: WNL RUE Coordination: WNL LUE Deficits / Details: WFL ROM and strength of left shoulder and elbow - forearm, wrist and fingers have functional ROM but approx 3+/5 strength LUE Coordination: decreased fine motor   Lower Extremity Assessment Lower Extremity Assessment: Defer to PT evaluation   Cervical / Trunk Assessment Cervical / Trunk Assessment:  (had difficulty extending trunk with attempting to stand.)   Communication Communication Communication: No difficulties   Cognition Arousal/Alertness:  Awake/alert Behavior During Therapy: WFL for tasks assessed/performed Overall Cognitive Status: Within Functional Limits for tasks assessed                                 General Comments: Pleasant and funny.     General Comments       Exercises     Shoulder Instructions      Home Living Family/patient expects to be discharged to:: Private residence Living Arrangements: Children Available Help at Discharge: Family;Available PRN/intermittently Type of Home: Apartment Home Access: Stairs to enter Entrance Stairs-Number of Steps: 14 Entrance Stairs-Rails: Right;Left Home Layout: One level     Bathroom Shower/Tub: Tub/shower unit         Home Equipment: Cane - single point   Additional Comments: Reports she typically showed in handicap shower stalls on the road      Prior Functioning/Environment Prior Level of Function : Independent/Modified Independent             Mobility Comments: until recently, independent with amb and ADL's, could negotiate steps but only when necessary. ADLs Comments: Indep.        OT Problem List: Obesity;Increased edema;Decreased strength;Decreased range of motion;Decreased activity tolerance;Impaired balance (sitting and/or standing);Decreased knowledge of use of DME or AE;Pain      OT Treatment/Interventions: Self-care/ADL training;Therapeutic exercise;DME and/or AE instruction;Therapeutic activities;Balance training;Patient/family education    OT Goals(Current goals can be  found in the care plan section) Acute Rehab OT Goals Patient Stated Goal: to walk to bathroom OT Goal Formulation: With patient Time For Goal Achievement: 05/02/21 Potential to Achieve Goals: Good  OT Frequency: Min 2X/week   Barriers to D/C:            Co-evaluation              AM-PAC OT "6 Clicks" Daily Activity     Outcome Measure Help from another person eating meals?: None Help from another person taking care of personal  grooming?: A Little Help from another person toileting, which includes using toliet, bedpan, or urinal?: Total Help from another person bathing (including washing, rinsing, drying)?: A Lot Help from another person to put on and taking off regular upper body clothing?: A Little Help from another person to put on and taking off regular lower body clothing?: Total 6 Click Score: 14   End of Session Nurse Communication: Mobility status  Activity Tolerance: Patient tolerated treatment well Patient left: in bed;with call bell/phone within reach  OT Visit Diagnosis: Other abnormalities of gait and mobility (R26.89);Muscle weakness (generalized) (M62.81)                Time: 6854-8830 OT Time Calculation (min): 39 min Charges:  OT General Charges $OT Visit: 1 Visit OT Evaluation $OT Eval Moderate Complexity: 1 Mod OT Treatments $Self Care/Home Management : 8-22 mins  Adonnis Salceda, OTR/L [place] [phone] Pager: [phone]   Lenward Chancellor 04/18/2021, 1:54 PM

## 2021-04-18 NOTE — Progress Notes (Signed)
DAILY PROGRESS NOTE   Patient Name: Amber Herring Date of Encounter: 04/18/2021 Cardiologist: None  Chief Complaint   Feels better  Patient Profile   61 yo female with history of obesity, depression and anemia presented with generalized swelling and cardiomegaly concerning for heart failure as well as atrial flutter on admission  Subjective   Good diuresis overnight -recorded net negative 3.4L. Creatinine appears to be stable with this rate of diuresis. Potassium 3.6 today. Echo yesterday showed LVEF 45-50% with RV enlargement and mild pulmonary hypertension (suspect OSA). HR remains elevated today- ? Arrythmia such as atrial tach, but has appeared sinus to me. No clear A wave on echo - ?related to E/A fusion or lack of atrial kick. Magnesium 1.8 today.   Objective   Vitals:   04/17/21 1255 04/17/21 1723 04/17/21 1957 04/18/21 0603  BP: 109/71 118/75 133/77 (!) 109/55  Pulse: (!) 111 (!) 111 (!) 110 (!) 110  Resp: _0 Temp:  98.1 F (36.7 C) 97.9 F (36.6 C) 98.2 F (36.8 C)  TempSrc:  Oral Oral Oral  SpO2: 94% 95% 96% 94%  Weight:      Height:        Intake/Output Summary (Last 24 hours) at 04/18/2021 7460 Last data filed at 04/18/2021 0298 Gross per 24 hour  Intake 10 ml  Output 3500 ml  Net -3490 ml   Filed Weights   04/16/21 1509  Weight: (!) 158.8 kg    Physical Exam   General appearance: alert, no distress, and morbidly obese Neck: JVD - several cm above sternal notch, no carotid bruit, and thyroid not enlarged, symmetric, no tenderness/mass/nodules Lungs: diminished breath sounds bilaterally Heart: regular tachycardia Abdomen: obese, protuberant Extremities: edema 3+ bilateral woody stasis edema Pulses: 2+ and symmetric Skin: Skin color, texture, turgor normal. No rashes or lesions Neurologic: Grossly normal Psych: Pleasant  Inpatient Medications    Scheduled Meds:  furosemide  40 mg Intravenous TID   metoprolol tartrate  12.5 mg Oral  BID   sodium chloride flush  3 mL Intravenous Q12H    Continuous Infusions:  sodium chloride      PRN Meds: sodium chloride, acetaminophen **OR** [DISCONTINUED] acetaminophen, oxyCODONE, sodium chloride flush, traMADol   Labs   Results for orders placed or performed during the hospital encounter of 04/16/21 (from the past 48 hour(s))  Resp Panel by RT-PCR (Flu A&B, Covid) Nasopharyngeal Swab     Status: None   Collection Time: 04/16/21  5:20 PM   Specimen: Nasopharyngeal Swab; Nasopharyngeal(NP) swabs in vial transport medium  Result Value Ref Range   SARS Coronavirus 2 by RT PCR NEGATIVE NEGATIVE    Comment: (NOTE) SARS-CoV-2 target nucleic acids are NOT DETECTED.  The SARS-CoV-2 RNA is generally detectable in upper respiratory specimens during the acute phase of infection. The lowest concentration of SARS-CoV-2 viral copies this assay can detect is 138 copies/mL. A negative result does not preclude SARS-Cov-2 infection and should not be used as the sole basis for treatment or other patient management decisions. A negative result may occur with  improper specimen collection/handling, submission of specimen other than nasopharyngeal swab, presence of viral mutation(s) within the areas targeted by this assay, and inadequate number of viral copies(<138 copies/mL). A negative result must be combined with clinical observations, patient history, and epidemiological information. The expected result is Negative.  Fact Sheet for Patients:  EntrepreneurPulse.com.au  Fact Sheet for Healthcare Providers:  IncredibleEmployment.be  This test is no t yet approved  or cleared by the [place] and  has been authorized for detection and/or diagnosis of SARS-CoV-2 by FDA under an Emergency Use Authorization (EUA). This EUA will remain  in effect (meaning this test can be used) for the duration of the COVID-19 declaration under Section 564(b)(1) of  the Act, 21 U.S.C.section 360bbb-3(b)(1), unless the authorization is terminated  or revoked sooner.       Influenza A by PCR NEGATIVE NEGATIVE   Influenza B by PCR NEGATIVE NEGATIVE    Comment: (NOTE) The Xpert Xpress SARS-CoV-2/FLU/RSV plus assay is intended as an aid in the diagnosis of influenza from Nasopharyngeal swab specimens and should not be used as a sole basis for treatment. Nasal washings and aspirates are unacceptable for Xpert Xpress SARS-CoV-2/FLU/RSV testing.  Fact Sheet for Patients: EntrepreneurPulse.com.au  Fact Sheet for Healthcare Providers: IncredibleEmployment.be  This test is not yet approved or cleared by the [place] FDA and has been authorized for detection and/or diagnosis of SARS-CoV-2 by FDA under an Emergency Use Authorization (EUA). This EUA will remain in effect (meaning this test can be used) for the duration of the COVID-19 declaration under Section 564(b)(1) of the Act, 21 U.S.C. section 360bbb-3(b)(1), unless the authorization is terminated or revoked.  Performed at [facility], [place] [address] [place], [place] [address]   CBC with Differential     Status: Abnormal   Collection Time: 04/16/21  5:21 PM  Result Value Ref Range   WBC 5.1 4.0 - 10.5 K/uL   RBC 4.86 3.87 - 5.11 MIL/uL   Hemoglobin 10.9 (L) 12.0 - 15.0 g/dL   HCT 37.5 36.0 - 46.0 %   MCV 77.2 (L) 80.0 - 100.0 fL   MCH 22.4 (L) 26.0 - 34.0 pg   MCHC 29.1 (L) 30.0 - 36.0 g/dL   RDW 21.7 (H) 11.5 - 15.5 %   Platelets 315 150 - 400 K/uL   nRBC 0.0 0.0 - 0.2 %   Neutrophils Relative % 58 %   Neutro Abs 3.0 1.7 - 7.7 K/uL   Lymphocytes Relative 20 %   Lymphs Abs 1.0 0.7 - 4.0 K/uL   Monocytes Relative 15 %   Monocytes Absolute 0.8 0.1 - 1.0 K/uL   Eosinophils Relative 5 %   Eosinophils Absolute 0.2 0.0 - 0.5 K/uL   Basophils Relative 2 %   Basophils Absolute 0.1 0.0 - 0.1 K/uL   Immature Granulocytes 0 %   Abs  Immature Granulocytes 0.01 0.00 - 0.07 K/uL   Acanthocytes PRESENT    Burr Cells PRESENT    Target Cells PRESENT     Comment: Performed at [facility], [place] [address], [place], [place] [address]  Comprehensive metabolic panel     Status: Abnormal   Collection Time: 04/16/21  5:21 PM  Result Value Ref Range   Sodium 139 135 - 145 mmol/L   Potassium 3.7 3.5 - 5.1 mmol/L   Chloride 105 98 - 111 mmol/L   CO2 27 22 - 32 mmol/L   Glucose, Bld 79 70 - 99 mg/dL    Comment: Glucose reference range applies only to samples taken after fasting for at least 8 hours.   BUN 14 6 - 20 mg/dL   Creatinine, Ser 0.78 0.44 - 1.00 mg/dL   Calcium 8.7 (L) 8.9 - 10.3 mg/dL   Total Protein 7.5 6.5 - 8.1 g/dL   Albumin 3.5 3.5 - 5.0 g/dL   AST 29 15 - 41 U/L   ALT  19 0 - 44 U/L   Alkaline Phosphatase 87 38 - 126 U/L   Total Bilirubin 1.8 (H) 0.3 - 1.2 mg/dL   GFR, Estimated >60 >60 mL/min    Comment: (NOTE) Calculated using the CKD-EPI Creatinine Equation (2021)    Anion gap 7 5 - 15    Comment: Performed at [facility], [place] [address], [place], [place] [address]  Troponin I (High Sensitivity)     Status: None   Collection Time: 04/16/21  5:21 PM  Result Value Ref Range   Troponin I (High Sensitivity) 13 <18 ng/L    Comment: (NOTE) Elevated high sensitivity troponin I (hsTnI) values and significant  changes across serial measurements may suggest ACS but many other  chronic and acute conditions are known to elevate hsTnI results.  Refer to the "Links" section for chest pain algorithms and additional  guidance. Performed at [facility], [place] [address] [place], [place] [address]   Brain natriuretic peptide     Status: Abnormal   Collection Time: 04/16/21  5:22 PM  Result Value Ref Range   B Natriuretic Peptide 322.0 (H) 0.0 - 100.0 pg/mL    Comment: Performed at [facility], [place] [address] [place], [place] [address]   Troponin I (High Sensitivity)     Status: None   Collection Time: 04/16/21  6:27 PM  Result Value Ref Range   Troponin I (High Sensitivity) 12 <18 ng/L    Comment: (NOTE) Elevated high sensitivity troponin I (hsTnI) values and significant  changes across serial measurements may suggest ACS but many other  chronic and acute conditions are known to elevate hsTnI results.  Refer to the "Links" section for chest pain algorithms and additional  guidance. Performed at [facility], [place] [address] [place], [place] [address]   Urinalysis, Routine w reflex microscopic Urine, Clean Catch     Status: Abnormal   Collection Time: 04/16/21  7:47 PM  Result Value Ref Range   Color, Urine AMBER (A) YELLOW    Comment: BIOCHEMICALS MAY BE AFFECTED BY COLOR   APPearance CLOUDY (A) CLEAR   Specific Gravity, Urine 1.028 1.005 - 1.030   pH 5.0 5.0 - 8.0   Glucose, UA NEGATIVE NEGATIVE mg/dL   Hgb urine dipstick LARGE (A) NEGATIVE   Bilirubin Urine NEGATIVE NEGATIVE   Ketones, ur NEGATIVE NEGATIVE mg/dL   Protein, ur >=300 (A) NEGATIVE mg/dL   Nitrite POSITIVE (A) NEGATIVE   Leukocytes,Ua MODERATE (A) NEGATIVE   RBC / HPF >50 (H) 0 - 5 RBC/hpf   WBC, UA >50 (H) 0 - 5 WBC/hpf   Bacteria, UA MANY (A) NONE SEEN   Squamous Epithelial / LPF 0-5 0 - 5   Mucus PRESENT    Ca Oxalate Crys, UA PRESENT     Comment: Performed at [facility], [place] [address], [place], [place] [address]  Blood gas, venous     Status: Abnormal   Collection Time: 04/16/21 11:59 PM  Result Value Ref Range   pH, Ven 7.410 7.250 - 7.430   pCO2, Ven 44.5 44.0 - 60.0 mmHg   pO2, Ven 54.4 (H) 32.0 - 45.0 mmHg   Bicarbonate 27.6 20.0 - 28.0 mmol/L   Acid-Base Excess 3.1 (H) 0.0 - 2.0 mmol/L   O2 Saturation 85.1 %   Patient temperature 98.6     Comment: Performed at [facility], [place] [address], [place], [place] [address]  D-dimer, quantitative  Status: Abnormal    Collection Time: 04/17/21  2:20 AM  Result Value Ref Range   D-Dimer, Quant 1.62 (H) 0.00 - 0.50 ug/mL-FEU    Comment: (NOTE) At the manufacturer cut-off value of 0.5 g/mL FEU, this assay has a negative predictive value of 95-100%.This assay is intended for use in conjunction with a clinical pretest probability (PTP) assessment model to exclude pulmonary embolism (PE) and deep venous thrombosis (DVT) in outpatients suspected of PE or DVT. Results should be correlated with clinical presentation. Performed at [facility], [place] [address], [place], [place] [address]   Vitamin B12     Status: None   Collection Time: 04/17/21  5:25 AM  Result Value Ref Range   Vitamin B-12 899 180 - 914 pg/mL    Comment: (NOTE) This assay is not validated for testing neonatal or myeloproliferative syndrome specimens for Vitamin B12 levels. Performed at [facility], [place] [address], [place], [place] [address]   Folate     Status: None   Collection Time: 04/17/21  5:25 AM  Result Value Ref Range   Folate 7.9 >5.9 ng/mL    Comment: Performed at [facility], [place] [address], [place], [place] [address]  Iron and TIBC     Status: Abnormal   Collection Time: 04/17/21  5:25 AM  Result Value Ref Range   Iron 19 (L) 28 - 170 ug/dL   TIBC 394 250 - 450 ug/dL   Saturation Ratios 5 (L) 10.4 - 31.8 %   UIBC 375 ug/dL    Comment: Performed at [facility], [place] [address], [place], [place] [address]  Ferritin     Status: Abnormal   Collection Time: 04/17/21  5:25 AM  Result Value Ref Range   Ferritin 8 (L) 11 - 307 ng/mL    Comment: Performed at [facility], [place] [address] [place], [place] [address]  Reticulocytes     Status: Abnormal   Collection Time: 04/17/21  5:25 AM  Result Value Ref Range   Retic Ct Pct 0.8 0.4 - 3.1 %   RBC. 4.62 3.87 - 5.11 MIL/uL   Retic Count, Absolute 38.3 19.0 - 186.0 K/uL    Immature Retic Fract 24.6 (H) 2.3 - 15.9 %    Comment: Performed at [facility], [place] [address], [place], [place] [address]  Magnesium     Status: None   Collection Time: 04/17/21  5:25 AM  Result Value Ref Range   Magnesium 1.8 1.7 - 2.4 mg/dL    Comment: Performed at [facility], [place] [address] [place], [place] [address]  Phosphorus     Status: None   Collection Time: 04/17/21  5:25 AM  Result Value Ref Range   Phosphorus 4.2 2.5 - 4.6 mg/dL    Comment: Performed at [facility], [place] [address], [place], [place] [address]  CBC WITH DIFFERENTIAL     Status: Abnormal   Collection Time: 04/17/21  5:25 AM  Result Value Ref Range   WBC 4.7 4.0 - 10.5 K/uL   RBC 4.62 3.87 - 5.11 MIL/uL   Hemoglobin 10.4 (L) 12.0 - 15.0 g/dL   HCT 35.7 (L) 36.0 - 46.0 %   MCV 77.3 (L) 80.0 - 100.0 fL   MCH 22.5 (L) 26.0 - 34.0 pg   MCHC 29.1 (L) 30.0 - 36.0 g/dL   RDW 21.4 (H) 11.5 - 15.5 %   Platelets 323 150 -  400 K/uL   nRBC 0.0 0.0 - 0.2 %   Neutrophils Relative % 58 %   Neutro Abs 2.7 1.7 - 7.7 K/uL   Lymphocytes Relative 21 %   Lymphs Abs 1.0 0.7 - 4.0 K/uL   Monocytes Relative 15 %   Monocytes Absolute 0.7 0.1 - 1.0 K/uL   Eosinophils Relative 5 %   Eosinophils Absolute 0.2 0.0 - 0.5 K/uL   Basophils Relative 1 %   Basophils Absolute 0.1 0.0 - 0.1 K/uL   RBC Morphology ELLIPTOCYTES    Immature Granulocytes 0 %   Abs Immature Granulocytes 0.01 0.00 - 0.07 K/uL   Schistocytes PRESENT     Comment: Performed at [facility], [place] [address], [place], [place] [address]  TSH     Status: None   Collection Time: 04/17/21  5:25 AM  Result Value Ref Range   TSH 2.511 0.350 - 4.500 uIU/mL    Comment: Performed by a 3rd Generation assay with a functional sensitivity of <=0.01 uIU/mL. Performed at [facility], [place] [address], [place], [place] [address]   Comprehensive metabolic panel     Status:  Abnormal   Collection Time: 04/17/21  5:25 AM  Result Value Ref Range   Sodium 140 135 - 145 mmol/L   Potassium 3.7 3.5 - 5.1 mmol/L   Chloride 107 98 - 111 mmol/L   CO2 27 22 - 32 mmol/L   Glucose, Bld 96 70 - 99 mg/dL    Comment: Glucose reference range applies only to samples taken after fasting for at least 8 hours.   BUN 14 6 - 20 mg/dL   Creatinine, Ser 0.71 0.44 - 1.00 mg/dL   Calcium 8.6 (L) 8.9 - 10.3 mg/dL   Total Protein 7.0 6.5 - 8.1 g/dL   Albumin 3.3 (L) 3.5 - 5.0 g/dL   AST 24 15 - 41 U/L   ALT 16 0 - 44 U/L   Alkaline Phosphatase 77 38 - 126 U/L   Total Bilirubin 1.3 (H) 0.3 - 1.2 mg/dL   GFR, Estimated >60 >60 mL/min    Comment: (NOTE) Calculated using the CKD-EPI Creatinine Equation (2021)    Anion gap 6 5 - 15    Comment: Performed at [facility], [place] [address], [place], [place] [address]  Procalcitonin - Baseline     Status: None   Collection Time: 04/17/21  5:25 AM  Result Value Ref Range   Procalcitonin <0.10 ng/mL    Comment:        Interpretation: PCT (Procalcitonin) <= 0.5 ng/mL: Systemic infection (sepsis) is not likely. Local bacterial infection is possible. (NOTE)       Sepsis PCT Algorithm           Lower Respiratory Tract                                      Infection PCT Algorithm    ----------------------------     ----------------------------         PCT < 0.25 ng/mL                PCT < 0.10 ng/mL          Strongly encourage             Strongly discourage   discontinuation of antibiotics    initiation of antibiotics    ----------------------------     -----------------------------  PCT 0.25 - 0.50 ng/mL            PCT 0.10 - 0.25 ng/mL               OR       >80% decrease in PCT            Discourage initiation of                                            antibiotics      Encourage discontinuation           of antibiotics    ----------------------------     -----------------------------         PCT >= 0.50  ng/mL              PCT 0.26 - 0.50 ng/mL               AND        <80% decrease in PCT             Encourage initiation of                                             antibiotics       Encourage continuation           of antibiotics    ----------------------------     -----------------------------        PCT >= 0.50 ng/mL                  PCT > 0.50 ng/mL               AND         increase in PCT                  Strongly encourage                                      initiation of antibiotics    Strongly encourage escalation           of antibiotics                                     -----------------------------                                           PCT <= 0.25 ng/mL                                                 OR                                        > 80% decrease in PCT  Discontinue / Do not initiate                                             antibiotics  Performed at [place] [address] [place], [place] [address]   Procalcitonin     Status: None   Collection Time: 04/18/21  5:53 AM  Result Value Ref Range   Procalcitonin <0.10 ng/mL    Comment:        Interpretation: PCT (Procalcitonin) <= 0.5 ng/mL: Systemic infection (sepsis) is not likely. Local bacterial infection is possible. (NOTE)       Sepsis PCT Algorithm           Lower Respiratory Tract                                      Infection PCT Algorithm    ----------------------------     ----------------------------         PCT < 0.25 ng/mL                PCT < 0.10 ng/mL          Strongly encourage             Strongly discourage   discontinuation of antibiotics    initiation of antibiotics    ----------------------------     -----------------------------       PCT 0.25 - 0.50 ng/mL            PCT 0.10 - 0.25 ng/mL               OR       >80% decrease in PCT            Discourage initiation of                                             antibiotics      Encourage discontinuation           of antibiotics    ----------------------------     -----------------------------         PCT >= 0.50 ng/mL              PCT 0.26 - 0.50 ng/mL               AND        <80% decrease in PCT             Encourage initiation of                                             antibiotics       Encourage continuation           of antibiotics    ----------------------------     -----------------------------        PCT >= 0.50 ng/mL                  PCT > 0.50 ng/mL               AND  increase in PCT                  Strongly encourage                                      initiation of antibiotics    Strongly encourage escalation           of antibiotics                                     -----------------------------                                           PCT <= 0.25 ng/mL                                                 OR                                        > 80% decrease in PCT                                      Discontinue / Do not initiate                                             antibiotics  Performed at [place] [address], [place], [place] [address]   Comprehensive metabolic panel     Status: Abnormal   Collection Time: 04/18/21  5:53 AM  Result Value Ref Range   Sodium 140 135 - 145 mmol/L   Potassium 3.6 3.5 - 5.1 mmol/L   Chloride 102 98 - 111 mmol/L   CO2 29 22 - 32 mmol/L   Glucose, Bld 99 70 - 99 mg/dL    Comment: Glucose reference range applies only to samples taken after fasting for at least 8 hours.   BUN 13 6 - 20 mg/dL   Creatinine, Ser 0.79 0.44 - 1.00 mg/dL   Calcium 9.0 8.9 - 10.3 mg/dL   Total Protein 7.3 6.5 - 8.1 g/dL   Albumin 3.2 (L) 3.5 - 5.0 g/dL   AST 23 15 - 41 U/L   ALT 16 0 - 44 U/L   Alkaline Phosphatase 77 38 - 126 U/L   Total Bilirubin 1.2 0.3 - 1.2 mg/dL   GFR, Estimated >60 >60 mL/min    Comment: (NOTE) Calculated using the CKD-EPI Creatinine  Equation (2021)    Anion gap 9 5 - 15    Comment: Performed at [facility], [place] [address], [place], [place] [address]  CBC     Status: Abnormal   Collection Time: 04/18/21  5:53 AM  Result Value Ref Range   WBC 5.4 4.0 - 10.5 K/uL   RBC 4.67 3.87 - 5.11  MIL/uL   Hemoglobin 10.4 (L) 12.0 - 15.0 g/dL   HCT 36.3 36.0 - 46.0 %   MCV 77.7 (L) 80.0 - 100.0 fL   MCH 22.3 (L) 26.0 - 34.0 pg   MCHC 28.7 (L) 30.0 - 36.0 g/dL   RDW 21.3 (H) 11.5 - 15.5 %   Platelets 323 150 - 400 K/uL   nRBC 0.0 0.0 - 0.2 %    Comment: Performed at [facility], [place] [address] [place], [place] [address]  Magnesium     Status: None   Collection Time: 04/18/21  5:53 AM  Result Value Ref Range   Magnesium 1.8 1.7 - 2.4 mg/dL    Comment: Performed at [facility], [place] [address], [place],  [address]    ECG   Sinus vs ectopic atrial tachycardia - Personally Reviewed  Telemetry   Narrow complex tachycardia - Personally Reviewed  Radiology    US PELVIS (TRANSABDOMINAL ONLY)  Result Date: 04/17/2021 CLINICAL DATA:  Initial evaluation for vaginal bleeding. EXAM: TRANSABDOMINAL ULTRASOUND OF PELVIS TECHNIQUE: Transabdominal ultrasound examination of the pelvis was performed including evaluation of the uterus, ovaries, adnexal regions, and pelvic cul-de-sac. COMPARISON:  Prior CT from earlier the same day. FINDINGS: Examination technically limited by body habitus and patient's inability to position for the exam. Uterus Measurements: 9.3 x 3.1 x 5.7 cm = volume: A 7.6 mL. Uterus is anteverted. No visible discrete fibroid or other myometrial abnormality on this limited exam. Endometrium Not visualized on this transabdominal only exam. Right ovary Not visualized.  No adnexal mass. Left ovary Not visualized.  No adnexal mass. Other findings: Moderate volume free fluid seen within the pelvis, corresponding with appearance on prior CT. IMPRESSION: 1.  Technically limited exam with nonvisualization of the endometrium. If there remains clinical concern for possible underlying occult endometrial pathology, further assessment with dedicated sonohysterography could be performed for further evaluation as warranted. Correlation with dedicated pelvic MRI may potentially be helpful as well. 2. Grossly normal sonographic appearance of the uterus. 3. Nonvisualization of either ovary.  No adnexal mass. 4. Moderate volume free fluid within the pelvis, corresponding with appearance on prior CT. Electronically Signed   By: Jeannine Boga M.D.   On: 04/17/2021 00:46   CT Abdomen Pelvis W Contrast  Result Date: 04/16/2021 CLINICAL DATA:  Abdominal pain EXAM: CT ABDOMEN AND PELVIS WITH CONTRAST TECHNIQUE: Multidetector CT imaging of the abdomen and pelvis was performed using the standard protocol following bolus administration of intravenous contrast. CONTRAST:  17m OMNIPAQUE IOHEXOL 350 MG/ML SOLN COMPARISON:  07/13/2005 FINDINGS: Lower chest: Small bilateral pleural effusions are seen, more so on the right side. There are linear patchy infiltrates in both lower lung fields, more so on the right side. Heart is enlarged in size. Hepatobiliary: Liver measures 21.9 cm in length. There is no dilation of bile ducts. Gallbladder is unremarkable. Pancreas: No focal abnormality is seen. Spleen: Unremarkable. Adrenals/Urinary Tract: Adrenals are unremarkable. There is no hydronephrosis. There is 9 mm calculus in the lower pole of left kidney. There are no demonstrable opaque ureteral stones. Urinary bladder is not distended. Stomach/Bowel: Stomach is not distended. Small bowel loops are not dilated. Appendix is not distinctly seen. There is no wall thickening in colon. Vascular/Lymphatic: There are scattered arterial calcifications. There are slightly enlarged lymph nodes in the retroperitoneum and mesentery. Reproductive: Unremarkable. Other: There is no pneumoperitoneum.  Small ascites is present. There is diffuse edema in the subcutaneous plane in the abdominal wall. Small  umbilical hernia containing fat is seen. Musculoskeletal: Degenerative changes are noted in the lumbar spine, more so at L5-S1 level. Degenerative changes are noted in the lower thoracic spine. IMPRESSION: There is no evidence intestinal obstruction or pneumoperitoneum. There is no hydronephrosis. There is 9 mm calculus in the lower pole of left kidney. Small ascites. There is diffuse edema in the subcutaneous plane in the abdominal wall suggesting anasarca. Small bilateral pleural effusions are seen, more so on the right side. There are patchy infiltrates in both lower lung fields suggesting atelectasis/pneumonia. Cardiomegaly. Other findings as described in the body of the report. Electronically Signed   By: Elmer Picker M.D.   On: 04/16/2021 19:46   DG CHEST PORT 1 VIEW  Result Date: 04/16/2021 CLINICAL DATA:  Hematuria x2 days and SOB off and on for 5 days, she also states that blood may be coming out of her vagina. Pt denies dysuria. EXAM: PORTABLE CHEST 1 VIEW COMPARISON:  Chest x-ray 01/25/2021 FINDINGS: Enlarged cardiac silhouette. The heart and mediastinal contours are unchanged. Bilateral mid to lower lung zone airspace opacities. No pulmonary edema. No pleural effusion. No pneumothorax. No acute osseous abnormality. IMPRESSION: 1. Persistent enlarged cardiac silhouette with underlying pericardial effusion not excluded. 2. Bilateral mid to lower lung airspace opacities that may represent a combination of atelectasis, infection, inflammation. Electronically Signed   By: Iven Finn M.D.   On: 04/16/2021 23:55   ECHOCARDIOGRAM COMPLETE  Result Date: 04/17/2021    ECHOCARDIOGRAM REPORT   Patient Name:   Amber Herring Date of Exam: 04/17/2021 Medical Rec #:  [id number]        Height:       69.0 in Accession #:    [id number]       Weight:       350.0 lb Date of Birth:  [birth year]         BSA:          2.621 m Patient Age:    62 years         BP:           100/70 mmHg Patient Gender: F                HR:           110 bpm. Exam Location:  Inpatient Procedure: 2D Echo, Cardiac Doppler, Color Doppler and Intracardiac            Opacification Agent Indications:    Cardiomegaly I51.7  History:        Patient has no prior history of Echocardiogram examinations.                 Risk Factors:Dyslipidemia. GERD.  Sonographer:    Darlina Sicilian RDCS Referring Phys: [place]  Sonographer Comments: Technically difficult study due to poor echo windows. Image acquisition challenging due to patient body habitus. IMPRESSIONS  1. Left ventricular ejection fraction, by estimation, is 45 to 50%. Left ventricular ejection fraction by 2D MOD biplane is 46.2 %. The left ventricle has mildly decreased function. The left ventricle demonstrates global hypokinesis. Indeterminate diastolic filling due to E-A fusion.  2. Right ventricular systolic function is normal. The right ventricular size is moderately enlarged. There is mildly elevated pulmonary artery systolic pressure. The estimated right ventricular systolic pressure is 62.6 mmHg.  3. Right atrial size was severely dilated.  4. The mitral valve is abnormal. Mild mitral valve regurgitation.  5. Tricuspid valve regurgitation is mild to moderate.  6.  The aortic valve is tricuspid. Aortic valve regurgitation is not visualized.  7. The inferior vena cava is dilated in size with <50% respiratory variability, suggesting right atrial pressure of 15 mmHg. Comparison(s): No prior Echocardiogram. FINDINGS  Left Ventricle: Left ventricular ejection fraction, by estimation, is 45 to 50%. Left ventricular ejection fraction by 2D MOD biplane is 46.2 %. The left ventricle has mildly decreased function. The left ventricle demonstrates global hypokinesis. Definity contrast agent was given IV to delineate the left ventricular endocardial borders. The left ventricular  internal cavity size was normal in size. There is no left ventricular hypertrophy. Indeterminate diastolic filling due to E-A fusion. Right Ventricle: The right ventricular size is moderately enlarged. No increase in right ventricular wall thickness. Right ventricular systolic function is normal. There is mildly elevated pulmonary artery systolic pressure. The tricuspid regurgitant velocity is 2.35 m/s, and with an assumed right atrial pressure of 15 mmHg, the estimated right ventricular systolic pressure is 28.4 mmHg. Left Atrium: Left atrial size was normal in size. Right Atrium: Right atrial size was severely dilated. Pericardium: There is no evidence of pericardial effusion. Mitral Valve: The mitral valve is abnormal. There is mild thickening of the mitral valve leaflet(s). Mild mitral valve regurgitation, with posteriorly-directed jet. Tricuspid Valve: The tricuspid valve is grossly normal. Tricuspid valve regurgitation is mild to moderate. Aortic Valve: The aortic valve is tricuspid. Aortic valve regurgitation is not visualized. Pulmonic Valve: The pulmonic valve was grossly normal. Pulmonic valve regurgitation is trivial. Aorta: The aortic root and ascending aorta are structurally normal, with no evidence of dilitation. Venous: The inferior vena cava is dilated in size with less than 50% respiratory variability, suggesting right atrial pressure of 15 mmHg. IAS/Shunts: No atrial level shunt detected by color flow Doppler.  LEFT VENTRICLE PLAX 2D                        Biplane EF (MOD) LVIDd:         5.80 cm         LV Biplane EF:   Left LVIDs:         4.40 cm                          ventricular LV PW:         0.90 cm                          ejection LV IVS:        0.90 cm                          fraction by LVOT diam:     2.10 cm                          2D MOD LV SV:         46                               biplane is LV SV Index:   18                               46.2 %. LVOT Area:     3.46 cm  LV  Volumes (  MOD) LV vol d, MOD    141.0 ml A2C: LV vol d, MOD    117.0 ml A4C: LV vol s, MOD    81.6 ml A2C: LV vol s, MOD    58.5 ml A4C: LV SV MOD A2C:   59.4 ml LV SV MOD A4C:   117.0 ml LV SV MOD BP:    59.2 ml RIGHT VENTRICLE RV S prime:     11.10 cm/s TAPSE (M-mode): 1.6 cm LEFT ATRIUM             Index        RIGHT ATRIUM           Index LA diam:        4.30 cm 1.64 cm/m   RA Area:     35.80 cm LA Vol (A2C):   69.8 ml 26.63 ml/m  RA Volume:   137.00 ml 52.28 ml/m LA Vol (A4C):   80.0 ml 30.53 ml/m LA Biplane Vol: 78.1 ml 29.80 ml/m  AORTIC VALVE LVOT Vmax:   92.90 cm/s LVOT Vmean:  59.200 cm/s LVOT VTI:    0.134 m  AORTA Ao Root diam: 3.20 cm Ao Asc diam:  3.50 cm MR Peak grad:    75.2 mmHg    TRICUSPID VALVE MR Mean grad:    44.5 mmHg    TR Peak grad:   22.1 mmHg MR Vmax:         433.50 cm/s  TR Vmax:        235.00 cm/s MR Vmean:        310.0 cm/s MR PISA:         0.77 cm     SHUNTS MR PISA Eff ROA: 7 mm        Systemic VTI:  0.13 m MR PISA Radius:  0.35 cm      Systemic Diam: 2.10 cm Lyman Bishop MD Electronically signed by Lyman Bishop MD Signature Date/Time: 04/17/2021/2:32:30 PM    Final    VAS [place] LOWER EXTREMITY VENOUS (DVT)  Result Date: 04/17/2021  Lower Venous DVT Study Patient Name:  JAZZALYN LOEWENSTEIN  Date of Exam:   04/17/2021 Medical Rec #: [id number]         Accession #:    [id number] Date of Birth: 11-06-1960         Patient Gender: F Patient Age:   62 years Exam Location:  [facility] Procedure:      VAS [place] LOWER EXTREMITY VENOUS (DVT) Referring Phys: Nyoka Lint DOUTOVA --------------------------------------------------------------------------------  Indications: Edema.  Limitations: Body habitus and poor ultrasound/tissue interface. Performing Technologist: Archie Patten RVS  Examination Guidelines: A complete evaluation includes B-mode imaging, spectral Doppler, color Doppler, and power Doppler as needed of all accessible portions of each vessel. Bilateral testing is  considered an integral part of a complete examination. Limited examinations for reoccurring indications may be performed as noted. The reflux portion of the exam is performed with the patient in reverse Trendelenburg.  +---------+---------------+---------+-----------+----------+-------------------+  RIGHT     Compressibility Phasicity Spontaneity Properties Thrombus Aging       +---------+---------------+---------+-----------+----------+-------------------+  CFV       Full            Yes       Yes                                         +---------+---------------+---------+-----------+----------+-------------------+  SFJ       Full                                                                  +---------+---------------+---------+-----------+----------+-------------------+  FV Prox   Full                                                                  +---------+---------------+---------+-----------+----------+-------------------+  FV Mid                                                     Not well visualized  +---------+---------------+---------+-----------+----------+-------------------+  FV Distal                                                  Not well visualized  +---------+---------------+---------+-----------+----------+-------------------+  PFV       Full                                                                  +---------+---------------+---------+-----------+----------+-------------------+  POP       Full                                                                  +---------+---------------+---------+-----------+----------+-------------------+  PTV                                                        patent by color      +---------+---------------+---------+-----------+----------+-------------------+  PERO                                                       Not well visualized  +---------+---------------+---------+-----------+----------+-------------------+    +---------+---------------+---------+-----------+----------+-------------------+  LEFT      Compressibility Phasicity Spontaneity Properties Thrombus Aging       +---------+---------------+---------+-----------+----------+-------------------+  CFV       Full            Yes       Yes                                         +---------+---------------+---------+-----------+----------+-------------------+  SFJ       Full                                                                  +---------+---------------+---------+-----------+----------+-------------------+  FV Prox   Full                                                                  +---------+---------------+---------+-----------+----------+-------------------+  FV Mid                                                     Not well visualized  +---------+---------------+---------+-----------+----------+-------------------+  FV Distal                                                  Not well visualized  +---------+---------------+---------+-----------+----------+-------------------+  PFV       Full                                                                  +---------+---------------+---------+-----------+----------+-------------------+  POP       Full                                                                  +---------+---------------+---------+-----------+----------+-------------------+  PTV                                                        Not well visualized  +---------+---------------+---------+-----------+----------+-------------------+  PERO                                                       Not well visualized  +---------+---------------+---------+-----------+----------+-------------------+     Summary: RIGHT: - There is no evidence of deep vein thrombosis in the lower extremity. However, portions of this examination were limited- see technologist comments above.  - No cystic structure found in the popliteal fossa.  LEFT: - There is  no evidence of deep vein thrombosis in the lower extremity. However, portions of this examination were limited- see  technologist comments above.  - No cystic structure found in the popliteal fossa.  *See table(s) above for measurements and observations. Electronically signed by Monica Martinez MD on 04/17/2021 at 7:09:27 PM.    Final     Cardiac Studies   See echo  Assessment   Principal Problem:   Anasarca Active Problems:   Lower urinary tract infectious disease   Morbid obesity ([place])   Vaginal bleeding   Iron deficiency anemia due to chronic blood loss   Cardiomegaly   Chest pain   Atrial flutter ([place])   CAP (community acquired pneumonia)   Pressure injury of skin   Plan   Anasarca Ms. Lupita Shutter is massively volume overloaded and presents with acute on chronic edema and appears to have chronic venous stasis changes.  CT and chest x-ray demonstrated cardiomegaly and BNP is elevated suggestive of heart failure.  Echocardiogram is shows mildly reduced LVEF to 45-50%, global HK. ?related to arrythmia - possible atrial tach. Will further increase lopressor today. Excellent diuresis of almost 4L - renal function stable. Continue TID lasix. Replete magnesium 2G today. Probable sinus tachycardia vs. ectopic atrial tachycardia EKG to me appears more to be a sinus tachycardia or perhaps ectopic atrial tachycardia.  She has upright P waves which are different than her T waves noted in V1 and V2.  Rate has been pretty consistent, more favoring arrhythmia. She has been started on low-dose metoprolol and I will increase it today. Community-acquired pneumonia Started on azithromycin and ceftriaxone for community-acquired pneumonia per hospital medicine. Vaginal bleeding Will need outpatient work-up including vaginal ultrasound.  Time Spent Directly with Patient:  I have spent a total of 25 minutes with the patient reviewing hospital notes, telemetry, EKGs, labs and examining the patient as  well as establishing an assessment and plan that was discussed personally with the patient.  > 50% of time was spent in direct patient care.  Length of Stay:  LOS: 1 day   Pixie Casino, MD, [facility], [place] Director of the Advanced Lipid Disorders &  Cardiovascular Risk Reduction Clinic Diplomate of the American Board of Clinical Lipidology Attending Cardiologist  Direct Dial: [phone]   Fax: [phone]  Website:  [email or web address] [email or web address] Teala Daffron 04/18/2021, 9:07 AM

## 2021-04-19 LAB — CBC
HCT: 35.9 % — ABNORMAL LOW (ref 36.0–46.0)
Hemoglobin: 10.5 g/dL — ABNORMAL LOW (ref 12.0–15.0)
MCH: 22.3 pg — ABNORMAL LOW (ref 26.0–34.0)
MCHC: 29.2 g/dL — ABNORMAL LOW (ref 30.0–36.0)
MCV: 76.2 fL — ABNORMAL LOW (ref 80.0–100.0)
Platelets: 332 10*3/uL (ref 150–400)
RBC: 4.71 MIL/uL (ref 3.87–5.11)
RDW: 20.9 % — ABNORMAL HIGH (ref 11.5–15.5)
WBC: 4.8 10*3/uL (ref 4.0–10.5)
nRBC: 0 % (ref 0.0–0.2)

## 2021-04-19 LAB — BASIC METABOLIC PANEL
Anion gap: 7 (ref 5–15)
BUN: 14 mg/dL (ref 6–20)
CO2: 32 mmol/L (ref 22–32)
Calcium: 9 mg/dL (ref 8.9–10.3)
Chloride: 99 mmol/L (ref 98–111)
Creatinine, Ser: 0.78 mg/dL (ref 0.44–1.00)
GFR, Estimated: >60 mL/min (ref >60)
Glucose, Bld: 88 mg/dL (ref 70–99)
Potassium: 3.4 mmol/L — ABNORMAL LOW (ref 3.5–5.1)
Sodium: 138 mmol/L (ref 135–145)

## 2021-04-19 LAB — MAGNESIUM: Magnesium: 1.8 mg/dL (ref 1.7–2.4)

## 2021-04-19 MED ORDER — ENSURE MAX PROTEIN PO LIQD
11.0000 [oz_av] | Freq: Every day | ORAL | Status: DC
  Administered 2021-04-20 – 2021-05-06 (×15): 11 [oz_av] via ORAL
  Filled 2021-04-19 (×18): qty 330

## 2021-04-19 MED ORDER — PROSOURCE PLUS PO LIQD
30.0000 mL | Freq: Two times a day (BID) | ORAL | Status: DC
  Administered 2021-04-19 – 2021-05-06 (×33): 30 mL via ORAL
  Filled 2021-04-19 (×32): qty 30

## 2021-04-19 MED ORDER — POTASSIUM CHLORIDE CRYS ER 20 MEQ PO TBCR
20.0000 meq | EXTENDED_RELEASE_TABLET | Freq: Three times a day (TID) | ORAL | Status: DC
  Administered 2021-04-19 – 2021-04-27 (×22): 20 meq via ORAL
  Filled 2021-04-19 (×23): qty 1

## 2021-04-19 MED ORDER — POTASSIUM CHLORIDE CRYS ER 20 MEQ PO TBCR: 20.0000 meq | EXTENDED_RELEASE_TABLET | Freq: Two times a day (BID) | ORAL | Status: DC

## 2021-04-19 MED ORDER — MAGNESIUM SULFATE 2 GM/50ML IV SOLN
2.0000 g | Freq: Once | INTRAVENOUS | Status: AC
  Administered 2021-04-19: 2 g via INTRAVENOUS
  Filled 2021-04-19: qty 50

## 2021-04-19 MED ORDER — METOPROLOL TARTRATE 50 MG PO TABS
50.0000 mg | ORAL_TABLET | Freq: Two times a day (BID) | ORAL | Status: DC
  Administered 2021-04-19 – 2021-04-26 (×12): 50 mg via ORAL
  Filled 2021-04-19 (×14): qty 1

## 2021-04-19 NOTE — TOC Initial Note (Signed)
Transition of Care [facility]) - Initial/Assessment Note    Patient Details  Name: Amber Herring MRN: [id number] Date of Birth: Oct 03, 1960  [facility] CM/SW Contact:    Amber Mage, LCSW Phone Number: 04/19/2021, 2:40 PM  Clinical Narrative:   Patient seen in follow up to MD consult re: homelessness.  Amber Herring states that previous to this illness, she was an over the road truck driver, and, as such, kept no residence of her own.  On the three down days she had a month, she stayed with her daughter.  Now that she is unable to work, she has overstayed her welcome with her daughter, and has no income. She inquired about disability, and I explained that it is a lengthy process, but we could get it started with a referral to Financial Navigator. I will also get her a number for Housing Authority subsidized housing.  Finally, I will make a referral to HOPEs to see if she might be eligible. TOC will continue to follow during the course of hospitalization.               Expected Discharge Plan: Home/Self Care Barriers to Discharge: No Barriers Identified   Patient Goals and CMS Choice        Expected Discharge Plan and Services Expected Discharge Plan: Home/Self Care In-house Referral: Clinical Social Work     Living arrangements for the past 2 months: Apartment                                      Prior Living Arrangements/Services Living arrangements for the past 2 months: Apartment Lives with:: Adult Children Patient language and need for interpreter reviewed:: Yes        Need for Family Participation in Patient Care: Yes (Comment) Care giver support system in place?: Yes (comment)   Criminal Activity/Legal Involvement Pertinent to Current Situation/Hospitalization: No - Comment as needed  Activities of Daily Living Home Assistive Devices/Equipment: Cane (specify quad or straight), Eyeglasses ADL Screening (condition at time of admission) Patient's  cognitive ability adequate to safely complete daily activities?: Yes Is the patient deaf or have difficulty hearing?: No Does the patient have difficulty seeing, even when wearing glasses/contacts?: No Does the patient have difficulty concentrating, remembering, or making decisions?: No Patient able to express need for assistance with ADLs?: Yes Does the patient have difficulty dressing or bathing?: Yes Independently performs ADLs?: No Communication: Independent Dressing (OT): Needs assistance Is this a change from baseline?: Change from baseline, expected to last >3 days Grooming: Independent with device (comment) Feeding: Independent Bathing: Needs assistance Is this a change from baseline?: Change from baseline, expected to last >3 days Toileting: Needs assistance Is this a change from baseline?: Change from baseline, expected to last >3days In/Out Bed: Needs assistance Is this a change from baseline?: Change from baseline, expected to last >3 days Walks in Home: Needs assistance Is this a change from baseline?: Change from baseline, expected to last >3 days Does the patient have difficulty walking or climbing stairs?: Yes Weakness of Legs: Both Weakness of Arms/Hands: None  Permission Sought/Granted                  Emotional Assessment Appearance:: Appears stated age Attitude/Demeanor/Rapport: Engaged Affect (typically observed): Appropriate Orientation: : Oriented to Self, Oriented to Place, Oriented to  Time, Oriented to Situation Alcohol / Substance Use: Not Applicable Psych  Involvement: No (comment)  Admission diagnosis:  Anasarca [R60.1] Vaginal bleeding [N93.9] Dyspnea [R06.00] Urinary tract infection with hematuria, site unspecified [N39.0, R31.9] Patient Active Problem List   Diagnosis Date Noted   Atrial flutter ([place]) 04/17/2021   CAP (community acquired pneumonia) 04/17/2021   Pressure injury of skin 04/17/2021   Anasarca 04/16/2021   Cardiomegaly  04/16/2021   Chest pain 04/16/2021   Symptomatic anemia 01/25/2021   Morbid obesity ([place]) 01/25/2021   Vaginal bleeding 01/25/2021   Iron deficiency anemia due to chronic blood loss 01/25/2021   Hypokalemia 01/25/2021   Facial swelling 11/17/2006   Headache(784.0) 05/19/2005   Stress at work 11/17/2003   Lower urinary tract infectious disease 10/16/1997   Knee pain 06/16/1996   Sinusitis 02/17/1996   Back pain 10/16/1992   PCP:  Amber Lung, MD Pharmacy:   [id number] [place], [place] [address]Phone: [phone]:: [phone]    Social Determinants of Health (SDOH) Interventions    Readmission Risk Interventions No flowsheet data found.

## 2021-04-19 NOTE — Progress Notes (Signed)
Amber Herring  IRS:[id number] DOB: Aug 05, 1960 DOA: 04/16/2021 PCP: Denita Lung, MD    Brief Narrative:  61yo with a history of morbid obesity, depression, chronic anemia, GERD, and HLD who presented to the ED with severe generalized swelling primarily of the legs and abdomen.  ROS was also positive for intermittent dysfunctional vaginal bleeding.  Consultants:  Cardiology  Code Status: FULL CODE  Antimicrobials:  Rocephin 12/30  Azithromycin 12/30   DVT prophylaxis: SCDs  Interim history: Afebrile.  Heart rate remains elevated at 106.  Blood pressure stable.  Saturation 92% on room air.  Net negative approximately 13 L since admission.  No new complaints today.  Denies chest pain shortness of breath nausea or vomiting.  Assessment & Plan:  Anasarca - newly diagnosed mild acute systolic CHF -probable pulmonary hypertension TTE noted EF 45-50% with global hypokinesis and mildly elevated PASP - aggressive IV diuresis to continue - net negative approximately 13L since admission - likely driven by sleep apnea +/- arrhythmia  Sinus Tachycardia versus ectopic atrial tachycardia Beta-blocker being titrated -TSH normal  Mild hypokalemia Due to ongoing aggressive diuresis -supplement and follow  Mild hypomagnesemia Goal is magnesium 2.0 -supplement and follow  Morbid obesity - Body mass index is 51.69 kg/m.  Vaginal bleeding Will need outpatient evaluation -monitor hemoglobin for now - limited pelvic US this admit without acute findings, but was not able to visualize all structures/endometrium -no evidence of significant ongoing bleeding presently  Iron deficiency anemia Iron low at presentation with normal TIBC and ferritin of 8 - monitor Hgb during admit - infused IV iron 04/18/21  ?  UTI No clear sx to suggest a urinary tract infection - follow clinically w/o abx for now   PNA ruled out  With normal procalcitonin, normal WBC, and pleural effusions I doubt this  represents an infectious pneumonia - stopped abx - f/u procalcitonin remains normal   Family Communication: no family present at time of exam  Disposition: Eventual discharge home when diuresis completed   Objective: Blood pressure 111/72, pulse (!) 106, temperature 98.7 F (37.1 C), temperature source Oral, resp. rate 18, height _0  (1.753 m), weight (!) 158.8 kg, SpO2 92 %.  Intake/Output Summary (Last 24 hours) at 04/19/2021 1015 Last data filed at 04/19/2021 0019 Gross per 24 hour  Intake --  Output 7800 ml  Net -7800 ml    Filed Weights   04/16/21 1509  Weight: (!) 158.8 kg    Examination: General: No acute respiratory distress Lungs: poor air movement B bases -no wheezing Cardiovascular: distant HS - no appreciable M -remains tachycardic but regular Abdomen: morbidly obese - soft - BS+ - edema of abdom wall  Extremities: Significant improvement in lower extremity edema which is now only 2+ to upper thigh  CBC: Recent Labs  Lab 04/16/21 1721 04/17/21 0525 04/18/21 0553 04/19/21 0531  WBC 5.1 4.7 5.4 4.8  NEUTROABS 3.0 2.7  --   --   HGB 10.9* 10.4* 10.4* 10.5*  HCT 37.5 35.7* 36.3 35.9*  MCV 77.2* 77.3* 77.7* 76.2*  PLT 315 323 323 009    Basic Metabolic Panel: Recent Labs  Lab 04/17/21 0525 04/18/21 0553 04/19/21 0531  NA 140 140 138  K 3.7 3.6 3.4*  CL 107 102 99  CO2 27 29 32  GLUCOSE 96 99 88  BUN _1 CREATININE 0.71 0.79 0.78  CALCIUM 8.6* 9.0 9.0  MG 1.8 1.8 1.8  PHOS 4.2  --   --  GFR: Estimated Creatinine Clearance: 124.9 mL/min (by C-G formula based on SCr of 0.78 mg/dL).  Liver Function Tests: Recent Labs  Lab 04/16/21 1721 04/17/21 0525 04/18/21 0553  AST _0 ALT _1 ALKPHOS 87 77 77  BILITOT 1.8* 1.3* 1.2  PROT 7.5 7.0 7.3  ALBUMIN 3.5 3.3* 3.2*     Recent Results (from the past 240 hour(s))  Resp Panel by RT-PCR (Flu A&B, Covid) Nasopharyngeal Swab     Status: None   Collection Time: 04/16/21   5:20 PM   Specimen: Nasopharyngeal Swab; Nasopharyngeal(NP) swabs in vial transport medium  Result Value Ref Range Status   SARS Coronavirus 2 by RT PCR NEGATIVE NEGATIVE Final    Comment: (NOTE) SARS-CoV-2 target nucleic acids are NOT DETECTED.  The SARS-CoV-2 RNA is generally detectable in upper respiratory specimens during the acute phase of infection. The lowest concentration of SARS-CoV-2 viral copies this assay can detect is 138 copies/mL. A negative result does not preclude SARS-Cov-2 infection and should not be used as the sole basis for treatment or other patient management decisions. A negative result may occur with  improper specimen collection/handling, submission of specimen other than nasopharyngeal swab, presence of viral mutation(s) within the areas targeted by this assay, and inadequate number of viral copies(<138 copies/mL). A negative result must be combined with clinical observations, patient history, and epidemiological information. The expected result is Negative.  Fact Sheet for Patients:  EntrepreneurPulse.com.au  Fact Sheet for Healthcare Providers:  IncredibleEmployment.be  This test is no t yet approved or cleared by the [place] FDA and  has been authorized for detection and/or diagnosis of SARS-CoV-2 by FDA under an Emergency Use Authorization (EUA). This EUA will remain  in effect (meaning this test can be used) for the duration of the COVID-19 declaration under Section 564(b)(1) of the Act, 21 U.S.C.section 360bbb-3(b)(1), unless the authorization is terminated  or revoked sooner.       Influenza A by PCR NEGATIVE NEGATIVE Final   Influenza B by PCR NEGATIVE NEGATIVE Final    Comment: (NOTE) The Xpert Xpress SARS-CoV-2/FLU/RSV plus assay is intended as an aid in the diagnosis of influenza from Nasopharyngeal swab specimens and should not be used as a sole basis for treatment. Nasal washings and aspirates  are unacceptable for Xpert Xpress SARS-CoV-2/FLU/RSV testing.  Fact Sheet for Patients: EntrepreneurPulse.com.au  Fact Sheet for Healthcare Providers: IncredibleEmployment.be  This test is not yet approved or cleared by the [place] FDA and has been authorized for detection and/or diagnosis of SARS-CoV-2 by FDA under an Emergency Use Authorization (EUA). This EUA will remain in effect (meaning this test can be used) for the duration of the COVID-19 declaration under Section 564(b)(1) of the Act, 21 U.S.C. section 360bbb-3(b)(1), unless the authorization is terminated or revoked.  Performed at [facility], [place] [address], [place], [place] [address]       Scheduled Meds:  furosemide  40 mg Intravenous TID   metoprolol tartrate  25 mg Oral BID   potassium chloride  20 mEq Oral BID   sodium chloride flush  3 mL Intravenous Q12H   Continuous Infusions:  sodium chloride       LOS: 2 days   Cherene Altes, MD [facility] Office  [phone] Pager - Text Page per Shea Evans  If 7PM-7AM, please contact night-coverage per Amion 04/19/2021, 10:15 AM

## 2021-04-19 NOTE — Progress Notes (Signed)
Progress Note  Patient Name: Amber Herring Date of Encounter: 04/19/2021  Primary Cardiologist: None   Subjective   Patient seen examined at her bedside.  She was awake when I arrived on the telephone.  She tells me that the shortness of breath is improving but not yet fully resolved.  Patient no longer on telemetry.  Net negative over the last 24 hours -9800 mL.  Inpatient Medications    Scheduled Meds:  furosemide  40 mg Intravenous TID   metoprolol tartrate  25 mg Oral BID   sodium chloride flush  3 mL Intravenous Q12H   Continuous Infusions:  sodium chloride     PRN Meds: sodium chloride, acetaminophen **OR** [DISCONTINUED] acetaminophen, oxyCODONE, sodium chloride flush, traMADol   Vital Signs    Vitals:   04/18/21 0603 04/18/21 1348 04/18/21 1949 04/19/21 0532  BP: (!) 109/55 120/84 119/86 111/72  Pulse: (!) 110 (!) 110 (!) 105 (!) 106  Resp: _0 Temp: 98.2 F (36.8 C) 97.6 F (36.4 C) 98.2 F (36.8 C) 98.7 F (37.1 C)  TempSrc: Oral Oral Oral Oral  SpO2: 94% 90% 93% 92%  Weight:      Height:        Intake/Output Summary (Last 24 hours) at 04/19/2021 5953 Last data filed at 04/19/2021 0019 Gross per 24 hour  Intake --  Output 9800 ml  Net -9800 ml   Filed Weights   04/16/21 1509  Weight: (!) 158.8 kg    Telemetry    Not on tele this morning - Personally Reviewed  ECG    No new ekg today - Personally Reviewed  Physical Exam    General: Comfortable, not in any acute distress Head: Atraumatic, normal size, face with freckles Eyes: PEERLA, EOMI  Neck: Supple, normal JVD Cardiac: Normal S1, S2; RRR; no murmurs, rubs, or gallops Lungs: Clear to auscultation bilaterally Abd: Soft, nontender, no hepatomegaly  Ext: warm, +3 bilateral lower extremity edema, swollen feet, also noted changes for venous stasis Musculoskeletal: No deformities, BUE and BLE strength normal and equal Skin: Warm and dry, no rashes   Neuro: Alert and oriented to  person, place, time, and situation, CNII-XII grossly intact, no focal deficits  Psych: Normal mood and affect   Labs    Chemistry Recent Labs  Lab 04/16/21 1721 04/17/21 0525 04/18/21 0553 04/19/21 0531  NA 139 140 140 138  K 3.7 3.7 3.6 3.4*  CL 105 107 102 99  CO2 _1 32  GLUCOSE 79 96 99 88  BUN _2 CREATININE 0.78 0.71 0.79 0.78  CALCIUM 8.7* 8.6* 9.0 9.0  PROT 7.5 7.0 7.3  --   ALBUMIN 3.5 3.3* 3.2*  --   AST _3 --   ALT _4 --   ALKPHOS 87 77 77  --   BILITOT 1.8* 1.3* 1.2  --   GFRNONAA >60 >60 >60 >60  ANIONGAP _5 Hematology Recent Labs  Lab 04/17/21 0525 04/18/21 0553 04/19/21 0531  WBC 4.7 5.4 4.8  RBC 4.62   4.62 4.67 4.71  HGB 10.4* 10.4* 10.5*  HCT 35.7* 36.3 35.9*  MCV 77.3* 77.7* 76.2*  MCH 22.5* 22.3* 22.3*  MCHC 29.1* 28.7* 29.2*  RDW 21.4* 21.3* 20.9*  PLT 323 323 332    Cardiac EnzymesNo results for input(s): TROPONINI in the last 168 hours. No results for input(s): TROPIPOC in the last  168 hours.   BNP Recent Labs  Lab 04/16/21 1722  BNP 322.0*     DDimer  Recent Labs  Lab 04/17/21 0220  DDIMER 1.62*     Radiology    ECHOCARDIOGRAM COMPLETE  Result Date: 04/17/2021    ECHOCARDIOGRAM REPORT   Patient Name:   Amber Herring Date of Exam: 04/17/2021 Medical Rec #:  [id number]        Height:       69.0 in Accession #:    [id number]       Weight:       350.0 lb Date of Birth:  08-Jun-1960        BSA:          2.621 m Patient Age:    61 years         BP:           100/70 mmHg Patient Gender: F                HR:           110 bpm. Exam Location:  Inpatient Procedure: 2D Echo, Cardiac Doppler, Color Doppler and Intracardiac            Opacification Agent Indications:    Cardiomegaly I51.7  History:        Patient has no prior history of Echocardiogram examinations.                 Risk Factors:Dyslipidemia. GERD.  Sonographer:    Darlina Sicilian RDCS Referring Phys: [place]  Sonographer  Comments: Technically difficult study due to poor echo windows. Image acquisition challenging due to patient body habitus. IMPRESSIONS  1. Left ventricular ejection fraction, by estimation, is 45 to 50%. Left ventricular ejection fraction by 2D MOD biplane is 46.2 %. The left ventricle has mildly decreased function. The left ventricle demonstrates global hypokinesis. Indeterminate diastolic filling due to E-A fusion.  2. Right ventricular systolic function is normal. The right ventricular size is moderately enlarged. There is mildly elevated pulmonary artery systolic pressure. The estimated right ventricular systolic pressure is 56.2 mmHg.  3. Right atrial size was severely dilated.  4. The mitral valve is abnormal. Mild mitral valve regurgitation.  5. Tricuspid valve regurgitation is mild to moderate.  6. The aortic valve is tricuspid. Aortic valve regurgitation is not visualized.  7. The inferior vena cava is dilated in size with <50% respiratory variability, suggesting right atrial pressure of 15 mmHg. Comparison(s): No prior Echocardiogram. FINDINGS  Left Ventricle: Left ventricular ejection fraction, by estimation, is 45 to 50%. Left ventricular ejection fraction by 2D MOD biplane is 46.2 %. The left ventricle has mildly decreased function. The left ventricle demonstrates global hypokinesis. Definity contrast agent was given IV to delineate the left ventricular endocardial borders. The left ventricular internal cavity size was normal in size. There is no left ventricular hypertrophy. Indeterminate diastolic filling due to E-A fusion. Right Ventricle: The right ventricular size is moderately enlarged. No increase in right ventricular wall thickness. Right ventricular systolic function is normal. There is mildly elevated pulmonary artery systolic pressure. The tricuspid regurgitant velocity is 2.35 m/s, and with an assumed right atrial pressure of 15 mmHg, the estimated right ventricular systolic pressure is 13.0  mmHg. Left Atrium: Left atrial size was normal in size. Right Atrium: Right atrial size was severely dilated. Pericardium: There is no evidence of pericardial effusion. Mitral Valve: The mitral valve is abnormal. There is mild thickening of the mitral valve  leaflet(s). Mild mitral valve regurgitation, with posteriorly-directed jet. Tricuspid Valve: The tricuspid valve is grossly normal. Tricuspid valve regurgitation is mild to moderate. Aortic Valve: The aortic valve is tricuspid. Aortic valve regurgitation is not visualized. Pulmonic Valve: The pulmonic valve was grossly normal. Pulmonic valve regurgitation is trivial. Aorta: The aortic root and ascending aorta are structurally normal, with no evidence of dilitation. Venous: The inferior vena cava is dilated in size with less than 50% respiratory variability, suggesting right atrial pressure of 15 mmHg. IAS/Shunts: No atrial level shunt detected by color flow Doppler.  LEFT VENTRICLE PLAX 2D                        Biplane EF (MOD) LVIDd:         5.80 cm         LV Biplane EF:   Left LVIDs:         4.40 cm                          ventricular LV PW:         0.90 cm                          ejection LV IVS:        0.90 cm                          fraction by LVOT diam:     2.10 cm                          2D MOD LV SV:         46                               biplane is LV SV Index:   18                               46.2 %. LVOT Area:     3.46 cm  LV Volumes (MOD) LV vol d, MOD    141.0 ml A2C: LV vol d, MOD    117.0 ml A4C: LV vol s, MOD    81.6 ml A2C: LV vol s, MOD    58.5 ml A4C: LV SV MOD A2C:   59.4 ml LV SV MOD A4C:   117.0 ml LV SV MOD BP:    59.2 ml RIGHT VENTRICLE RV S prime:     11.10 cm/s TAPSE (M-mode): 1.6 cm LEFT ATRIUM             Index        RIGHT ATRIUM           Index LA diam:        4.30 cm 1.64 cm/m   RA Area:     35.80 cm LA Vol (A2C):   69.8 ml 26.63 ml/m  RA Volume:   137.00 ml 52.28 ml/m LA Vol (A4C):   80.0 ml 30.53 ml/m LA Biplane  Vol: 78.1 ml 29.80 ml/m  AORTIC VALVE LVOT Vmax:   92.90 cm/s LVOT Vmean:  59.200 cm/s LVOT VTI:    0.134 m  AORTA Ao Root diam: 3.20 cm Ao Asc diam:  3.50 cm  MR Peak grad:    75.2 mmHg    TRICUSPID VALVE MR Mean grad:    44.5 mmHg    TR Peak grad:   22.1 mmHg MR Vmax:         433.50 cm/s  TR Vmax:        235.00 cm/s MR Vmean:        310.0 cm/s MR PISA:         0.77 cm     SHUNTS MR PISA Eff ROA: 7 mm        Systemic VTI:  0.13 m MR PISA Radius:  0.35 cm      Systemic Diam: 2.10 cm Lyman Bishop MD Electronically signed by Lyman Bishop MD Signature Date/Time: 04/17/2021/2:32:30 PM    Final    VAS [place] LOWER EXTREMITY VENOUS (DVT)  Result Date: 04/17/2021  Lower Venous DVT Study Patient Name:  SUNDI SLEVIN  Date of Exam:   04/17/2021 Medical Rec #: [id number]         Accession #:    [id number] Date of Birth: May 07, 1960         Patient Gender: F Patient Age:   62 years Exam Location:  [facility] Procedure:      VAS [place] LOWER EXTREMITY VENOUS (DVT) Referring Phys: Nyoka Lint DOUTOVA --------------------------------------------------------------------------------  Indications: Edema.  Limitations: Body habitus and poor ultrasound/tissue interface. Performing Technologist: Archie Patten RVS  Examination Guidelines: A complete evaluation includes B-mode imaging, spectral Doppler, color Doppler, and power Doppler as needed of all accessible portions of each vessel. Bilateral testing is considered an integral part of a complete examination. Limited examinations for reoccurring indications may be performed as noted. The reflux portion of the exam is performed with the patient in reverse Trendelenburg.  +---------+---------------+---------+-----------+----------+-------------------+  RIGHT     Compressibility Phasicity Spontaneity Properties Thrombus Aging       +---------+---------------+---------+-----------+----------+-------------------+  CFV       Full            Yes       Yes                                          +---------+---------------+---------+-----------+----------+-------------------+  SFJ       Full                                                                  +---------+---------------+---------+-----------+----------+-------------------+  FV Prox   Full                                                                  +---------+---------------+---------+-----------+----------+-------------------+  FV Mid                                                     Not well visualized  +---------+---------------+---------+-----------+----------+-------------------+  FV Distal                                                  Not well visualized  +---------+---------------+---------+-----------+----------+-------------------+  PFV       Full                                                                  +---------+---------------+---------+-----------+----------+-------------------+  POP       Full                                                                  +---------+---------------+---------+-----------+----------+-------------------+  PTV                                                        patent by color      +---------+---------------+---------+-----------+----------+-------------------+  PERO                                                       Not well visualized  +---------+---------------+---------+-----------+----------+-------------------+   +---------+---------------+---------+-----------+----------+-------------------+  LEFT      Compressibility Phasicity Spontaneity Properties Thrombus Aging       +---------+---------------+---------+-----------+----------+-------------------+  CFV       Full            Yes       Yes                                         +---------+---------------+---------+-----------+----------+-------------------+  SFJ       Full                                                                   +---------+---------------+---------+-----------+----------+-------------------+  FV Prox   Full                                                                  +---------+---------------+---------+-----------+----------+-------------------+  FV Mid  Not well visualized  +---------+---------------+---------+-----------+----------+-------------------+  FV Distal                                                  Not well visualized  +---------+---------------+---------+-----------+----------+-------------------+  PFV       Full                                                                  +---------+---------------+---------+-----------+----------+-------------------+  POP       Full                                                                  +---------+---------------+---------+-----------+----------+-------------------+  PTV                                                        Not well visualized  +---------+---------------+---------+-----------+----------+-------------------+  PERO                                                       Not well visualized  +---------+---------------+---------+-----------+----------+-------------------+     Summary: RIGHT: - There is no evidence of deep vein thrombosis in the lower extremity. However, portions of this examination were limited- see technologist comments above.  - No cystic structure found in the popliteal fossa.  LEFT: - There is no evidence of deep vein thrombosis in the lower extremity. However, portions of this examination were limited- see technologist comments above.  - No cystic structure found in the popliteal fossa.  *See table(s) above for measurements and observations. Electronically signed by Monica Martinez MD on 04/17/2021 at 7:09:27 PM.    Final     Cardiac Studies   TTE 04/17/2021 IMPRESSIONS   1. Left ventricular ejection fraction, by estimation, is 45 to 50%. Left ventricular ejection  fraction by 2D MOD biplane is 46.2 %. The left ventricle has mildly decreased function. The left ventricle demonstrates global hypokinesis. Indeterminate  diastolic filling due to E-A fusion.   2. Right ventricular systolic function is normal. The right ventricular size is moderately enlarged. There is mildly elevated pulmonary artery systolic pressure. The estimated right ventricular systolic pressure is 69.7 mmHg.   3. Right atrial size was severely dilated.   4. The mitral valve is abnormal. Mild mitral valve regurgitation.   5. Tricuspid valve regurgitation is mild to moderate.   6. The aortic valve is tricuspid. Aortic valve regurgitation is not  visualized.   7. The inferior vena cava is dilated in size with <50% respiratory  variability, suggesting right atrial pressure of 15 mmHg.   Comparison(s): No prior Echocardiogram.  FINDINGS   Left Ventricle: Left ventricular ejection fraction, by estimation, is 45 to 50%. Left ventricular ejection fraction by 2D MOD biplane is 46.2 %. The left ventricle has mildly decreased function. The left ventricle demonstrates global hypokinesis.  Definity contrast agent was given IV to delineate the left ventricular endocardial borders. The left ventricular internal cavity size was normal in size. There is no left ventricular hypertrophy. Indeterminate diastolic filling due to E-A fusion.   Right Ventricle: The right ventricular size is moderately enlarged. No  increase in right ventricular wall thickness. Right ventricular systolic  function is normal. There is mildly elevated pulmonary artery systolic  pressure. The tricuspid regurgitant  velocity is 2.35 m/s, and with an assumed right atrial pressure of 15  mmHg, the estimated right ventricular systolic pressure is 40.0 mmHg.   Left Atrium: Left atrial size was normal in size.   Right Atrium: Right atrial size was severely dilated.   Pericardium: There is no evidence of pericardial effusion.    Mitral Valve: The mitral valve is abnormal. There is mild thickening of  the mitral valve leaflet(s). Mild mitral valve regurgitation, with posteriorly-directed jet.   Tricuspid Valve: The tricuspid valve is grossly normal. Tricuspid valve regurgitation is mild to moderate.   Aortic Valve: The aortic valve is tricuspid. Aortic valve regurgitation is not visualized.   Pulmonic Valve: The pulmonic valve was grossly normal. Pulmonic valve  regurgitation is trivial.   Aorta: The aortic root and ascending aorta are structurally normal, with  no evidence of dilitation.   Venous: The inferior vena cava is dilated in size with less than 50%  respiratory variability, suggesting right atrial pressure of 15 mmHg.   IAS/Shunts: No atrial level shunt detected by color flow Doppler.     Patient Profile     61 y.o. female with history of iron deficiency anemia due to chronic blood loss, now with known congestive heart failure.  Assessment & Plan    Acute exacerbation of with midrange ejection fraction-patient still clinically appears to be volume overloaded.  She still needs IV Lasix.  Keep same Lasix dosing 40 mg 3 times daily.  She is responding very well to her diuretics.  Over the last 24 hours -9800 mL.  Kidney function appears to be stable.  Continue to monitor daily weight and I's and O's.  In terms of medical management she is on metoprolol tartrate 25 mg twice daily her beta-blocker is on new, once she has been diuresed and closer to her baseline it will be beneficial to transition the patient to metoprolol succinate 50 mg daily.  In addition once her blood pressure improved slightly she may be a great candidate to start Entresto hopefully by tomorrow.  Hypokalemia-please keep K above 4.  We will add KCl 20 mEq twice daily to her medication regimen  Chart reviewed EKG suspected sinus tachycardia versus ectopic atrial tachycardia-there was previous concerns of atrial flutter which I do  agree with Dr. Debara Pickett.  I reviewed her EKG this is not consistent with atrial flutter.  Continue her low-dose beta-blocker.  Community-acquired pneumonia is being managed by the primary team.  Vaginal bleeding-she will need work-up by OB/GYN in the outpatient setting for her vaginal bleeding/abnormal uterine bleeding to rule out but not limited to polyps, fibroids.  Limited pelvic ultrasound done recently not impressive.  Iron deficiency anemia-likely due to the above.  Iron infusions by the primary team.  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  I also advised the patient is important to maintain outpatient follow-up given her new diagnosis.  For questions or updates, please contact Campus Please consult [email or web address] for contact info under Cardiology/STEMI.      Signed, Berniece Salines, DO  04/19/2021, 8:26 AM

## 2021-04-19 NOTE — Progress Notes (Signed)
Initial Nutrition Assessment  DOCUMENTATION CODES:   Morbid obesity  INTERVENTION:   -Ensure MAX Protein po daily, each supplement provides 150 kcal and 30 grams of protein   -Prosource Plus PO BID, each provides 100 kcals and 15g protein  -Placed "Low Sodium Nutrition Therapy" in discharge instructions.  -Discussed nutrition recommendations with patient  NUTRITION DIAGNOSIS:   Increased nutrient needs related to wound healing as evidenced by estimated needs.  GOAL:   Patient will meet greater than or equal to 90% of their needs  MONITOR:   PO intake, Supplement acceptance, Labs, Weight trends, I & O's, Skin  REASON FOR ASSESSMENT:   Consult Other (Comment)  ASSESSMENT:   60yo with a history of morbid obesity, depression, chronic anemia, GERD, and HLD who presented to the ED with severe generalized swelling primarily of the legs and abdomen.  ROS was also positive for intermittent dysfunctional vaginal bleeding.  Patient in room, states she has a good appetite. States she had a bowl of oatmeal and some sausage for breakfast. 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 Pt states she does not eat at fast food restaurants but gets items from the grocery store. Likes to snack on fruits such as grapes. She will also buy fish to eat as well. Buys deli meats such as [place], roast beef and ham for sandwiches.  Reviewed some items to limit if she is trying to consume less sodium. Encouraged her to consume protein foods with every meal and snack. She also reports she likes to eat lemons with salt sprinkled on them but she understands that this is something she can't consume.  Will place low sodium diet handout in discharge instructions. Will order protein supplements to aid in healing of left thigh pressure injury.  Per weight records, pt has lost 9 lbs since 10/10 (2% wt loss x 3 months, insignificant for time frame).  Per nursing documentation, pt with  severe BLE edema and moderate generalized edema.  Medications: IV Lasix, KLOR-CON, IV Mg sulfate  Labs reviewed: Low K  NUTRITION - FOCUSED PHYSICAL EXAM:  No depletions noted.  Diet Order:   Diet Order             Diet Heart Room service appropriate? Yes; Fluid consistency: Thin; Fluid restriction: 1800 mL Fluid  Diet effective now                   EDUCATION NEEDS:   Education needs have been addressed  Skin:  Skin Assessment: Skin Integrity Issues: Skin Integrity Issues:: Stage II Stage II: left thigh  Last BM:  12/30  Height:   Ht Readings from Last 1 Encounters:  04/16/21 _0  (1.753 m)    Weight:   Wt Readings from Last 1 Encounters:  04/16/21 (!) 158.8 kg    BMI:  Body mass index is 51.69 kg/m.  Estimated Nutritional Needs:   Kcal:  2250-2450  Protein:  120-130g  Fluid:  1.8L -per fluid restriction  Clayton Bibles, MS, RD, LDN Inpatient Clinical Dietitian Contact information available via Amion

## 2021-04-20 DIAGNOSIS — I4892 Unspecified atrial flutter: Secondary | ICD-10-CM

## 2021-04-20 LAB — BASIC METABOLIC PANEL
Anion gap: 4 — ABNORMAL LOW (ref 5–15)
BUN: 14 mg/dL (ref 6–20)
CO2: 34 mmol/L — ABNORMAL HIGH (ref 22–32)
Calcium: 9 mg/dL (ref 8.9–10.3)
Chloride: 98 mmol/L (ref 98–111)
Creatinine, Ser: 0.81 mg/dL (ref 0.44–1.00)
GFR, Estimated: >60 mL/min (ref >60)
Glucose, Bld: 90 mg/dL (ref 70–99)
Potassium: 4.1 mmol/L (ref 3.5–5.1)
Sodium: 136 mmol/L (ref 135–145)

## 2021-04-20 LAB — MAGNESIUM: Magnesium: 2 mg/dL (ref 1.7–2.4)

## 2021-04-20 MED ORDER — HEPARIN SODIUM (PORCINE) 5000 UNIT/ML IJ SOLN
5000.0000 [IU] | Freq: Three times a day (TID) | INTRAMUSCULAR | Status: DC
  Administered 2021-04-20 – 2021-04-22 (×4): 5000 [IU] via SUBCUTANEOUS
  Filled 2021-04-20 (×4): qty 1

## 2021-04-20 MED ORDER — MEGESTROL ACETATE 40 MG PO TABS
40.0000 mg | ORAL_TABLET | Freq: Two times a day (BID) | ORAL | Status: DC
  Administered 2021-04-20 – 2021-05-06 (×32): 40 mg via ORAL
  Filled 2021-04-20 (×35): qty 1

## 2021-04-20 MED ORDER — FERROUS SULFATE 325 (65 FE) MG PO TABS
325.0000 mg | ORAL_TABLET | Freq: Three times a day (TID) | ORAL | Status: DC
  Administered 2021-04-20 – 2021-05-06 (×41): 325 mg via ORAL
  Filled 2021-04-20 (×42): qty 1

## 2021-04-20 MED ORDER — AMIODARONE HCL 200 MG PO TABS
400.0000 mg | ORAL_TABLET | Freq: Two times a day (BID) | ORAL | Status: DC
  Administered 2021-04-20 – 2021-04-24 (×9): 400 mg via ORAL
  Filled 2021-04-20 (×11): qty 2

## 2021-04-20 MED ORDER — FUROSEMIDE 10 MG/ML IJ SOLN
40.0000 mg | Freq: Two times a day (BID) | INTRAMUSCULAR | Status: DC
Start: 2021-04-20 — End: 2021-04-26
  Administered 2021-04-20 – 2021-04-25 (×11): 40 mg via INTRAVENOUS
  Filled 2021-04-20 (×12): qty 4

## 2021-04-20 NOTE — Progress Notes (Signed)
EKG finally crossing over, suspect atrial flutter as Dr. Stanford Breed discussed today. Plan as outlined in note. Team will f/u in AM. IM confirmed GYN consult is pending tomorrow.

## 2021-04-20 NOTE — Progress Notes (Signed)
PROGRESS NOTE    Amber Herring  MGQ:[id number] DOB: [birth year] DOA: 04/16/2021 PCP: Denita Lung, MD   Brief Narrative:  This 61 yrs old Morbidly obese female with PMH significant for depression, chronic anemia, GERD, and HLD who presented to the ED with c/o: generalized swelling primarily of the legs and abdomen.  ROS was also positive for intermittent dysfunctional vaginal bleeding.   Assessment & Plan:   Principal Problem:   Anasarca Active Problems:   Lower urinary tract infectious disease   Morbid obesity ([place])   Vaginal bleeding   Iron deficiency anemia due to chronic blood loss   Cardiomegaly   Chest pain   Atrial flutter (HCC)   CAP (community acquired pneumonia)   Pressure injury of skin   Acute systolic CHF /anasarca: Patient presented with generalized body swelling more pronounced in the legs and abdomen.  Echocardiogram shows LVEF 45 to 50% with global hypokinesis and mildly dilated PASP.  Cardiology consulted. Continue aggressive diuresis, net negative balance 13 L since admission. Continue daily weight, intake output charting. Continue IV Lasix 40 mg every 8 hours. Venous duplex negative for DVT.  Intake/Output Summary (Last 24 hours) at 04/20/2021 1220 Last data filed at 04/20/2021 0128 Gross per 24 hour  Intake 944 ml  Output 6450 ml  Net -5506 ml    Sinus tachycardia /ectopic atrial tachycardia TSH normal.  Patient does not seem to have atrial fibrillation Continue metoprolol 50 mg daily  Hypokalemia: Replaced and resolved.  Hypomagnesemia: Replaced and resolved.  Morbid obesity: Diet and exercise discussed in detail  Vaginal bleeding: Patient will need outpatient evaluation. H&H remained stable. Pelvic ultrasound without acute findings, not able to visualize all structures. No evidence of ongoing bleeding presently.  Iron deficiency anemia: Patient has received IV iron therapy. H / H stable.  UTI: No clear signs and symptoms to  suggest  UTI. Continue to monitor off antibiotics.  Pneumonia ruled out.: Normal procalcitonin, normal WBCs, Antibiotic discontinued.   DVT prophylaxis: SCDs Code Status: Full code. Family Communication:  No family at bed side. Disposition Plan:   Admitted for generalized anasarca /  CHF exacerbation requiring IV diuresis.   Consultants:  Cardiology  Procedures: Echocardiogram. Antimicrobials:   Anti-infectives (From admission, onward)    Start     Dose/Rate Route Frequency Ordered Stop   04/17/21 2100  cefTRIAXone (ROCEPHIN) 1 g in sodium chloride 0.9 % 100 mL IVPB  Status:  Discontinued        1 g 200 mL/hr over 30 Minutes Intravenous Every 24 hours 04/16/21 2129 04/17/21 1821   04/17/21 0145  azithromycin (ZITHROMAX) 500 mg in sodium chloride 0.9 % 250 mL IVPB  Status:  Discontinued        500 mg 250 mL/hr over 60 Minutes Intravenous Every 24 hours 04/17/21 0132 04/17/21 1821   04/16/21 2045  cefTRIAXone (ROCEPHIN) 1 g in sodium chloride 0.9 % 100 mL IVPB        1 g 200 mL/hr over 30 Minutes Intravenous  Once 04/16/21 2042 04/16/21 2130       Subjective: Patient was seen and examined at bedside.  Overnight events noted.   Patient reports feeling much better,  swelling has improved but she still feels very weak. She has not participated in physical therapy well because of having severe knee pain.  Objective: Vitals:   04/19/21 0532 04/19/21 1900 04/20/21 0344 04/20/21 0926  BP: 111/72 111/74 93/63 104/66  Pulse: (!) 106 100 100 (!) 105  Resp:  _0 Temp: 98.7 F (37.1 C) 97.7 F (36.5 C) 98.7 F (37.1 C)   TempSrc: Oral Oral Oral   SpO2: 92% 99% 91%   Weight:      Height:        Intake/Output Summary (Last 24 hours) at 04/20/2021 1220 Last data filed at 04/20/2021 0128 Gross per 24 hour  Intake 944 ml  Output 6450 ml  Net -5506 ml   Filed Weights   04/16/21 1509  Weight: (!) 158.8 kg    Examination:  General exam: Appears comfortable, morbidly  obese, not in any acute distress, deconditioned. Respiratory system: Decreased breath sounds bilaterally, no wheezing, no crackles. Cardiovascular system: S1-S2 heard, regular rate and rhythm, no murmur. Gastrointestinal system: Abdomen is soft, mildly distended, nontender, BS+ Central nervous system: Alert and oriented x 3 . No focal neurological deficits. Extremities: Bilateral pedal edema+, no cyanosis, no clubbing. Skin: No rashes, lesions or ulcers. Psychiatry: Judgement and insight appear normal. Mood & affect appropriate.     Data Reviewed: I have personally reviewed following labs and imaging studies  CBC: Recent Labs  Lab 04/16/21 1721 04/17/21 0525 04/18/21 0553 04/19/21 0531  WBC 5.1 4.7 5.4 4.8  NEUTROABS 3.0 2.7  --   --   HGB 10.9* 10.4* 10.4* 10.5*  HCT 37.5 35.7* 36.3 35.9*  MCV 77.2* 77.3* 77.7* 76.2*  PLT 315 323 323 751   Basic Metabolic Panel: Recent Labs  Lab 04/16/21 1721 04/17/21 0525 04/18/21 0553 04/19/21 0531 04/20/21 0433  NA 139 140 140 138 136  K 3.7 3.7 3.6 3.4* 4.1  CL 105 107 102 99 98  CO2 _1 32 34*  GLUCOSE 79 96 99 88 90  BUN _2 CREATININE 0.78 0.71 0.79 0.78 0.81  CALCIUM 8.7* 8.6* 9.0 9.0 9.0  MG  --  1.8 1.8 1.8 2.0  PHOS  --  4.2  --   --   --    GFR: Estimated Creatinine Clearance: 123.3 mL/min (by C-G formula based on SCr of 0.81 mg/dL). Liver Function Tests: Recent Labs  Lab 04/16/21 1721 04/17/21 0525 04/18/21 0553  AST _3 ALT _4 ALKPHOS 87 77 77  BILITOT 1.8* 1.3* 1.2  PROT 7.5 7.0 7.3  ALBUMIN 3.5 3.3* 3.2*   No results for input(s): LIPASE, AMYLASE in the last 168 hours. No results for input(s): AMMONIA in the last 168 hours. Coagulation Profile: No results for input(s): INR, PROTIME in the last 168 hours. Cardiac Enzymes: No results for input(s): CKTOTAL, CKMB, CKMBINDEX, TROPONINI in the last 168 hours. BNP (last 3 results) No results for input(s): PROBNP in the last  8760 hours. HbA1C: No results for input(s): HGBA1C in the last 72 hours. CBG: No results for input(s): GLUCAP in the last 168 hours. Lipid Profile: No results for input(s): CHOL, HDL, LDLCALC, TRIG, CHOLHDL, LDLDIRECT in the last 72 hours. Thyroid Function Tests: No results for input(s): TSH, T4TOTAL, FREET4, T3FREE, THYROIDAB in the last 72 hours. Anemia Panel: No results for input(s): VITAMINB12, FOLATE, FERRITIN, TIBC, IRON, RETICCTPCT in the last 72 hours. Sepsis Labs: Recent Labs  Lab 04/17/21 0525 04/18/21 0553  PROCALCITON <0.10 <0.10    Recent Results (from the past 240 hour(s))  Resp Panel by RT-PCR (Flu A&B, Covid) Nasopharyngeal Swab     Status: None   Collection Time: 04/16/21  5:20 PM   Specimen: Nasopharyngeal Swab; Nasopharyngeal(NP) swabs in vial transport medium  Result Value Ref Range Status   SARS Coronavirus 2 by RT PCR NEGATIVE NEGATIVE Final    Comment: (NOTE) SARS-CoV-2 target nucleic acids are NOT DETECTED.  The SARS-CoV-2 RNA is generally detectable in upper respiratory specimens during the acute phase of infection. The lowest concentration of SARS-CoV-2 viral copies this assay can detect is 138 copies/mL. A negative result does not preclude SARS-Cov-2 infection and should not be used as the sole basis for treatment or other patient management decisions. A negative result may occur with  improper specimen collection/handling, submission of specimen other than nasopharyngeal swab, presence of viral mutation(s) within the areas targeted by this assay, and inadequate number of viral copies(<138 copies/mL). A negative result must be combined with clinical observations, patient history, and epidemiological information. The expected result is Negative.  Fact Sheet for Patients:  EntrepreneurPulse.com.au  Fact Sheet for Healthcare Providers:  IncredibleEmployment.be  This test is no t yet approved or cleared by the  [place] FDA and  has been authorized for detection and/or diagnosis of SARS-CoV-2 by FDA under an Emergency Use Authorization (EUA). This EUA will remain  in effect (meaning this test can be used) for the duration of the COVID-19 declaration under Section 564(b)(1) of the Act, 21 U.S.C.section 360bbb-3(b)(1), unless the authorization is terminated  or revoked sooner.       Influenza A by PCR NEGATIVE NEGATIVE Final   Influenza B by PCR NEGATIVE NEGATIVE Final    Comment: (NOTE) The Xpert Xpress SARS-CoV-2/FLU/RSV plus assay is intended as an aid in the diagnosis of influenza from Nasopharyngeal swab specimens and should not be used as a sole basis for treatment. Nasal washings and aspirates are unacceptable for Xpert Xpress SARS-CoV-2/FLU/RSV testing.  Fact Sheet for Patients: EntrepreneurPulse.com.au  Fact Sheet for Healthcare Providers: IncredibleEmployment.be  This test is not yet approved or cleared by the [place] FDA and has been authorized for detection and/or diagnosis of SARS-CoV-2 by FDA under an Emergency Use Authorization (EUA). This EUA will remain in effect (meaning this test can be used) for the duration of the COVID-19 declaration under Section 564(b)(1) of the Act, 21 U.S.C. section 360bbb-3(b)(1), unless the authorization is terminated or revoked.  Performed at [facility], [place] [address] [place], [place] [address]     Radiology Studies: No results found.  Scheduled Meds:  (feeding supplement) PROSource Plus  30 mL Oral BID BM   furosemide  40 mg Intravenous TID   metoprolol tartrate  50 mg Oral BID   potassium chloride  20 mEq Oral TID   Ensure Max Protein  11 oz Oral Daily   sodium chloride flush  3 mL Intravenous Q12H   Continuous Infusions:  sodium chloride       LOS: 3 days    Time spent: 50 min    Taunja Brickner, MD [facility]   If 7PM-7AM, please contact  night-coverage

## 2021-04-20 NOTE — NC FL2 (Signed)
[place] MEDICAID FL2 LEVEL OF CARE SCREENING TOOL     IDENTIFICATION  Patient Name: Amber Herring Birthdate: 1960-12-07 Sex: female Admission Date (Current Location): 04/16/2021  [facility] and [place] Number:  Herbalist and Address:  [facility],  [place], [place]      Provider Number: [id number]  Attending Physician Name and Address:  Shawna Clamp, MD  Relative Name and Phone Number:  Zonia Kief (Daughter)   [phone]    Current Level of Care: Hospital Recommended Level of Care: [place] Prior Approval Number:    Date Approved/Denied:   PASRR Number: [id number]  Discharge Plan: SNF    Current Diagnoses: Patient Active Problem List   Diagnosis Date Noted   Atrial flutter ([place]) 04/17/2021   CAP (community acquired pneumonia) 04/17/2021   Pressure injury of skin 04/17/2021   Anasarca 04/16/2021   Cardiomegaly 04/16/2021   Chest pain 04/16/2021   Symptomatic anemia 01/25/2021   Morbid obesity ([place]) 01/25/2021   Vaginal bleeding 01/25/2021   Iron deficiency anemia due to chronic blood loss 01/25/2021   Hypokalemia 01/25/2021   Facial swelling 11/17/2006   Headache(784.0) 05/19/2005   Stress at work 11/17/2003   Lower urinary tract infectious disease 10/16/1997   Knee pain 06/16/1996   Sinusitis 02/17/1996   Back pain 10/16/1992    Orientation RESPIRATION BLADDER Height & Weight     Self, Time, Situation, Place  Normal External catheter Weight: (!) 158.8 kg Height:  _0  (175.3 cm)  BEHAVIORAL SYMPTOMS/MOOD NEUROLOGICAL BOWEL NUTRITION STATUS      Continent Diet (see d/c summary)  AMBULATORY STATUS COMMUNICATION OF NEEDS Skin   Extensive Assist Verbally Other (Comment) (Pressure injury L thigh, upper, posterior)                       Personal Care Assistance Level of Assistance  Bathing, Feeding, Dressing Bathing Assistance: Maximum assistance Feeding assistance:  Independent Dressing Assistance: Maximum assistance     Functional Limitations Info  Sight, Hearing, Speech Sight Info: Adequate Hearing Info: Adequate Speech Info: Adequate    SPECIAL CARE FACTORS FREQUENCY  PT (By licensed PT), OT (By licensed OT)     PT Frequency: 5x/w OT Frequency: 5X/W            Contractures Contractures Info: Not present    Additional Factors Info  Code Status, Allergies Code Status Info: full Allergies Info: NKA           Current Medications (04/20/2021):  This is the current hospital active medication list Current Facility-Administered Medications  Medication Dose Route Frequency Provider Last Rate Last Admin   (feeding supplement) PROSource Plus liquid 30 mL  30 mL Oral BID BM Cherene Altes, MD   30 mL at 04/20/21 1513   0.9 %  sodium chloride infusion  250 mL Intravenous PRN Toy Baker, MD       acetaminophen (TYLENOL) tablet 650 mg  650 mg Oral Q6H PRN Toy Baker, MD       amiodarone (PACERONE) tablet 400 mg  400 mg Oral BID Dunn, Dayna N, PA-C   400 mg at 04/20/21 1513   ferrous sulfate tablet 325 mg  325 mg Oral TID WC Dunn, Dayna N, PA-C       furosemide (LASIX) injection 40 mg  40 mg Intravenous BID Dunn, Dayna N, PA-C       heparin injection 5,000 Units  5,000 Units Subcutaneous Q8H Dunn, Nedra Hai, PA-C  5,000 Units at 04/20/21 1513   megestrol (MEGACE) tablet 40 mg  40 mg Oral BID Radene Gunning, MD   40 mg at 04/20/21 1513   metoprolol tartrate (LOPRESSOR) tablet 50 mg  50 mg Oral BID Cherene Altes, MD   50 mg at 04/20/21 0623   oxyCODONE (Oxy IR/ROXICODONE) immediate release tablet 5 mg  5 mg Oral Q4H PRN Cherene Altes, MD   5 mg at 04/18/21 2124   potassium chloride SA (KLOR-CON M) CR tablet 20 mEq  20 mEq Oral TID Cherene Altes, MD   20 mEq at 04/20/21 1513   protein supplement (ENSURE MAX) liquid  11 oz Oral Daily Joette Catching T, MD   11 oz at 04/20/21 0921   sodium chloride flush (NS) 0.9 %  injection 3 mL  3 mL Intravenous Q12H Doutova, Anastassia, MD   3 mL at 04/20/21 0921   sodium chloride flush (NS) 0.9 % injection 3 mL  3 mL Intravenous PRN Toy Baker, MD       traMADol (ULTRAM) tablet 50 mg  50 mg Oral Q6H PRN Cherene Altes, MD   50 mg at 04/19/21 2100     Discharge Medications: Please see discharge summary for a list of discharge medications.  Relevant Imaging Results:  Relevant Lab Results:   Additional Information [place], [place]

## 2021-04-20 NOTE — Progress Notes (Addendum)
Progress Note  Patient Name: Amber Herring Date of Encounter: 04/20/2021  Primary Cardiologist: Pixie Casino, MD  Subjective   Feeling a little woozy this morning. Continues to diurese significantly. Still edematous. No acute SOB. No CP. Not on telemetry.  Inpatient Medications    Scheduled Meds:  (feeding supplement) PROSource Plus  30 mL Oral BID BM   furosemide  40 mg Intravenous TID   metoprolol tartrate  50 mg Oral BID   potassium chloride  20 mEq Oral TID   Ensure Max Protein  11 oz Oral Daily   sodium chloride flush  3 mL Intravenous Q12H   Continuous Infusions:  sodium chloride     PRN Meds: sodium chloride, acetaminophen **OR** [DISCONTINUED] acetaminophen, oxyCODONE, sodium chloride flush, traMADol   Vital Signs    Vitals:   04/19/21 0532 04/19/21 1900 04/20/21 0344 04/20/21 0926  BP: 111/72 111/74 93/63 104/66  Pulse: (!) 106 100 100 (!) 105  Resp: _0 Temp: 98.7 F (37.1 C) 97.7 F (36.5 C) 98.7 F (37.1 C)   TempSrc: Oral Oral Oral   SpO2: 92% 99% 91%   Weight:      Height:        Intake/Output Summary (Last 24 hours) at 04/20/2021 1209 Last data filed at 04/20/2021 0128 Gross per 24 hour  Intake 944 ml  Output 6450 ml  Net -5506 ml   Last 3 Weights 04/16/2021 02/02/2021 01/25/2021  Weight (lbs) 350 lb 360 lb 360 lb  Weight (kg) 158.759 kg 163.295 kg 163.295 kg     Telemetry    N/a - Personally Reviewed  ECG    No new tracings - Personally Reviewed  Physical Exam   GEN: No acute distress, morbidly obese HEENT: Normocephalic, atraumatic, sclera non-icteric. Neck: No JVD or bruits. Cardiac: Reg rhythm tachycardic no murmurs, rubs, or gallops.  Respiratory: Mildly diminished BS throughout. Breathing is unlabored. GI: Soft, nontender, non-distended, BS +x 4. MS: no deformity. Extremities: No clubbing or cyanosis. Marked LE edema superimposed on large baseline leg habitus Neuro:  AAOx3. Follows commands. Psych:  Responds to  questions appropriately with a normal affect.  Labs    High Sensitivity Troponin:   Recent Labs  Lab 04/16/21 1721 04/16/21 1827  TROPONINIHS 13 12      Cardiac EnzymesNo results for input(s): TROPONINI in the last 168 hours. No results for input(s): TROPIPOC in the last 168 hours.   Chemistry Recent Labs  Lab 04/16/21 1721 04/17/21 0525 04/18/21 0553 04/19/21 0531 04/20/21 0433  NA 139 140 140 138 136  K 3.7 3.7 3.6 3.4* 4.1  CL 105 107 102 99 98  CO2 _1 32 34*  GLUCOSE 79 96 99 88 90  BUN _2 CREATININE 0.78 0.71 0.79 0.78 0.81  CALCIUM 8.7* 8.6* 9.0 9.0 9.0  PROT 7.5 7.0 7.3  --   --   ALBUMIN 3.5 3.3* 3.2*  --   --   AST _3 --   --   ALT _4 --   --   ALKPHOS 87 77 77  --   --   BILITOT 1.8* 1.3* 1.2  --   --   GFRNONAA >60 >60 >60 >60 >60  ANIONGAP _5 4*     Hematology Recent Labs  Lab 04/17/21 0525 04/18/21 0553 04/19/21 0531  WBC 4.7 5.4 4.8  RBC 4.62   4.62 4.67 4.71  HGB 10.4* 10.4* 10.5*  HCT 35.7* 36.3 35.9*  MCV 77.3* 77.7* 76.2*  MCH 22.5* 22.3* 22.3*  MCHC 29.1* 28.7* 29.2*  RDW 21.4* 21.3* 20.9*  PLT 323 323 332    BNP Recent Labs  Lab 04/16/21 1722  BNP 322.0*     DDimer  Recent Labs  Lab 04/17/21 0220  DDIMER 1.62*     Radiology    No results found.  Cardiac Studies   2D Echo 04/17/21  1. Left ventricular ejection fraction, by estimation, is 45 to 50%. Left  ventricular ejection fraction by 2D MOD biplane is 46.2 %. The left  ventricle has mildly decreased function. The left ventricle demonstrates  global hypokinesis. Indeterminate  diastolic filling due to E-A fusion.   2. Right ventricular systolic function is normal. The right ventricular  size is moderately enlarged. There is mildly elevated pulmonary artery  systolic pressure. The estimated right ventricular systolic pressure is  97.5 mmHg.   3. Right atrial size was severely dilated.   4. The mitral valve is abnormal.  Mild mitral valve regurgitation.   5. Tricuspid valve regurgitation is mild to moderate.   6. The aortic valve is tricuspid. Aortic valve regurgitation is not  visualized.   7. The inferior vena cava is dilated in size with <50% respiratory  variability, suggesting right atrial pressure of 15 mmHg.   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 She was initially tachycardic felt due to sinus tach. Found to have CHF with EF 45-50% without prior to compare to. Also experiencing homelessness. Does not plan to return to truck driving due to ambulation trouble with knees.  Assessment & Plan    1. Acute combined CHF with anasarca - currently on IV Lasix 60m TID, Lopressor 550mBID, KCl - continues to produce substantial diuresis with total UOP yesterday of 6.4L bringing net to 18.4L - has not been weighed since 12/30, will request daily weights - BP appears too soft for additional GDMT - she has also felt woozy this AM so question whether we pull back on Lasix to 4056mID - echo showed new LV dysfunction EF 45-50% with global HK and moderately enlarged RV with mildly elevated PASP, mild MR, mild-moderate TR. - query need for ischemic eval at some point given etiology of cardiomyopathy unclear at present time  2. Tachycardia felt to be sinus tach versus ectopic atrial tachycardia - Dr. HilDebara Pickettlt this did not represent atrial flutter, but instead more likely atrial tachycardia - dimer was 1.62 on arrival - ?obtain CTA to exclude PE given persistent tachycardia - LE duplex 12/31 negative for DVT - TSH wnl - will review EKG with Dr. CreStanford Breedd place back on telemetry  3. Mild MR, mild-moderate TR - follow clinically, anticipate later reassessment on OP echocardiogram   4. Vaginal bleeding - limited pelvic US [place]  admit without acute findings, but was not able to visualize all structures/endometrium - IM recommends OP evaluation  5. Severe morbid obesity - would be very concerned for underlying OSA, needs OP sleep study arranged at f/u - has seen dietitian consult  6. Anemia, microcytic, with hx of iron deficiency - ferritin only 8, iron 19 - per primary team  7. Hypokalemia/hypomagnesemia - improved by last check  ? No DVT ppx on board. Will review with MD.  For questions or updates, please contact [place] Please consult [email or web address] for contact info under Cardiology/STEMI.  Signed, Charlie Pitter, PA-C 04/20/2021, 12:09 PM   As above, patient seen and examined.  Her dyspnea is improving but she remains markedly volume overloaded.  She does have some dizziness.  We will change Lasix to 40 mg IV twice daily.  Continue to follow renal function.  We will not add Farxiga due to obesity/risk of fungal infection.  She remains in atrial flutter versus atrial tachycardia.  I think this is likely contributing to her worsening CHF.  We will continue metoprolol.  Blood pressure is borderline and heart rate remains elevated.  We will add amiodarone 400 mg twice daily for 1 week then 200 mg daily thereafter.  We will resume telemetry.  I would like to proceed with cardioversion but she is not anticoagulated and I think this would be unsafe in the setting of her recent anemia/vaginal bleeding.  I would ask the gynecologist to examine the patient while here to see if this can be addressed.  Once this improves could begin apixaban and proceed with TEE guided cardioversion.  Likely needs to see electrophysiology as an outpatient for consideration of antiarrhythmic therapy versus ablation.  Would also add iron for microcytic anemia.  Question if anemia could be contributing to CHF due to high output.  Finally her right heart is dilated on echocardiogram.  Likely secondary to obesity hypoventilation syndrome and sleep  apnea.  Will need outpatient evaluation. Kirk Ruths, MD

## 2021-04-20 NOTE — Progress Notes (Signed)
Physical Therapy Treatment Patient Details Name: Amber Herring MRN: [id number] DOB: [birth year] Today's Date: 04/20/2021   History of Present Illness Patient presents to the ER 04/16/21 with chief complaint of shortness of breath and swelling of her lower abdomen and bilateral lower extremities, anasarca. PMH: recent left hand fx-3 months ago), GERD, obesity, metabolic syndrome, anxiety.    PT Comments    General Comments: AxO x 3 motivated and willing but limited by B knee pain and body habitus Pt required + 2 assist to get OOB to recliner and was unable to amb due to B knee pain (Arthritis) Pt will need ST Rehab at SNF to address her decline in mobility.   Recommendations for follow up therapy are one component of a multi-disciplinary discharge planning process, led by the attending physician.  Recommendations may be updated based on patient status, additional functional criteria and insurance authorization.  Follow Up Recommendations  Skilled nursing-short term rehab (<3 hours/day)     Assistance Recommended at Discharge Frequent or constant Supervision/Assistance  Patient can return home with the following     Equipment Recommendations       Recommendations for Other Services       Precautions / Restrictions Precautions Precautions: Fall Precaution Comments: B knee Arthritis and L hand splint from Fx approx a month ago (fall) Splint/Cast: has a splint for Left hand - for heavier ADL and hand use.     Mobility  Bed Mobility Overal bed mobility: Needs Assistance Bed Mobility: Rolling;Supine to Sit;Sit to Supine Rolling: Mod assist;+2 for safety/equipment   Supine to sit: Mod assist;+2 for physical assistance Sit to supine: Max assist;+2 for physical assistance   General bed mobility comments: Able to roll left and right with use of bed rails - assistance for LEs. Mod x 2 to transfer to side of bed with patient using bed rails and assisting with upper body. She requires  the most assistance for severely edematous LEs.    Transfers Overall transfer level: Needs assistance Equipment used: Rolling walker (2 wheels) Judie Petit Environmental consultant) Transfers: Sit to/from Stand Sit to Stand: Min guard;+2 safety/equipment           General transfer comment: pt was able to rise x 3 attempts and forward weightshift momentum.  B knee pain increased to 10/10.  Pt was only able to take a few side steps from bed to recliner with excessive lean on walker and forward flex posture.    Ambulation/Gait               General Gait Details: transfer only due to B knee pain and effort   Stairs             Wheelchair Mobility    Modified Rankin (Stroke Patients Only)       Balance                                            Cognition Arousal/Alertness: Awake/alert Behavior During Therapy: WFL for tasks assessed/performed Overall Cognitive Status: Within Functional Limits for tasks assessed                                 General Comments: AxO x 3 motivated and willing but limited by B knee pain and body habitus        Exercises  General Comments        Pertinent Vitals/Pain Pain Score: 10-Worst pain ever Pain Location: B knees (Arthritis) Pain Descriptors / Indicators: Grimacing;Aching Pain Intervention(s): Monitored during session;Repositioned (secured chat MD)    Home Living                          Prior Function            PT Goals (current goals can now be found in the care plan section) Progress towards PT goals: Progressing toward goals    Frequency    Min 2X/week      PT Plan Current plan remains appropriate    Co-evaluation              AM-PAC PT "6 Clicks" Mobility   Outcome Measure  Help needed turning from your back to your side while in a flat bed without using bedrails?: A Lot Help needed moving from lying on your back to sitting on the side of a flat bed without  using bedrails?: A Lot Help needed moving to and from a bed to a chair (including a wheelchair)?: A Lot Help needed standing up from a chair using your arms (e.g., wheelchair or bedside chair)?: A Lot Help needed to walk in hospital room?: Total Help needed climbing 3-5 steps with a railing? : Total 6 Click Score: 10    End of Session Equipment Utilized During Treatment: Gait belt Activity Tolerance: Patient limited by pain Patient left: in chair;with call bell/phone within reach Nurse Communication: Mobility status;Need for lift equipment PT Visit Diagnosis: Unsteadiness on feet (R26.81);Difficulty in walking, not elsewhere classified (R26.2);Pain Pain - part of body: Knee     Time: 0925-1000 PT Time Calculation (min) (ACUTE ONLY): 35 min  Charges:  $Gait Training: 8-22 mins $Therapeutic Activity: 8-22 mins                     {Cyerra Yim  PTA Acute  Rehabilitation Services Pager      [phone] Office      [phone]

## 2021-04-21 LAB — CBC
HCT: 37.7 % (ref 36.0–46.0)
Hemoglobin: 10.9 g/dL — ABNORMAL LOW (ref 12.0–15.0)
MCH: 22.3 pg — ABNORMAL LOW (ref 26.0–34.0)
MCHC: 28.9 g/dL — ABNORMAL LOW (ref 30.0–36.0)
MCV: 77.3 fL — ABNORMAL LOW (ref 80.0–100.0)
Platelets: 345 10*3/uL (ref 150–400)
RBC: 4.88 MIL/uL (ref 3.87–5.11)
RDW: 21.2 % — ABNORMAL HIGH (ref 11.5–15.5)
WBC: 5.7 10*3/uL (ref 4.0–10.5)
nRBC: 0.4 % — ABNORMAL HIGH (ref 0.0–0.2)

## 2021-04-21 LAB — BASIC METABOLIC PANEL
Anion gap: 7 (ref 5–15)
BUN: 18 mg/dL (ref 6–20)
CO2: 32 mmol/L (ref 22–32)
Calcium: 9.1 mg/dL (ref 8.9–10.3)
Chloride: 97 mmol/L — ABNORMAL LOW (ref 98–111)
Creatinine, Ser: 0.78 mg/dL (ref 0.44–1.00)
GFR, Estimated: >60 mL/min (ref >60)
Glucose, Bld: 90 mg/dL (ref 70–99)
Potassium: 4.5 mmol/L (ref 3.5–5.1)
Sodium: 136 mmol/L (ref 135–145)

## 2021-04-21 NOTE — Progress Notes (Signed)
Progress Note  Patient Name: Amber Herring Date of Encounter: 04/21/2021  [facility] Cardiologist: Pixie Casino, MD   Subjective   Mild dyspnea; some chest heaviness increase with inspiration and lying flat.  Inpatient Medications    Scheduled Meds:  (feeding supplement) PROSource Plus  30 mL Oral BID BM   amiodarone  400 mg Oral BID   ferrous sulfate  325 mg Oral TID WC   furosemide  40 mg Intravenous BID   heparin injection (subcutaneous)  5,000 Units Subcutaneous Q8H   megestrol  40 mg Oral BID   metoprolol tartrate  50 mg Oral BID   potassium chloride  20 mEq Oral TID   Ensure Max Protein  11 oz Oral Daily   sodium chloride flush  3 mL Intravenous Q12H   Continuous Infusions:  sodium chloride     PRN Meds: sodium chloride, acetaminophen **OR** [DISCONTINUED] acetaminophen, oxyCODONE, sodium chloride flush, traMADol   Vital Signs    Vitals:   04/20/21 2035 04/20/21 2238 04/20/21 2240 04/21/21 0520  BP: 103/66 93/64 106/75 103/65  Pulse:      Resp: _0 Temp:    97.9 F (36.6 C)  TempSrc:    Oral  SpO2:  94% 95% 96%  Weight:    (!) 160.3 kg  Height:        Intake/Output Summary (Last 24 hours) at 04/21/2021 0851 Last data filed at 04/21/2021 0136 Gross per 24 hour  Intake 240 ml  Output 3050 ml  Net -2810 ml   Last 3 Weights 04/21/2021 04/16/2021 02/02/2021  Weight (lbs) 353 lb 6.4 oz 350 lb 360 lb  Weight (kg) 160.3 kg 158.759 kg 163.295 kg      Telemetry    Atrial flutter rate controlled - Personally Reviewed    Physical Exam   GEN: No acute distress. Obese  Neck: No JVD Cardiac: irregular Respiratory: Clear to auscultation bilaterally. GI: Soft, nontender, non-distended  MS: 1+ edema Neuro:  Nonfocal  Psych: Normal affect   Labs    High Sensitivity Troponin:   Recent Labs  Lab 04/16/21 1721 04/16/21 1827  TROPONINIHS 13 12     Chemistry Recent Labs  Lab 04/16/21 1721 04/16/21 1721 04/17/21 0525 04/18/21 0553  04/19/21 0531 04/20/21 0433 04/21/21 0505  NA 139  --  140 140 138 136 136  K 3.7  --  3.7 3.6 3.4* 4.1 4.5  CL 105  --  107 102 99 98 97*  CO2 27  --  27 29 32 34* 32  GLUCOSE 79  --  96 99 88 90 90  BUN 14  --  _1 CREATININE 0.78  --  0.71 0.79 0.78 0.81 0.78  CALCIUM 8.7*  --  8.6* 9.0 9.0 9.0 9.1  MG  --    < > 1.8 1.8 1.8 2.0  --   PROT 7.5  --  7.0 7.3  --   --   --   ALBUMIN 3.5  --  3.3* 3.2*  --   --   --   AST 29  --  24 23  --   --   --   ALT 19  --  16 16  --   --   --   ALKPHOS 87  --  77 77  --   --   --   BILITOT 1.8*  --  1.3* 1.2  --   --   --  GFRNONAA >60  --  >60 >60 >60 >60 >60  ANIONGAP 7  --  _0 4* 7   < > = values in this interval not displayed.     Hematology Recent Labs  Lab 04/18/21 0553 04/19/21 0531 04/21/21 0505  WBC 5.4 4.8 5.7  RBC 4.67 4.71 4.88  HGB 10.4* 10.5* 10.9*  HCT 36.3 35.9* 37.7  MCV 77.7* 76.2* 77.3*  MCH 22.3* 22.3* 22.3*  MCHC 28.7* 29.2* 28.9*  RDW 21.3* 20.9* 21.2*  PLT 323 332 345   Thyroid  Recent Labs  Lab 04/17/21 0525  TSH 2.511    BNP Recent Labs  Lab 04/16/21 1722  BNP 322.0*    DDimer  Recent Labs  Lab 04/17/21 0220  DDIMER 1.62*     Cardiac Studies   Echo 04/17/21   1. Left ventricular ejection fraction, by estimation, is 45 to 50%. Left  ventricular ejection fraction by 2D MOD biplane is 46.2 %. The left  ventricle has mildly decreased function. The left ventricle demonstrates  global hypokinesis. Indeterminate  diastolic filling due to E-A fusion.   2. Right ventricular systolic function is normal. The right ventricular  size is moderately enlarged. There is mildly elevated pulmonary artery  systolic pressure. The estimated right ventricular systolic pressure is  69.8 mmHg.   3. Right atrial size was severely dilated.   4. The mitral valve is abnormal. Mild mitral valve regurgitation.   5. Tricuspid valve regurgitation is mild to moderate.   6. The aortic valve is  tricuspid. Aortic valve regurgitation is not  visualized.   7. The inferior vena cava is dilated in size with <50% respiratory  variability, suggesting right atrial pressure of 15 mmHg.   Comparison(s): No prior Echocardiogram.   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 She was initially tachycardic felt due to sinus tach. Found to have CHF with EF 45-50% without prior to compare to.   Assessment & Plan    1 acute on chronic combined systolic/diastolic congestive heart failure-volume status is improving but she remains overloaded.  We will continue Lasix at present dose.  Follow renal function.  Will not add Farxiga due to obesity/risk of fungal infection.  2 atrial flutter-electrocardiogram shows atrial flutter versus ectopic atrial tachycardia.  This is likely contributing to congestive heart failure.  We will continue metoprolol and amiodarone for rate control.  She has had difficulties with vaginal bleeding.  Gynecology is scheduled to see patient for consultation today.  Once this issue is addressed would like to begin apixaban followed by TEE guided cardioversion.  Hopefully reestablishing sinus rhythm would improve congestive heart failure.  Following discharge would like for her to be evaluated by electrophysiology for consideration of ablation.  3 morbid obesity-we will need evaluation for obstructive sleep apnea as an outpatient.  She is noted to have right atrial enlargement/tricuspid regurgitation on her echocardiogram likely related to pulmonary hypertension from sleep apnea and obesity hypoventilation syndrome.  4 vaginal bleeding-she is scheduled for gynecology evaluation today.  We will begin apixaban once this issue is resolved.  5 microcytic anemia-likely secondary to vaginal bleeding.  Per  primary service.  For questions or updates, please contact [place] Please consult [email or web address] for contact info under        Signed, Kirk Ruths, MD  04/21/2021, 8:51  AM

## 2021-04-21 NOTE — Progress Notes (Signed)
PROGRESS NOTE    Amber Herring  EGB:[id number] DOB: [birth year] DOA: 04/16/2021 PCP: Denita Lung, MD   Brief Narrative:  This 60 yrs old Morbidly obese female with PMH significant for depression, chronic anemia, GERD, and HLD who presented to the ED with c/o: generalized swelling primarily of the legs and abdomen.  ROS was also positive for intermittent dysfunctional vaginal bleeding.  Patient is admitted for acute systolic CHF requiring IV diuresis.  Hospital course complicated by atrial fibrillation requiring amiodarone, Cardiology is consulted, GYN is consulted for  vaginal bleeding. Plan is to start Eliquis once dysfunctional uterine bleeding improves.  Assessment & Plan:   Principal Problem:   Anasarca Active Problems:   Lower urinary tract infectious disease   Morbid obesity ([place])   Vaginal bleeding   Iron deficiency anemia due to chronic blood loss   Cardiomegaly   Chest pain   Atrial flutter ([place])   CAP (community acquired pneumonia)   Pressure injury of skin  Acute systolic CHF /Anasarca: Patient presented with generalized body swelling more pronounced in the legs and abdomen.   Echocardiogram shows LVEF 45 to 50% with global hypokinesis and mildly dilated PASP.  Cardiology consulted. Continue aggressive diuresis, Net negative balance 13 L since admission. Continue daily weight, intake output charting. Continue IV Lasix 40 mg every 8 hours. Venous duplex negative for DVT.   Intake/Output Summary (Last 24 hours) at 04/21/2021 1339 Last data filed at 04/21/2021 1229 Gross per 24 hour  Intake 240 ml  Output 4250 ml  Net -4010 ml     Atrial Flutter /Ectopic atrial tachycardia EKG showed atrial flutter versus ectopic atrial tachycardia. This is contributing to the CHF exacerbation. Cardiology recommended to continue metoprolol and amiodarone for rate control. Patient will eventually need anticoagulation, needs clearance from GYN for vaginal  bleeding.  Hypokalemia: Replaced and resolved.  Hypomagnesemia: Replaced and resolved.  Morbid obesity: Diet and exercise discussed in detail  Vaginal bleeding: H&H remained stable. Pelvic ultrasound without acute findings, not able to visualize all structures. No evidence of ongoing bleeding presently. GYN consulted,  Patient will be seen today.  Iron deficiency anemia: Patient has received IV iron therapy. H / H stable.  UTI: No clear signs and symptoms to suggest  UTI. Continue to monitor off antibiotics.  Pneumonia ruled out.: Normal procalcitonin, normal WBCs, Antibiotic discontinued.   DVT prophylaxis: SCDs Code Status: Full code. Family Communication:  No family at bed side. Disposition Plan:   Admitted for generalized anasarca /  CHF exacerbation requiring IV diuresis. Hospital course complicated by atrial fibrillation, continuous dysfunctional uterine bleeding.   Consultants:  Cardiology  Procedures: Echocardiogram. Antimicrobials:   Anti-infectives (From admission, onward)    Start     Dose/Rate Route Frequency Ordered Stop   04/17/21 2100  cefTRIAXone (ROCEPHIN) 1 g in sodium chloride 0.9 % 100 mL IVPB  Status:  Discontinued        1 g 200 mL/hr over 30 Minutes Intravenous Every 24 hours 04/16/21 2129 04/17/21 1821   04/17/21 0145  azithromycin (ZITHROMAX) 500 mg in sodium chloride 0.9 % 250 mL IVPB  Status:  Discontinued        500 mg 250 mL/hr over 60 Minutes Intravenous Every 24 hours 04/17/21 0132 04/17/21 1821   04/16/21 2045  cefTRIAXone (ROCEPHIN) 1 g in sodium chloride 0.9 % 100 mL IVPB        1 g 200 mL/hr over 30 Minutes Intravenous  Once 04/16/21 2042 04/16/21 2130  Subjective: Patient was seen and examined at bedside.  Overnight events noted.   Patient reports feeling much improved.  She still appears volume overloaded. Patient denies any chest pain but reports having chest heaviness while lying flat.  Objective: Vitals:    04/20/21 2035 04/20/21 2238 04/20/21 2240 04/21/21 0520  BP: 103/66 93/64 106/75 103/65  Pulse:      Resp: _0 Temp:    97.9 F (36.6 C)  TempSrc:    Oral  SpO2:  94% 95% 96%  Weight:    (!) 160.3 kg  Height:        Intake/Output Summary (Last 24 hours) at 04/21/2021 1339 Last data filed at 04/21/2021 1229 Gross per 24 hour  Intake 240 ml  Output 4250 ml  Net -4010 ml   Filed Weights   04/16/21 1509 04/21/21 0520  Weight: (!) 158.8 kg (!) 160.3 kg    Examination:  General exam: Morbidly obese, appears comfortable, not in any acute distress. Respiratory system: Decreased breath sounds bilaterally, no wheezing, no crackles. Cardiovascular system: S1-S2 heard, Irregular rhythm , no murmur. Gastrointestinal system: Abdomen is soft, mildly distended, nontender, BS+ Central nervous system: Alert and oriented x 3 . No focal neurological deficits. Extremities: Bilateral pedal edema+, no cyanosis, no clubbing. Skin: No rashes, lesions or ulcers. Psychiatry: Judgement and insight appear normal. Mood & affect appropriate.     Data Reviewed: I have personally reviewed following labs and imaging studies  CBC: Recent Labs  Lab 04/16/21 1721 04/17/21 0525 04/18/21 0553 04/19/21 0531 04/21/21 0505  WBC 5.1 4.7 5.4 4.8 5.7  NEUTROABS 3.0 2.7  --   --   --   HGB 10.9* 10.4* 10.4* 10.5* 10.9*  HCT 37.5 35.7* 36.3 35.9* 37.7  MCV 77.2* 77.3* 77.7* 76.2* 77.3*  PLT 315 323 323 332 824   Basic Metabolic Panel: Recent Labs  Lab 04/17/21 0525 04/18/21 0553 04/19/21 0531 04/20/21 0433 04/21/21 0505  NA 140 140 138 136 136  K 3.7 3.6 3.4* 4.1 4.5  CL 107 102 99 98 97*  CO2 27 29 32 34* 32  GLUCOSE 96 99 88 90 90  BUN _1 CREATININE 0.71 0.79 0.78 0.81 0.78  CALCIUM 8.6* 9.0 9.0 9.0 9.1  MG 1.8 1.8 1.8 2.0  --   PHOS 4.2  --   --   --   --    GFR: Estimated Creatinine Clearance: 125.6 mL/min (by C-G formula based on SCr of 0.78 mg/dL). Liver Function  Tests: Recent Labs  Lab 04/16/21 1721 04/17/21 0525 04/18/21 0553  AST _2 ALT _3 ALKPHOS 87 77 77  BILITOT 1.8* 1.3* 1.2  PROT 7.5 7.0 7.3  ALBUMIN 3.5 3.3* 3.2*   No results for input(s): LIPASE, AMYLASE in the last 168 hours. No results for input(s): AMMONIA in the last 168 hours. Coagulation Profile: No results for input(s): INR, PROTIME in the last 168 hours. Cardiac Enzymes: No results for input(s): CKTOTAL, CKMB, CKMBINDEX, TROPONINI in the last 168 hours. BNP (last 3 results) No results for input(s): PROBNP in the last 8760 hours. HbA1C: No results for input(s): HGBA1C in the last 72 hours. CBG: No results for input(s): GLUCAP in the last 168 hours. Lipid Profile: No results for input(s): CHOL, HDL, LDLCALC, TRIG, CHOLHDL, LDLDIRECT in the last 72 hours. Thyroid Function Tests: No results for input(s): TSH, T4TOTAL, FREET4, T3FREE, THYROIDAB in the last 72 hours. Anemia Panel: No  results for input(s): VITAMINB12, FOLATE, FERRITIN, TIBC, IRON, RETICCTPCT in the last 72 hours. Sepsis Labs: Recent Labs  Lab 04/17/21 0525 04/18/21 0553  PROCALCITON <0.10 <0.10    Recent Results (from the past 240 hour(s))  Resp Panel by RT-PCR (Flu A&B, Covid) Nasopharyngeal Swab     Status: None   Collection Time: 04/16/21  5:20 PM   Specimen: Nasopharyngeal Swab; Nasopharyngeal(NP) swabs in vial transport medium  Result Value Ref Range Status   SARS Coronavirus 2 by RT PCR NEGATIVE NEGATIVE Final    Comment: (NOTE) SARS-CoV-2 target nucleic acids are NOT DETECTED.  The SARS-CoV-2 RNA is generally detectable in upper respiratory specimens during the acute phase of infection. The lowest concentration of SARS-CoV-2 viral copies this assay can detect is 138 copies/mL. A negative result does not preclude SARS-Cov-2 infection and should not be used as the sole basis for treatment or other patient management decisions. A negative result may occur with  improper  specimen collection/handling, submission of specimen other than nasopharyngeal swab, presence of viral mutation(s) within the areas targeted by this assay, and inadequate number of viral copies(<138 copies/mL). A negative result must be combined with clinical observations, patient history, and epidemiological information. The expected result is Negative.  Fact Sheet for Patients:  EntrepreneurPulse.com.au  Fact Sheet for Healthcare Providers:  IncredibleEmployment.be  This test is no t yet approved or cleared by the [place] FDA and  has been authorized for detection and/or diagnosis of SARS-CoV-2 by FDA under an Emergency Use Authorization (EUA). This EUA will remain  in effect (meaning this test can be used) for the duration of the COVID-19 declaration under Section 564(b)(1) of the Act, 21 U.S.C.section 360bbb-3(b)(1), unless the authorization is terminated  or revoked sooner.       Influenza A by PCR NEGATIVE NEGATIVE Final   Influenza B by PCR NEGATIVE NEGATIVE Final    Comment: (NOTE) The Xpert Xpress SARS-CoV-2/FLU/RSV plus assay is intended as an aid in the diagnosis of influenza from Nasopharyngeal swab specimens and should not be used as a sole basis for treatment. Nasal washings and aspirates are unacceptable for Xpert Xpress SARS-CoV-2/FLU/RSV testing.  Fact Sheet for Patients: EntrepreneurPulse.com.au  Fact Sheet for Healthcare Providers: IncredibleEmployment.be  This test is not yet approved or cleared by the [place] FDA and has been authorized for detection and/or diagnosis of SARS-CoV-2 by FDA under an Emergency Use Authorization (EUA). This EUA will remain in effect (meaning this test can be used) for the duration of the COVID-19 declaration under Section 564(b)(1) of the Act, 21 U.S.C. section 360bbb-3(b)(1), unless the authorization is terminated or revoked.  Performed at  [facility], [place] [address] [place], [place] [address]     Radiology Studies: No results found.  Scheduled Meds:  (feeding supplement) PROSource Plus  30 mL Oral BID BM   amiodarone  400 mg Oral BID   ferrous sulfate  325 mg Oral TID WC   furosemide  40 mg Intravenous BID   heparin injection (subcutaneous)  5,000 Units Subcutaneous Q8H   megestrol  40 mg Oral BID   metoprolol tartrate  50 mg Oral BID   potassium chloride  20 mEq Oral TID   Ensure Max Protein  11 oz Oral Daily   sodium chloride flush  3 mL Intravenous Q12H   Continuous Infusions:  sodium chloride       LOS: 4 days    Time spent: 35 min    Shawna Clamp, MD [facility]  If 7PM-7AM, please contact night-coverage

## 2021-04-21 NOTE — Progress Notes (Signed)
Occupational Therapy Treatment Patient Details Name: Amber Herring MRN: [id number] DOB: 30-Jan-1961 Today's Date: 04/21/2021   History of present illness Patient presents to the ER 04/16/21 with chief complaint of shortness of breath and swelling of her lower abdomen and bilateral lower extremities, anasarca. PMH: recent left hand fx-3 months ago), GERD, obesity, metabolic syndrome, anxiety.   OT comments  Patient was motivated to participate in session on this date. Patient was able to transfer with mod A x 2 to recliner in room. Nursing was educated on recommendations for transitioning back to bed. NT verbalized understanding. Patient completed bathing seated with set up in recliner on this date. Patient would continue to benefit from skilled OT services at this time while admitted and after d/c to address noted deficits in order to improve overall safety and independence in ADLs.     Recommendations for follow up therapy are one component of a multi-disciplinary discharge planning process, led by the attending physician.  Recommendations may be updated based on patient status, additional functional criteria and insurance authorization.    Follow Up Recommendations  Skilled nursing-short term rehab (<3 hours/day)    Assistance Recommended at Discharge Frequent or constant Supervision/Assistance  Patient can return home with the following  Two people to help with walking and/or transfers;Two people to help with bathing/dressing/bathroom;Help with stairs or ramp for entrance   Equipment Recommendations  Other (comment) (bari BSC)    Recommendations for Other Services      Precautions / Restrictions Precautions Precautions: Fall Precaution Comments: B knee Arthritis and L hand splint from Fx approx a month ago (fall) Required Braces or Orthoses: Splint/Cast Splint/Cast: has a splint for Left hand - for heavier ADL and hand use. Restrictions Weight Bearing Restrictions: No LUE Weight  Bearing: Weight bearing as tolerated Other Position/Activity Restrictions: per outpatient OT notes - use of splint with heavier tasks but otherwise is WBAT.       Mobility Bed Mobility Overal bed mobility: Needs Assistance   Rolling: Mod assist;+2 for safety/equipment   Supine to sit: Mod assist;+2 for physical assistance          Transfers                         Balance Overall balance assessment: Needs assistance Sitting-balance support: No upper extremity supported;Feet supported Sitting balance-Leahy Scale: Fair Sitting balance - Comments: patient needed two person for sitting balance on edge of bed   Standing balance support: During functional activity;Reliant on assistive device for balance Standing balance-Leahy Scale: Poor                             ADL either performed or assessed with clinical judgement   ADL Overall ADL's : Needs assistance/impaired     Grooming: Set up;Wash/dry face;Sitting Grooming Details (indicate cue type and reason): in recliner Upper Body Bathing: Set up;Sitting Upper Body Bathing Details (indicate cue type and reason): in recliner Lower Body Bathing: Moderate assistance;Sitting/lateral leans Lower Body Bathing Details (indicate cue type and reason): in recliner Upper Body Dressing : Set up Upper Body Dressing Details (indicate cue type and reason): in recliner Lower Body Dressing: Moderate assistance Lower Body Dressing Details (indicate cue type and reason): in recliner. patient does not wear socks at home             Functional mobility during ADLs: +2 for physical assistance;+2 for safety/equipment;Moderate assistance General ADL Comments:  patient was mod A x2 for transfer from edge of bed to recliner in room with increased time and patient noted to take standing rest breaks between moving BLE with resting on forearms on walker. patient was mod +x to land in chair and not plop.    Extremity/Trunk  Assessment              Vision       Perception     Praxis      Cognition Arousal/Alertness: Awake/alert Behavior During Therapy: WFL for tasks assessed/performed Overall Cognitive Status: Within Functional Limits for tasks assessed                                            Exercises     Shoulder Instructions       General Comments      Pertinent Vitals/ Pain       Faces Pain Scale: Hurts little more Pain Location: B knees (Arthritis) Pain Descriptors / Indicators: Grimacing;Aching Pain Intervention(s): Monitored during session;Repositioned  Home Living                                          Prior Functioning/Environment              Frequency  Min 2X/week        Progress Toward Goals  OT Goals(current goals can now be found in the care plan section)  Progress towards OT goals: Progressing toward goals     Plan Discharge plan remains appropriate    Co-evaluation                 AM-PAC OT "6 Clicks" Daily Activity     Outcome Measure   Help from another person eating meals?: None Help from another person taking care of personal grooming?: A Little Help from another person toileting, which includes using toliet, bedpan, or urinal?: Total Help from another person bathing (including washing, rinsing, drying)?: A Lot Help from another person to put on and taking off regular upper body clothing?: A Little Help from another person to put on and taking off regular lower body clothing?: Total 6 Click Score: 14    End of Session Equipment Utilized During Treatment: Gait belt;Other (comment) (bari walker)  OT Visit Diagnosis: Other abnormalities of gait and mobility (R26.89);Muscle weakness (generalized) (M62.81)   Activity Tolerance Patient tolerated treatment well   Patient Left in chair;with call bell/phone within reach   Nurse Communication Mobility status        Time: 2074-0979 OT Time  Calculation (min): 40 min  Charges: OT General Charges $OT Visit: 1 Visit OT Treatments $Self Care/Home Management : 38-52 mins  Jackelyn Poling OTR/L, MS Acute Rehabilitation Department Office# [phone] Pager# [phone]   Marcellina Millin 04/21/2021, 1:06 PM

## 2021-04-21 NOTE — Progress Notes (Signed)
MD ordered to hold cardiac medications due to low blood pressure 93/64. Patient is asymptomatic at this time. Will continue to monitor.

## 2021-04-21 NOTE — Consult Note (Addendum)
[facility] Attending Consult  Amber Herring is a 61 y.o. [phone] at who presented originally to the hospital with hematuria and shortness of breath. This consult is in regards to her vaginal bleeding.   Per the patient, she had menarche at age 12 and always had heavy, cramping periods. She then went through menopause when she was about 61 years old. Since that time, she has had intermittent spotting around the time when her daughter, Caryl Pina, has a cycle. She would also gets cramps. The in October she had an episode of a gush of heavy vaginal bleeding but it was very short. She then would have these episodes only on rare occasions (the most recent was several days ago). She does not bleed for long.   She was anemic in October and she states she had 3 units of blood given then.   She has not been to a gyne in many years because she was treated very poorly by the last doctor.  She denies any history of abnormal pap smears.    Past Medical History:  Diagnosis Date   Allergy    Anemia    iron defienency   Anxiety    GERD (gastroesophageal reflux disease)    Hyperlipidemia    Impaired fasting glucose    Metabolic syndrome    Obesity    Past Surgical History:  Procedure Laterality Date   COLONOSCOPY  04-2004   KNEE ARTHROSCOPY     [id number] Patient denies any other pertinent gynecologic issues.     No current facility-administered medications on file prior to encounter.   Current Outpatient Medications on File Prior to Encounter  Medication Sig Dispense Refill   APPLE CIDER VINEGAR PO Take 1 tablet by mouth every morning. gummies     EPINEPHrine (PRIMATENE MIST) 0.125 MG/ACT AERO Inhale 1 puff into the lungs daily as needed (wheezing).     ibuprofen (ADVIL) 200 MG tablet Take 400 mg by mouth every 6 (six) hours as needed for mild pain.     Multiple Vitamins-Minerals (ADULT ONE DAILY GUMMIES) CHEW Chew 2 tablets by mouth every morning.     OVER THE  COUNTER MEDICATION Take 1 capsule by mouth 2 (two) times daily. Beet root capsule     OVER THE COUNTER MEDICATION Take 2 tablets by mouth every morning. Omega XL - purchased online     OVER THE COUNTER MEDICATION Apply 1 application topically 3 (three) times daily as needed (knee pain). Otc foam for knee pain     No Known Allergies  Social History:   reports that she has quit smoking. Her smoking use included cigarettes. She has never used smokeless tobacco. She reports that she does not drink alcohol and does not use drugs. Family History  Problem Relation Age of Onset   ALS Mother     Review of Systems: Pertinent items noted in HPI and remainder of comprehensive ROS otherwise negative.  PHYSICAL EXAM: Blood pressure 103/65, pulse 100, temperature 97.9 F (36.6 C), temperature source Oral, resp. rate 20, height _0  (1.753 m), weight (!) 160.3 kg, SpO2 96 %. CONSTITUTIONAL: Well-developed, well-nourished female in no acute distress.  HENT:  Normocephalic, atraumatic, External right and left ear normal. Oropharynx is clear and moist EYES: Conjunctivae and EOM are normal. Pupils are equal, round, and reactive to light. No scleral icterus.  NECK: Normal range of motion, supple, no masses SKIN: Skin is warm and dry. No rash noted. Not diaphoretic. No erythema. No pallor.  NEUROLOGIC: Alert and oriented to person, place, and time. Normal reflexes, muscle tone coordination. No cranial nerve deficit noted. PSYCHIATRIC: Normal mood and affect. Normal behavior. Normal judgment and thought content. CARDIOVASCULAR: Normal heart rate noted RESPIRATORY: She appeared unlabored ABDOMEN: Soft, nontender, nondistended, obese PELVIC: Deferred due to limited patient mobility in setting of her chair MUSCULOSKELETAL: Normal range of motion. No tenderness.  No cyanosis, clubbing.    Labs: Results for orders placed or performed during the hospital encounter of 04/16/21 (from the past 336 hour(s))  Resp  Panel by RT-PCR (Flu A&B, Covid) Nasopharyngeal Swab   Collection Time: 04/16/21  5:20 PM   Specimen: Nasopharyngeal Swab; Nasopharyngeal(NP) swabs in vial transport medium  Result Value Ref Range   SARS Coronavirus 2 by RT PCR NEGATIVE NEGATIVE   Influenza A by PCR NEGATIVE NEGATIVE   Influenza B by PCR NEGATIVE NEGATIVE  CBC with Differential   Collection Time: 04/16/21  5:21 PM  Result Value Ref Range   WBC 5.1 4.0 - 10.5 K/uL   RBC 4.86 3.87 - 5.11 MIL/uL   Hemoglobin 10.9 (L) 12.0 - 15.0 g/dL   HCT 37.5 36.0 - 46.0 %   MCV 77.2 (L) 80.0 - 100.0 fL   MCH 22.4 (L) 26.0 - 34.0 pg   MCHC 29.1 (L) 30.0 - 36.0 g/dL   RDW 21.7 (H) 11.5 - 15.5 %   Platelets 315 150 - 400 K/uL   nRBC 0.0 0.0 - 0.2 %   Neutrophils Relative % 58 %   Neutro Abs 3.0 1.7 - 7.7 K/uL   Lymphocytes Relative 20 %   Lymphs Abs 1.0 0.7 - 4.0 K/uL   Monocytes Relative 15 %   Monocytes Absolute 0.8 0.1 - 1.0 K/uL   Eosinophils Relative 5 %   Eosinophils Absolute 0.2 0.0 - 0.5 K/uL   Basophils Relative 2 %   Basophils Absolute 0.1 0.0 - 0.1 K/uL   Immature Granulocytes 0 %   Abs Immature Granulocytes 0.01 0.00 - 0.07 K/uL   Acanthocytes PRESENT    Burr Cells PRESENT    Target Cells PRESENT   Comprehensive metabolic panel   Collection Time: 04/16/21  5:21 PM  Result Value Ref Range   Sodium 139 135 - 145 mmol/L   Potassium 3.7 3.5 - 5.1 mmol/L   Chloride 105 98 - 111 mmol/L   CO2 27 22 - 32 mmol/L   Glucose, Bld 79 70 - 99 mg/dL   BUN 14 6 - 20 mg/dL   Creatinine, Ser 0.78 0.44 - 1.00 mg/dL   Calcium 8.7 (L) 8.9 - 10.3 mg/dL   Total Protein 7.5 6.5 - 8.1 g/dL   Albumin 3.5 3.5 - 5.0 g/dL   AST 29 15 - 41 U/L   ALT 19 0 - 44 U/L   Alkaline Phosphatase 87 38 - 126 U/L   Total Bilirubin 1.8 (H) 0.3 - 1.2 mg/dL   GFR, Estimated >60 >60 mL/min   Anion gap 7 5 - 15  Troponin I (High Sensitivity)   Collection Time: 04/16/21  5:21 PM  Result Value Ref Range   Troponin I (High Sensitivity) 13 <18 ng/L   Brain natriuretic peptide   Collection Time: 04/16/21  5:22 PM  Result Value Ref Range   B Natriuretic Peptide 322.0 (H) 0.0 - 100.0 pg/mL  Troponin I (High Sensitivity)   Collection Time: 04/16/21  6:27 PM  Result Value Ref Range   Troponin I (High Sensitivity) 12 <18 ng/L  Urinalysis, Routine w reflex  microscopic Urine, Clean Catch   Collection Time: 04/16/21  7:47 PM  Result Value Ref Range   Color, Urine AMBER (A) YELLOW   APPearance CLOUDY (A) CLEAR   Specific Gravity, Urine 1.028 1.005 - 1.030   pH 5.0 5.0 - 8.0   Glucose, UA NEGATIVE NEGATIVE mg/dL   Hgb urine dipstick LARGE (A) NEGATIVE   Bilirubin Urine NEGATIVE NEGATIVE   Ketones, ur NEGATIVE NEGATIVE mg/dL   Protein, ur >=300 (A) NEGATIVE mg/dL   Nitrite POSITIVE (A) NEGATIVE   Leukocytes,Ua MODERATE (A) NEGATIVE   RBC / HPF >50 (H) 0 - 5 RBC/hpf   WBC, UA >50 (H) 0 - 5 WBC/hpf   Bacteria, UA MANY (A) NONE SEEN   Squamous Epithelial / LPF 0-5 0 - 5   Mucus PRESENT    Ca Oxalate Crys, UA PRESENT   Blood gas, venous   Collection Time: 04/16/21 11:59 PM  Result Value Ref Range   pH, Ven 7.410 7.250 - 7.430   pCO2, Ven 44.5 44.0 - 60.0 mmHg   pO2, Ven 54.4 (H) 32.0 - 45.0 mmHg   Bicarbonate 27.6 20.0 - 28.0 mmol/L   Acid-Base Excess 3.1 (H) 0.0 - 2.0 mmol/L   O2 Saturation 85.1 %   Patient temperature 98.6   D-dimer, quantitative   Collection Time: 04/17/21  2:20 AM  Result Value Ref Range   D-Dimer, Quant 1.62 (H) 0.00 - 0.50 ug/mL-FEU  Vitamin B12   Collection Time: 04/17/21  5:25 AM  Result Value Ref Range   Vitamin B-12 899 180 - 914 pg/mL  Folate   Collection Time: 04/17/21  5:25 AM  Result Value Ref Range   Folate 7.9 >5.9 ng/mL  Iron and TIBC   Collection Time: 04/17/21  5:25 AM  Result Value Ref Range   Iron 19 (L) 28 - 170 ug/dL   TIBC 394 250 - 450 ug/dL   Saturation Ratios 5 (L) 10.4 - 31.8 %   UIBC 375 ug/dL  Ferritin   Collection Time: 04/17/21  5:25 AM  Result Value Ref Range    Ferritin 8 (L) 11 - 307 ng/mL  Reticulocytes   Collection Time: 04/17/21  5:25 AM  Result Value Ref Range   Retic Ct Pct 0.8 0.4 - 3.1 %   RBC. 4.62 3.87 - 5.11 MIL/uL   Retic Count, Absolute 38.3 19.0 - 186.0 K/uL   Immature Retic Fract 24.6 (H) 2.3 - 15.9 %  Magnesium   Collection Time: 04/17/21  5:25 AM  Result Value Ref Range   Magnesium 1.8 1.7 - 2.4 mg/dL  Phosphorus   Collection Time: 04/17/21  5:25 AM  Result Value Ref Range   Phosphorus 4.2 2.5 - 4.6 mg/dL  CBC WITH DIFFERENTIAL   Collection Time: 04/17/21  5:25 AM  Result Value Ref Range   WBC 4.7 4.0 - 10.5 K/uL   RBC 4.62 3.87 - 5.11 MIL/uL   Hemoglobin 10.4 (L) 12.0 - 15.0 g/dL   HCT 35.7 (L) 36.0 - 46.0 %   MCV 77.3 (L) 80.0 - 100.0 fL   MCH 22.5 (L) 26.0 - 34.0 pg   MCHC 29.1 (L) 30.0 - 36.0 g/dL   RDW 21.4 (H) 11.5 - 15.5 %   Platelets 323 150 - 400 K/uL   nRBC 0.0 0.0 - 0.2 %   Neutrophils Relative % 58 %   Neutro Abs 2.7 1.7 - 7.7 K/uL   Lymphocytes Relative 21 %   Lymphs Abs 1.0 0.7 - 4.0 K/uL  Monocytes Relative 15 %   Monocytes Absolute 0.7 0.1 - 1.0 K/uL   Eosinophils Relative 5 %   Eosinophils Absolute 0.2 0.0 - 0.5 K/uL   Basophils Relative 1 %   Basophils Absolute 0.1 0.0 - 0.1 K/uL   RBC Morphology ELLIPTOCYTES    Immature Granulocytes 0 %   Abs Immature Granulocytes 0.01 0.00 - 0.07 K/uL   Schistocytes PRESENT   TSH   Collection Time: 04/17/21  5:25 AM  Result Value Ref Range   TSH 2.511 0.350 - 4.500 uIU/mL  Comprehensive metabolic panel   Collection Time: 04/17/21  5:25 AM  Result Value Ref Range   Sodium 140 135 - 145 mmol/L   Potassium 3.7 3.5 - 5.1 mmol/L   Chloride 107 98 - 111 mmol/L   CO2 27 22 - 32 mmol/L   Glucose, Bld 96 70 - 99 mg/dL   BUN 14 6 - 20 mg/dL   Creatinine, Ser 0.71 0.44 - 1.00 mg/dL   Calcium 8.6 (L) 8.9 - 10.3 mg/dL   Total Protein 7.0 6.5 - 8.1 g/dL   Albumin 3.3 (L) 3.5 - 5.0 g/dL   AST 24 15 - 41 U/L   ALT 16 0 - 44 U/L   Alkaline Phosphatase 77 38  - 126 U/L   Total Bilirubin 1.3 (H) 0.3 - 1.2 mg/dL   GFR, Estimated >60 >60 mL/min   Anion gap 6 5 - 15  Procalcitonin - Baseline   Collection Time: 04/17/21  5:25 AM  Result Value Ref Range   Procalcitonin <0.10 ng/mL  ECHOCARDIOGRAM COMPLETE   Collection Time: 04/17/21 12:58 PM  Result Value Ref Range   Weight 5,600 oz   Height 69 in   BP 113/81 mmHg   Single Plane A2C EF 42.1 %   Single Plane A4C EF 50.0 %   Calc EF 46.2 %   S' Lateral 4.40 cm   Radius 0.35 cm   MV M vel 4.34 m/s   MV Peak grad 75.2 mmHg  Procalcitonin   Collection Time: 04/18/21  5:53 AM  Result Value Ref Range   Procalcitonin <0.10 ng/mL  Comprehensive metabolic panel   Collection Time: 04/18/21  5:53 AM  Result Value Ref Range   Sodium 140 135 - 145 mmol/L   Potassium 3.6 3.5 - 5.1 mmol/L   Chloride 102 98 - 111 mmol/L   CO2 29 22 - 32 mmol/L   Glucose, Bld 99 70 - 99 mg/dL   BUN 13 6 - 20 mg/dL   Creatinine, Ser 0.79 0.44 - 1.00 mg/dL   Calcium 9.0 8.9 - 10.3 mg/dL   Total Protein 7.3 6.5 - 8.1 g/dL   Albumin 3.2 (L) 3.5 - 5.0 g/dL   AST 23 15 - 41 U/L   ALT 16 0 - 44 U/L   Alkaline Phosphatase 77 38 - 126 U/L   Total Bilirubin 1.2 0.3 - 1.2 mg/dL   GFR, Estimated >60 >60 mL/min   Anion gap 9 5 - 15  CBC   Collection Time: 04/18/21  5:53 AM  Result Value Ref Range   WBC 5.4 4.0 - 10.5 K/uL   RBC 4.67 3.87 - 5.11 MIL/uL   Hemoglobin 10.4 (L) 12.0 - 15.0 g/dL   HCT 36.3 36.0 - 46.0 %   MCV 77.7 (L) 80.0 - 100.0 fL   MCH 22.3 (L) 26.0 - 34.0 pg   MCHC 28.7 (L) 30.0 - 36.0 g/dL   RDW 21.3 (H) 11.5 - 15.5 %  Platelets 323 150 - 400 K/uL   nRBC 0.0 0.0 - 0.2 %  Magnesium   Collection Time: 04/18/21  5:53 AM  Result Value Ref Range   Magnesium 1.8 1.7 - 2.4 mg/dL  Basic metabolic panel   Collection Time: 04/19/21  5:31 AM  Result Value Ref Range   Sodium 138 135 - 145 mmol/L   Potassium 3.4 (L) 3.5 - 5.1 mmol/L   Chloride 99 98 - 111 mmol/L   CO2 32 22 - 32 mmol/L   Glucose, Bld  88 70 - 99 mg/dL   BUN 14 6 - 20 mg/dL   Creatinine, Ser 0.78 0.44 - 1.00 mg/dL   Calcium 9.0 8.9 - 10.3 mg/dL   GFR, Estimated >60 >60 mL/min   Anion gap 7 5 - 15  Magnesium   Collection Time: 04/19/21  5:31 AM  Result Value Ref Range   Magnesium 1.8 1.7 - 2.4 mg/dL  CBC   Collection Time: 04/19/21  5:31 AM  Result Value Ref Range   WBC 4.8 4.0 - 10.5 K/uL   RBC 4.71 3.87 - 5.11 MIL/uL   Hemoglobin 10.5 (L) 12.0 - 15.0 g/dL   HCT 35.9 (L) 36.0 - 46.0 %   MCV 76.2 (L) 80.0 - 100.0 fL   MCH 22.3 (L) 26.0 - 34.0 pg   MCHC 29.2 (L) 30.0 - 36.0 g/dL   RDW 20.9 (H) 11.5 - 15.5 %   Platelets 332 150 - 400 K/uL   nRBC 0.0 0.0 - 0.2 %  Basic metabolic panel   Collection Time: 04/20/21  4:33 AM  Result Value Ref Range   Sodium 136 135 - 145 mmol/L   Potassium 4.1 3.5 - 5.1 mmol/L   Chloride 98 98 - 111 mmol/L   CO2 34 (H) 22 - 32 mmol/L   Glucose, Bld 90 70 - 99 mg/dL   BUN 14 6 - 20 mg/dL   Creatinine, Ser 0.81 0.44 - 1.00 mg/dL   Calcium 9.0 8.9 - 10.3 mg/dL   GFR, Estimated >60 >60 mL/min   Anion gap 4 (L) 5 - 15  Magnesium   Collection Time: 04/20/21  4:33 AM  Result Value Ref Range   Magnesium 2.0 1.7 - 2.4 mg/dL  Basic metabolic panel   Collection Time: 04/21/21  5:05 AM  Result Value Ref Range   Sodium 136 135 - 145 mmol/L   Potassium 4.5 3.5 - 5.1 mmol/L   Chloride 97 (L) 98 - 111 mmol/L   CO2 32 22 - 32 mmol/L   Glucose, Bld 90 70 - 99 mg/dL   BUN 18 6 - 20 mg/dL   Creatinine, Ser 0.78 0.44 - 1.00 mg/dL   Calcium 9.1 8.9 - 10.3 mg/dL   GFR, Estimated >60 >60 mL/min   Anion gap 7 5 - 15  CBC   Collection Time: 04/21/21  5:05 AM  Result Value Ref Range   WBC 5.7 4.0 - 10.5 K/uL   RBC 4.88 3.87 - 5.11 MIL/uL   Hemoglobin 10.9 (L) 12.0 - 15.0 g/dL   HCT 37.7 36.0 - 46.0 %   MCV 77.3 (L) 80.0 - 100.0 fL   MCH 22.3 (L) 26.0 - 34.0 pg   MCHC 28.9 (L) 30.0 - 36.0 g/dL   RDW 21.2 (H) 11.5 - 15.5 %   Platelets 345 150 - 400 K/uL   nRBC 0.4 (H) 0.0 - 0.2 %     Imaging Studies: US PELVIS (TRANSABDOMINAL ONLY)  Result Date: 04/17/2021 CLINICAL DATA:  Initial evaluation for vaginal bleeding. EXAM: TRANSABDOMINAL ULTRASOUND OF PELVIS TECHNIQUE: Transabdominal ultrasound examination of the pelvis was performed including evaluation of the uterus, ovaries, adnexal regions, and pelvic cul-de-sac. COMPARISON:  Prior CT from earlier the same day. FINDINGS: Examination technically limited by body habitus and patient's inability to position for the exam. Uterus Measurements: 9.3 x 3.1 x 5.7 cm = volume: A 7.6 mL. Uterus is anteverted. No visible discrete fibroid or other myometrial abnormality on this limited exam. Endometrium Not visualized on this transabdominal only exam. Right ovary Not visualized.  No adnexal mass. Left ovary Not visualized.  No adnexal mass. Other findings: Moderate volume free fluid seen within the pelvis, corresponding with appearance on prior CT. IMPRESSION: 1. Technically limited exam with nonvisualization of the endometrium. If there remains clinical concern for possible underlying occult endometrial pathology, further assessment with dedicated sonohysterography could be performed for further evaluation as warranted. Correlation with dedicated pelvic MRI may potentially be helpful as well. 2. Grossly normal sonographic appearance of the uterus. 3. Nonvisualization of either ovary.  No adnexal mass. 4. Moderate volume free fluid within the pelvis, corresponding with appearance on prior CT. Electronically Signed   By: Jeannine Boga M.D.   On: 04/17/2021 00:46   CT Abdomen Pelvis W Contrast  Result Date: 04/16/2021 CLINICAL DATA:  Abdominal pain EXAM: CT ABDOMEN AND PELVIS WITH CONTRAST TECHNIQUE: Multidetector CT imaging of the abdomen and pelvis was performed using the standard protocol following bolus administration of intravenous contrast. CONTRAST:  162m OMNIPAQUE IOHEXOL 350 MG/ML SOLN COMPARISON:  07/13/2005 FINDINGS: Lower  chest: Small bilateral pleural effusions are seen, more so on the right side. There are linear patchy infiltrates in both lower lung fields, more so on the right side. Heart is enlarged in size. Hepatobiliary: Liver measures 21.9 cm in length. There is no dilation of bile ducts. Gallbladder is unremarkable. Pancreas: No focal abnormality is seen. Spleen: Unremarkable. Adrenals/Urinary Tract: Adrenals are unremarkable. There is no hydronephrosis. There is 9 mm calculus in the lower pole of left kidney. There are no demonstrable opaque ureteral stones. Urinary bladder is not distended. Stomach/Bowel: Stomach is not distended. Small bowel loops are not dilated. Appendix is not distinctly seen. There is no wall thickening in colon. Vascular/Lymphatic: There are scattered arterial calcifications. There are slightly enlarged lymph nodes in the retroperitoneum and mesentery. Reproductive: Unremarkable. Other: There is no pneumoperitoneum. Small ascites is present. There is diffuse edema in the subcutaneous plane in the abdominal wall. Small umbilical hernia containing fat is seen. Musculoskeletal: Degenerative changes are noted in the lumbar spine, more so at L5-S1 level. Degenerative changes are noted in the lower thoracic spine. IMPRESSION: There is no evidence intestinal obstruction or pneumoperitoneum. There is no hydronephrosis. There is 9 mm calculus in the lower pole of left kidney. Small ascites. There is diffuse edema in the subcutaneous plane in the abdominal wall suggesting anasarca. Small bilateral pleural effusions are seen, more so on the right side. There are patchy infiltrates in both lower lung fields suggesting atelectasis/pneumonia. Cardiomegaly. Other findings as described in the body of the report. Electronically Signed   By: PElmer PickerM.D.   On: 04/16/2021 19:46    Assessment: Principal Problem:   Anasarca Active Problems:   Lower urinary tract infectious disease   Morbid obesity  (HCC)   Vaginal bleeding   Iron deficiency anemia due to chronic blood loss   Cardiomegaly   Chest pain   [place]rial flutter [place]   CAP (community acquired pneumonia)  Pressure injury of skin   Plan: Postmenopausal bleeding - Discussed the potential causes for PMB - atrophy, polyp, hyperplasia, malignancy - Her [place] was nondiagnostic based on the report and my independent review of the images. Additional imaging would not be beneficial at this time - I would recommend outpatient EMB and pap smear which I discussed with the patient. I would not be able to do this in the hospital chair/bed but we have the ability to do this in the office. Reviewed that follow up will be extremely important to rule out endometrial and cervical cancer. She does have risk factors for endometrial hyperplasia based on her BMI and bleeding - Based on her description of her bleeding only occasionally occurring and only more recently, I do not think this is the main contributor to her anemia - Nevertheless, I spoke with her about continuing the Megace which will help her bleeding especially in light of the plan for blood thinners. We discussed it helps the bleeding but also if hyperplasia or endometrial cancer, it might be the preferred form of therapy over surgery given her other co-morbidities.  - At this time, I have her on Megace 40 BID but this can be titrated up to 80 or 160 BID if she had bleeding through it.  - I put our office information for her to call upon discharge. Pt states she will call as she will want to get this taken care of in case it is malignancy.  - In the meantime, please treat the patient as needed for her other medical conditions. If additional questions, please feel free to call the [facility] Second Attending via [place]. Thank you for this consult.    Radene Gunning, MD, [place] for [facility] [place], [place]

## 2021-04-22 LAB — CBC
HCT: 38.7 % (ref 36.0–46.0)
Hemoglobin: 11.3 g/dL — ABNORMAL LOW (ref 12.0–15.0)
MCH: 22.5 pg — ABNORMAL LOW (ref 26.0–34.0)
MCHC: 29.2 g/dL — ABNORMAL LOW (ref 30.0–36.0)
MCV: 77.1 fL — ABNORMAL LOW (ref 80.0–100.0)
Platelets: 385 10*3/uL (ref 150–400)
RBC: 5.02 MIL/uL (ref 3.87–5.11)
RDW: 21.5 % — ABNORMAL HIGH (ref 11.5–15.5)
WBC: 6.1 10*3/uL (ref 4.0–10.5)
nRBC: 0.5 % — ABNORMAL HIGH (ref 0.0–0.2)

## 2021-04-22 LAB — BASIC METABOLIC PANEL
Anion gap: 9 (ref 5–15)
BUN: 19 mg/dL (ref 6–20)
CO2: 30 mmol/L (ref 22–32)
Calcium: 8.9 mg/dL (ref 8.9–10.3)
Chloride: 97 mmol/L — ABNORMAL LOW (ref 98–111)
Creatinine, Ser: 0.82 mg/dL (ref 0.44–1.00)
GFR, Estimated: >60 mL/min (ref >60)
Glucose, Bld: 95 mg/dL (ref 70–99)
Potassium: 4.1 mmol/L (ref 3.5–5.1)
Sodium: 136 mmol/L (ref 135–145)

## 2021-04-22 MED ORDER — APIXABAN 5 MG PO TABS
5.0000 mg | ORAL_TABLET | Freq: Two times a day (BID) | ORAL | Status: DC
  Administered 2021-04-22 – 2021-05-06 (×29): 5 mg via ORAL
  Filled 2021-04-22 (×29): qty 1

## 2021-04-22 NOTE — Plan of Care (Signed)
Pt aox4, pleasant and cooperative with staff.  Continued IV Lasix and fluid restriction per orders.  Cards, GYN consulted.  Plan for TEE with possible Cardioversion on Monday.   Problem: Education: Goal: Knowledge of General Education information will improve Description: Including pain rating scale, medication(s)/side effects and non-pharmacologic comfort measures Outcome: Progressing   Problem: Health Behavior/Discharge Planning: Goal: Ability to manage health-related needs will improve Outcome: Progressing   Problem: Clinical Measurements: Goal: Ability to maintain clinical measurements within normal limits will improve Outcome: Progressing Goal: Will remain free from infection Outcome: Progressing Goal: Diagnostic test results will improve Outcome: Progressing Goal: Respiratory complications will improve Outcome: Progressing Goal: Cardiovascular complication will be avoided Outcome: Progressing   Problem: Activity: Goal: Risk for activity intolerance will decrease Outcome: Progressing   Problem: Nutrition: Goal: Adequate nutrition will be maintained Outcome: Progressing   Problem: Coping: Goal: Level of anxiety will decrease Outcome: Progressing   Problem: Elimination: Goal: Will not experience complications related to bowel motility Outcome: Progressing Goal: Will not experience complications related to urinary retention Outcome: Progressing   Problem: Pain Managment: Goal: General experience of comfort will improve Outcome: Progressing   Problem: Safety: Goal: Ability to remain free from injury will improve Outcome: Progressing   Problem: Skin Integrity: Goal: Risk for impaired skin integrity will decrease Outcome: Progressing

## 2021-04-22 NOTE — Progress Notes (Signed)
[facility] has been requested to perform a transesophageal echocardiogram on Amber Herring for atrial flutter.  After careful review of history and examination, the risks and benefits of transesophageal echocardiogram have been explained including risks of esophageal damage, perforation (1:10,000 risk), bleeding, pharyngeal hematoma as well as other potential complications associated with conscious sedation including aspiration, arrhythmia, respiratory failure and death. Alternatives to treatment were discussed, questions were answered. Patient is willing to proceed.   She is scheduled for TEE-guided cardioversion on Monday 04/26/21. NPO Sunday night after MN.  Tami Lin Jacey Eckerson, [place]  04/22/2021 1:28 PM

## 2021-04-22 NOTE — Discharge Instructions (Signed)
Information on my medicine - ELIQUIS (apixaban)  This medication education was reviewed with me or my healthcare representative as part of my discharge preparation.  The pharmacist that spoke with me during my hospital stay was:  Emiliano Dyer, [facility]  Why was Eliquis prescribed for you? Eliquis was prescribed for you to reduce the risk of a blood clot forming that can cause a stroke if you have a medical condition called atrial fibrillation (a type of irregular heartbeat).  What do You need to know about Eliquis ? Take your Eliquis TWICE DAILY - one tablet in the morning and one tablet in the evening with or without food. If you have difficulty swallowing the tablet whole please discuss with your pharmacist how to take the medication safely.  Take Eliquis exactly as prescribed by your doctor and DO NOT stop taking Eliquis without talking to the doctor who prescribed the medication.  Stopping may increase your risk of developing a stroke.  Refill your prescription before you run out.  After discharge, you should have regular check-up appointments with your healthcare provider that is prescribing your Eliquis.  In the future your dose may need to be changed if your kidney function or weight changes by a significant amount or as you get older.  What do you do if you miss a dose? If you miss a dose, take it as soon as you remember on the same day and resume taking twice daily.  Do not take more than one dose of ELIQUIS at the same time to make up a missed dose.  Important Safety Information A possible side effect of Eliquis is bleeding. You should call your healthcare provider right away if you experience any of the following: Bleeding from an injury or your nose that does not stop. Unusual colored urine (red or dark brown) or unusual colored stools (red or black). Unusual bruising for unknown reasons. A serious fall or if you hit your head (even if there is no bleeding).  Some  medicines may interact with Eliquis and might increase your risk of bleeding or clotting while on Eliquis. To help avoid this, consult your healthcare provider or pharmacist prior to using any new prescription or non-prescription medications, including herbals, vitamins, non-steroidal anti-inflammatory drugs (NSAIDs) and supplements.  This website has more information on Eliquis (apixaban): http://www.eliquis.com/eliquis/home

## 2021-04-22 NOTE — Progress Notes (Signed)
Progress Note  Patient Name: Amber Herring Date of Encounter: 04/22/2021  [facility] Cardiologist: Pixie Casino, MD   Subjective   Mild dyspnea.  Chest heaviness earlier that resolved with changing positions.  Inpatient Medications    Scheduled Meds:  (feeding supplement) PROSource Plus  30 mL Oral BID BM   amiodarone  400 mg Oral BID   ferrous sulfate  325 mg Oral TID WC   furosemide  40 mg Intravenous BID   heparin injection (subcutaneous)  5,000 Units Subcutaneous Q8H   megestrol  40 mg Oral BID   metoprolol tartrate  50 mg Oral BID   potassium chloride  20 mEq Oral TID   Ensure Max Protein  11 oz Oral Daily   sodium chloride flush  3 mL Intravenous Q12H   Continuous Infusions:  sodium chloride     PRN Meds: sodium chloride, acetaminophen **OR** [DISCONTINUED] acetaminophen, oxyCODONE, sodium chloride flush, traMADol   Vital Signs    Vitals:   04/21/21 0520 04/21/21 1448 04/21/21 2123 04/22/21 0553  BP: 103/65 106/72 111/81 92/61  Pulse:  (!) 106 (!) 108 76  Resp: _0 Temp: 97.9 F (36.6 C) 98.3 F (36.8 C) 98.2 F (36.8 C) 97.8 F (36.6 C)  TempSrc: Oral Oral    SpO2: 96% 99% 96% 95%  Weight: (!) 160.3 kg     Height:        Intake/Output Summary (Last 24 hours) at 04/22/2021 0829 Last data filed at 04/22/2021 0729 Gross per 24 hour  Intake 1298 ml  Output 6650 ml  Net -5352 ml    Last 3 Weights 04/21/2021 04/16/2021 02/02/2021  Weight (lbs) 353 lb 6.4 oz 350 lb 360 lb  Weight (kg) 160.3 kg 158.759 kg 163.295 kg      Telemetry    Atrial flutter rate controlled - Personally Reviewed    Physical Exam   GEN: WD obese Neck: supple Cardiac: irregular, normal rate Respiratory: Clear to auscultation bilaterally; no wheeze GI: Soft, NT/ND MS: 1+ edema Neuro:  Grossly intact Psych: Normal affect   Labs    High Sensitivity Troponin:   Recent Labs  Lab 04/16/21 1721 04/16/21 1827  TROPONINIHS 13 12      Chemistry Recent  Labs  Lab 04/16/21 1721 04/16/21 1721 04/17/21 0525 04/18/21 0553 04/19/21 0531 04/20/21 0433 04/21/21 0505 04/22/21 0542  NA 139  --  140 140 138 136 136 136  K 3.7  --  3.7 3.6 3.4* 4.1 4.5 4.1  CL 105  --  107 102 99 98 97* 97*  CO2 27  --  27 29 32 34* 32 30  GLUCOSE 79  --  96 99 88 90 90 95  BUN 14  --  _1 CREATININE 0.78  --  0.71 0.79 0.78 0.81 0.78 0.82  CALCIUM 8.7*  --  8.6* 9.0 9.0 9.0 9.1 8.9  MG  --    < > 1.8 1.8 1.8 2.0  --   --   PROT 7.5  --  7.0 7.3  --   --   --   --   ALBUMIN 3.5  --  3.3* 3.2*  --   --   --   --   AST 29  --  24 23  --   --   --   --   ALT 19  --  16 16  --   --   --   --  ALKPHOS 87  --  77 77  --   --   --   --   BILITOT 1.8*  --  1.3* 1.2  --   --   --   --   GFRNONAA >60  --  >60 >60 >60 >60 >60 >60  ANIONGAP 7  --  _0 4* 7 9   < > = values in this interval not displayed.      Hematology Recent Labs  Lab 04/19/21 0531 04/21/21 0505 04/22/21 0542  WBC 4.8 5.7 6.1  RBC 4.71 4.88 5.02  HGB 10.5* 10.9* 11.3*  HCT 35.9* 37.7 38.7  MCV 76.2* 77.3* 77.1*  MCH 22.3* 22.3* 22.5*  MCHC 29.2* 28.9* 29.2*  RDW 20.9* 21.2* 21.5*  PLT 332 345 385    Thyroid  Recent Labs  Lab 04/17/21 0525  TSH 2.511     BNP Recent Labs  Lab 04/16/21 1722  BNP 322.0*     DDimer  Recent Labs  Lab 04/17/21 0220  DDIMER 1.62*      Cardiac Studies   Echo 04/17/21   1. Left ventricular ejection fraction, by estimation, is 45 to 50%. Left  ventricular ejection fraction by 2D MOD biplane is 46.2 %. The left  ventricle has mildly decreased function. The left ventricle demonstrates  global hypokinesis. Indeterminate  diastolic filling due to E-A fusion.   2. Right ventricular systolic function is normal. The right ventricular  size is moderately enlarged. There is mildly elevated pulmonary artery  systolic pressure. The estimated right ventricular systolic pressure is  00.4 mmHg.   3. Right atrial size was  severely dilated.   4. The mitral valve is abnormal. Mild mitral valve regurgitation.   5. Tricuspid valve regurgitation is mild to moderate.   6. The aortic valve is tricuspid. Aortic valve regurgitation is not  visualized.   7. The inferior vena cava is dilated in size with <50% respiratory  variability, suggesting right atrial pressure of 15 mmHg.   Comparison(s): No prior Echocardiogram.   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 She was initially tachycardic felt due to sinus tach. Found to have CHF with EF 45-50% without prior to compare to.   Assessment & Plan    1 acute on chronic combined systolic/diastolic congestive heart failure-good diuresis but remains volume overloaded.  Continue Lasix at present dose.  Follow potassium and renal function.  Will not add Farxiga due to obesity/risk of fungal infection.  2 atrial flutter-patient remains in atrial flutter versus ectopic atrial tachycardia and I think this is likely contributing to her congestive heart failure.  We will continue metoprolol and amiodarone.  GYN has evaluated patient and plans outpatient follow-up.  Her hemoglobin is stable.  I will begin apixaban 5 mg twice daily and follow over the weekend for any recurrent bleeding/anemia.  If she remains stable we will try to arrange TEE guided cardioversion on Monday.  Following discharge would like for her to be evaluated by electrophysiology for consideration of ablation.  3 morbid obesity-we will need evaluation for obstructive sleep apnea as an outpatient.  She is noted to have right atrial enlargement/tricuspid regurgitation on her echocardiogram likely related to pulmonary hypertension from sleep apnea and obesity hypoventilation syndrome.  4 vaginal bleeding-GYN has evaluated and plans  outpatient follow-up.  Megace added.  5 microcytic anemia-hemoglobin unchanged.  Follow closely given initiation of apixaban.  For questions or updates, please contact [place] Please consult [email or web address] for contact info under        Signed, Kirk Ruths, MD  04/22/2021, 8:29 AM

## 2021-04-22 NOTE — Progress Notes (Signed)
PROGRESS NOTE    Amber Herring  ZOX:[id number] DOB: [birth year] DOA: 04/16/2021 PCP: Denita Lung, MD   Brief Narrative:  This 61 yrs old Morbidly obese female with PMH significant for depression, chronic anemia, GERD, and HLD who presented to the ED with c/o: generalized swelling primarily of the legs and abdomen. ROS was also positive for intermittent dysfunctional vaginal bleeding.  Patient is admitted for acute systolic CHF requiring IV diuresis.  Hospital course complicated by atrial fibrillation requiring amiodarone, Cardiology is consulted, GYN is consulted for  vaginal bleeding, started on Megace and outpatient follow-up recommended.  Her hemoglobin remained stable,  Patient is started on Eliquis.  She will be monitored over the weekend for any recurrent bleeding.  If she remains stable , on Monday she would be considered for TEE guided cardioversion.  Assessment & Plan:   Principal Problem:   Anasarca Active Problems:   Lower urinary tract infectious disease   Morbid obesity ([place])   Vaginal bleeding   Iron deficiency anemia due to chronic blood loss   Cardiomegaly   Chest pain   Atrial flutter (HCC)   CAP (community acquired pneumonia)   Pressure injury of skin  Acute on chronic combined systolic and diastolic  CHF /Anasarca: Patient presented with generalized body swelling more pronounced in the legs and abdomen.   Echocardiogram shows LVEF 45 to 50% with global hypokinesis and mildly dilated PASP.  Cardiology consulted. Continue aggressive diuresis, Net negative balance 15 L since admission. Continue daily weight, intake output charting. Continue IV Lasix 40 mg every 8 hours. Venous duplex negative for DVT.   Intake/Output Summary (Last 24 hours) at 04/22/2021 1245 Last data filed at 04/22/2021 1019 Gross per 24 hour  Intake 1298 ml  Output 7750 ml  Net -6452 ml     Atrial Flutter /Ectopic atrial tachycardia EKG showed atrial flutter versus ectopic atrial  tachycardia. This is contributing to the CHF exacerbation. Cardiology recommended to continue metoprolol and amiodarone for rate control. Patient is started on Eliquis, she would be monitored over the weekend for recurrent bleeding. Plan is for TEE guided cardioversion on Monday.  Hypokalemia: Replaced and resolved.  Hypomagnesemia: Replaced and resolved.  Morbid obesity: Diet and exercise discussed in detail  Vaginal bleeding: H&H remained stable. Pelvic ultrasound without acute findings, not able to visualize all structures. No evidence of ongoing bleeding presently. GYN consulted, started patient on Megace and recommended outpatient follow-up.  Iron deficiency anemia: Patient has received IV iron therapy. H / H stable.  UTI: No clear signs and symptoms to suggest  UTI. Continue to monitor off antibiotics.  Pneumonia ruled out.: Normal procalcitonin, normal WBCs, Antibiotic discontinued.   DVT prophylaxis: SCDs Code Status: Full code. Family Communication:  No family at bed side. Disposition Plan:   Admitted for generalized anasarca /  CHF exacerbation requiring IV diuresis. Hospital course complicated by atrial fibrillation, continuous dysfunctional uterine bleeding. Patient is started on Megace for dysfunctional uterine bleeding, Patient started on Eliquis for A. Fib.   Plan is for TEE guided cardioversion on Monday   Consultants:  Cardiology  Procedures: Echocardiogram. Antimicrobials:   Anti-infectives (From admission, onward)    Start     Dose/Rate Route Frequency Ordered Stop   04/17/21 2100  cefTRIAXone (ROCEPHIN) 1 g in sodium chloride 0.9 % 100 mL IVPB  Status:  Discontinued        1 g 200 mL/hr over 30 Minutes Intravenous Every 24 hours 04/16/21 2129 04/17/21 1821   04/17/21 0145  azithromycin (ZITHROMAX) 500 mg in sodium chloride 0.9 % 250 mL IVPB  Status:  Discontinued        500 mg 250 mL/hr over 60 Minutes Intravenous Every 24 hours 04/17/21  0132 04/17/21 1821   04/16/21 2045  cefTRIAXone (ROCEPHIN) 1 g in sodium chloride 0.9 % 100 mL IVPB        1 g 200 mL/hr over 30 Minutes Intravenous  Once 04/16/21 2042 04/16/21 2130       Subjective: Patient was seen and examined at bedside.  Overnight events noted.   She reports feeling better, she still appears volume overloaded. She denies any chest pain but reports some exertional shortness of breath.  Objective: Vitals:   04/21/21 0520 04/21/21 1448 04/21/21 2123 04/22/21 0553  BP: 103/65 106/72 111/81 92/61  Pulse:  (!) 106 (!) 108 76  Resp: _0 Temp: 97.9 F (36.6 C) 98.3 F (36.8 C) 98.2 F (36.8 C) 97.8 F (36.6 C)  TempSrc: Oral Oral    SpO2: 96% 99% 96% 95%  Weight: (!) 160.3 kg     Height:        Intake/Output Summary (Last 24 hours) at 04/22/2021 1245 Last data filed at 04/22/2021 1019 Gross per 24 hour  Intake 1298 ml  Output 7750 ml  Net -6452 ml   Filed Weights   04/16/21 1509 04/21/21 0520  Weight: (!) 158.8 kg (!) 160.3 kg    Examination:  General exam: Morbidly obese, deconditioned, not in any acute distress, comfortable. Respiratory system: Decreased breath sounds bilaterally, no wheezing, no crackles. Cardiovascular system: S1-S2 heard, Irregular rhythm , no murmur. Gastrointestinal system: Abdomen is soft, mildly distended, nontender, BS+ Central nervous system: Alert and oriented x 3 . No focal neurological deficits. Extremities: Bilateral pedal edema+, no cyanosis, no clubbing. Skin: No rashes, lesions or ulcers. Psychiatry: Judgement and insight appear normal. Mood & affect appropriate.     Data Reviewed: I have personally reviewed following labs and imaging studies  CBC: Recent Labs  Lab 04/16/21 1721 04/17/21 0525 04/18/21 0553 04/19/21 0531 04/21/21 0505 04/22/21 0542  WBC 5.1 4.7 5.4 4.8 5.7 6.1  NEUTROABS 3.0 2.7  --   --   --   --   HGB 10.9* 10.4* 10.4* 10.5* 10.9* 11.3*  HCT 37.5 35.7* 36.3 35.9* 37.7 38.7  MCV  77.2* 77.3* 77.7* 76.2* 77.3* 77.1*  PLT 315 323 323 332 345 572   Basic Metabolic Panel: Recent Labs  Lab 04/17/21 0525 04/18/21 0553 04/19/21 0531 04/20/21 0433 04/21/21 0505 04/22/21 0542  NA 140 140 138 136 136 136  K 3.7 3.6 3.4* 4.1 4.5 4.1  CL 107 102 99 98 97* 97*  CO2 27 29 32 34* 32 30  GLUCOSE 96 99 88 90 90 95  BUN _1 CREATININE 0.71 0.79 0.78 0.81 0.78 0.82  CALCIUM 8.6* 9.0 9.0 9.0 9.1 8.9  MG 1.8 1.8 1.8 2.0  --   --   PHOS 4.2  --   --   --   --   --    GFR: Estimated Creatinine Clearance: 122.5 mL/min (by C-G formula based on SCr of 0.82 mg/dL). Liver Function Tests: Recent Labs  Lab 04/16/21 1721 04/17/21 0525 04/18/21 0553  AST _2 ALT _3 ALKPHOS 87 77 77  BILITOT 1.8* 1.3* 1.2  PROT 7.5 7.0 7.3  ALBUMIN 3.5 3.3* 3.2*   No results for input(s): LIPASE, AMYLASE  in the last 168 hours. No results for input(s): AMMONIA in the last 168 hours. Coagulation Profile: No results for input(s): INR, PROTIME in the last 168 hours. Cardiac Enzymes: No results for input(s): CKTOTAL, CKMB, CKMBINDEX, TROPONINI in the last 168 hours. BNP (last 3 results) No results for input(s): PROBNP in the last 8760 hours. HbA1C: No results for input(s): HGBA1C in the last 72 hours. CBG: No results for input(s): GLUCAP in the last 168 hours. Lipid Profile: No results for input(s): CHOL, HDL, LDLCALC, TRIG, CHOLHDL, LDLDIRECT in the last 72 hours. Thyroid Function Tests: No results for input(s): TSH, T4TOTAL, FREET4, T3FREE, THYROIDAB in the last 72 hours. Anemia Panel: No results for input(s): VITAMINB12, FOLATE, FERRITIN, TIBC, IRON, RETICCTPCT in the last 72 hours. Sepsis Labs: Recent Labs  Lab 04/17/21 0525 04/18/21 0553  PROCALCITON <0.10 <0.10    Recent Results (from the past 240 hour(s))  Resp Panel by RT-PCR (Flu A&B, Covid) Nasopharyngeal Swab     Status: None   Collection Time: 04/16/21  5:20 PM   Specimen: Nasopharyngeal  Swab; Nasopharyngeal(NP) swabs in vial transport medium  Result Value Ref Range Status   SARS Coronavirus 2 by RT PCR NEGATIVE NEGATIVE Final    Comment: (NOTE) SARS-CoV-2 target nucleic acids are NOT DETECTED.  The SARS-CoV-2 RNA is generally detectable in upper respiratory specimens during the acute phase of infection. The lowest concentration of SARS-CoV-2 viral copies this assay can detect is 138 copies/mL. A negative result does not preclude SARS-Cov-2 infection and should not be used as the sole basis for treatment or other patient management decisions. A negative result may occur with  improper specimen collection/handling, submission of specimen other than nasopharyngeal swab, presence of viral mutation(s) within the areas targeted by this assay, and inadequate number of viral copies(<138 copies/mL). A negative result must be combined with clinical observations, patient history, and epidemiological information. The expected result is Negative.  Fact Sheet for Patients:  EntrepreneurPulse.com.au  Fact Sheet for Healthcare Providers:  IncredibleEmployment.be  This test is no t yet approved or cleared by the [place] FDA and  has been authorized for detection and/or diagnosis of SARS-CoV-2 by FDA under an Emergency Use Authorization (EUA). This EUA will remain  in effect (meaning this test can be used) for the duration of the COVID-19 declaration under Section 564(b)(1) of the Act, 21 U.S.C.section 360bbb-3(b)(1), unless the authorization is terminated  or revoked sooner.       Influenza A by PCR NEGATIVE NEGATIVE Final   Influenza B by PCR NEGATIVE NEGATIVE Final    Comment: (NOTE) The Xpert Xpress SARS-CoV-2/FLU/RSV plus assay is intended as an aid in the diagnosis of influenza from Nasopharyngeal swab specimens and should not be used as a sole basis for treatment. Nasal washings and aspirates are unacceptable for Xpert Xpress  SARS-CoV-2/FLU/RSV testing.  Fact Sheet for Patients: EntrepreneurPulse.com.au  Fact Sheet for Healthcare Providers: IncredibleEmployment.be  This test is not yet approved or cleared by the [place] FDA and has been authorized for detection and/or diagnosis of SARS-CoV-2 by FDA under an Emergency Use Authorization (EUA). This EUA will remain in effect (meaning this test can be used) for the duration of the COVID-19 declaration under Section 564(b)(1) of the Act, 21 U.S.C. section 360bbb-3(b)(1), unless the authorization is terminated or revoked.  Performed at [facility], [place] [address] [place], [place] [address]     Radiology Studies: No results found.  Scheduled Meds:  (feeding supplement) PROSource Plus  30 mL  Oral BID BM   amiodarone  400 mg Oral BID   apixaban  5 mg Oral BID   ferrous sulfate  325 mg Oral TID WC   furosemide  40 mg Intravenous BID   megestrol  40 mg Oral BID   metoprolol tartrate  50 mg Oral BID   potassium chloride  20 mEq Oral TID   Ensure Max Protein  11 oz Oral Daily   sodium chloride flush  3 mL Intravenous Q12H   Continuous Infusions:  sodium chloride       LOS: 5 days    Time spent: 35 min    Bernadean Saling, MD [facility]   If 7PM-7AM, please contact night-coverage

## 2021-04-22 NOTE — TOC Progression Note (Signed)
Transition of Care [facility]) - Progression Note    Patient Details  Name: Amber Herring MRN: [id number] Date of Birth: 1960/06/19  Transition of Care [facility]) CM/SW [place], [place] Phone Number: 04/22/2021, 11:09 AM  Clinical Narrative:   Spoke with Maudie Mercury with [facility] and Percy with [place].  Neither are currently taking LOGs. TOC will continue to follow during the course of hospitalization.     Expected Discharge Plan: Home/Self Care Barriers to Discharge: No Barriers Identified  Expected Discharge Plan and Services Expected Discharge Plan: Home/Self Care In-house Referral: Clinical Social Work     Living arrangements for the past 2 months: Apartment                                       Social Determinants of Health (SDOH) Interventions    Readmission Risk Interventions No flowsheet data found.

## 2021-04-23 LAB — BASIC METABOLIC PANEL
Anion gap: 11 (ref 5–15)
BUN: 23 mg/dL — ABNORMAL HIGH (ref 6–20)
CO2: 28 mmol/L (ref 22–32)
Calcium: 9.3 mg/dL (ref 8.9–10.3)
Chloride: 98 mmol/L (ref 98–111)
Creatinine, Ser: 0.97 mg/dL (ref 0.44–1.00)
GFR, Estimated: >60 mL/min (ref >60)
Glucose, Bld: 115 mg/dL — ABNORMAL HIGH (ref 70–99)
Potassium: 4.4 mmol/L (ref 3.5–5.1)
Sodium: 137 mmol/L (ref 135–145)

## 2021-04-23 MED ORDER — ALUM & MAG HYDROXIDE-SIMETH 200-200-20 MG/5ML PO SUSP: 15.0000 mL | Freq: Four times a day (QID) | ORAL | Status: DC | PRN

## 2021-04-23 MED ORDER — PANTOPRAZOLE SODIUM 40 MG PO TBEC
40.0000 mg | DELAYED_RELEASE_TABLET | Freq: Every day | ORAL | Status: DC
  Administered 2021-04-23 – 2021-05-06 (×13): 40 mg via ORAL
  Filled 2021-04-23 (×14): qty 1

## 2021-04-23 NOTE — Progress Notes (Signed)
Physical Therapy Treatment Patient Details Name: Amber Herring MRN: [id number] DOB: 12-17-1960 Today's Date: 04/23/2021   History of Present Illness Patient presents to the ER 04/16/21 with chief complaint of shortness of breath and swelling of her lower abdomen and bilateral lower extremities, anasarca. PMH: recent left hand fx-3 months ago), GERD, obesity, metabolic syndrome, anxiety.    PT Comments    Pt reports being OOB with nursing and recently transferring to [facility], declines getting OOB due to previous activity with nursing and bil knee pain, but agreeable to exercises. Pt cued through BLE strengthening exercises, educated on isometric contraction with good activation noted. Pt with increased bil knee pain with quad set and heel slides with reps, but able to perform in decreased AROM. Therapeutic rest breaks between exercises due to fatigue and knee pain. Assisted BLE into hooklying and pt able to reach headboard to assist in scooting up in bed with min A and bed in trendelenburg position. Educated pt on performing additional sets/reps of exercises as able and pt verbalizes agreement. Will continue to progress as able.   Recommendations for follow up therapy are one component of a multi-disciplinary discharge planning process, led by the attending physician.  Recommendations may be updated based on patient status, additional functional criteria and insurance authorization.  Follow Up Recommendations  Skilled nursing-short term rehab (<3 hours/day)     Assistance Recommended at Discharge Frequent or constant Supervision/Assistance  Patient can return home with the following     Equipment Recommendations  Other (comment) (TBD at next venue)    Recommendations for Other Services       Precautions / Restrictions Precautions Precautions: Fall Precaution Comments: B knee Arthritis and L hand splint from Fx approx a month ago (fall) Required Braces or Orthoses:  Splint/Cast Splint/Cast: has a splint for Left hand - for heavier ADL and hand use. Restrictions Weight Bearing Restrictions: No LUE Weight Bearing: Weight bearing as tolerated Other Position/Activity Restrictions: per outpatient OT notes - use of splint with heavier tasks but otherwise is WBAT.     Mobility  Bed Mobility Overal bed mobility: Needs Assistance  General bed mobility comments: assist to achieve hooklying position with BLE, pt able to grab headboard to pull self, min A with bed in trendelenburg to scoot up in bed; pt declines sitting EOB due to being OOB earlier with nursing    Transfers  General transfer comment: pt declines due to being OOB earlier with nursing, plus bil knee pain    Ambulation/Gait    Stairs             Wheelchair Mobility    Modified Rankin (Stroke Patients Only)       Balance     Cognition Arousal/Alertness: Awake/alert Behavior During Therapy: WFL for tasks assessed/performed Overall Cognitive Status: Within Functional Limits for tasks assessed     Exercises General Exercises - Lower Extremity Ankle Circles/Pumps: Supine;AROM;Both;15 reps Quad Sets: Supine;AROM;Strengthening;Both;10 reps Gluteal Sets: Supine;AROM;Strengthening;Both;10 reps (3 sec isometric hold) Heel Slides: Supine;AROM;Strengthening;Both;10 reps Straight Leg Raises: Supine;AROM;Strengthening;Both;5 reps    General Comments        Pertinent Vitals/Pain Pain Assessment: Faces Faces Pain Scale: Hurts even more Pain Location: B knees (Arthritis) Pain Descriptors / Indicators: Grimacing;Aching;Stabbing Pain Intervention(s): Limited activity within patient's tolerance;Monitored during session;Repositioned    Home Living                          Prior Function  PT Goals (current goals can now be found in the care plan section) Acute Rehab 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 PT Goal Formulation: With patient Time  For Goal Achievement: 05/01/21 Potential to Achieve Goals: Fair Progress towards PT goals: Progressing toward goals    Frequency    Min 2X/week      PT Plan Current plan remains appropriate    Co-evaluation              AM-PAC PT "6 Clicks" Mobility   Outcome Measure  Help needed turning from your back to your side while in a flat bed without using bedrails?: A Lot Help needed moving from lying on your back to sitting on the side of a flat bed without using bedrails?: A Lot Help needed moving to and from a bed to a chair (including a wheelchair)?: A Lot Help needed standing up from a chair using your arms (e.g., wheelchair or bedside chair)?: A Lot Help needed to walk in hospital room?: Total Help needed climbing 3-5 steps with a railing? : Total 6 Click Score: 10    End of Session   Activity Tolerance: Patient limited by pain Patient left: in bed;with call bell/phone within reach Nurse Communication: Mobility status PT Visit Diagnosis: Unsteadiness on feet (R26.81);Difficulty in walking, not elsewhere classified (R26.2);Pain Pain - Right/Left: Left Pain - part of body: Knee     Time: 9458-5929 PT Time Calculation (min) (ACUTE ONLY): 26 min  Charges:  $Therapeutic Exercise: 8-22 mins $Therapeutic Activity: 8-22 mins                       Tori Natlie Asfour PT, DPT 04/23/21, 4:03 PM

## 2021-04-23 NOTE — Progress Notes (Signed)
Progress Note  Patient Name: Amber Herring Date of Encounter: 04/23/2021  [facility] Cardiologist: Pixie Casino, MD   Subjective   Dyspnea continues to improve; no CP; nausea and vomiting earlier this morning.  Inpatient Medications    Scheduled Meds:  (feeding supplement) PROSource Plus  30 mL Oral BID BM   amiodarone  400 mg Oral BID   apixaban  5 mg Oral BID   ferrous sulfate  325 mg Oral TID WC   furosemide  40 mg Intravenous BID   megestrol  40 mg Oral BID   metoprolol tartrate  50 mg Oral BID   potassium chloride  20 mEq Oral TID   Ensure Max Protein  11 oz Oral Daily   sodium chloride flush  3 mL Intravenous Q12H   Continuous Infusions:  sodium chloride     PRN Meds: sodium chloride, acetaminophen **OR** [DISCONTINUED] acetaminophen, oxyCODONE, sodium chloride flush, traMADol   Vital Signs    Vitals:   04/22/21 1927 04/22/21 2118 04/22/21 2126 04/23/21 0401  BP: 105/69 108/73 108/73 105/61  Pulse: 91 95 95 81  Resp: _0 Temp: 98.1 F (36.7 C)  99 F (37.2 C) 98 F (36.7 C)  TempSrc: Oral  Oral Oral  SpO2: 99%  97% 98%  Weight:      Height:        Intake/Output Summary (Last 24 hours) at 04/23/2021 0722 Last data filed at 04/23/2021 0529 Gross per 24 hour  Intake 1080 ml  Output 6700 ml  Net -5620 ml    Last 3 Weights 04/21/2021 04/16/2021 02/02/2021  Weight (lbs) 353 lb 6.4 oz 350 lb 360 lb  Weight (kg) 160.3 kg 158.759 kg 163.295 kg      Telemetry    Atrial flutter rate controlled - Personally Reviewed    Physical Exam   GEN: WD morbidly obese Neck: supple, JVP difficult to assess Cardiac: irregular Respiratory: CTA GI: Soft, NT/ND, abdominal wall edema MS: 1+ edema Neuro:  No focal findings Psych: Normal affect   Labs    High Sensitivity Troponin:   Recent Labs  Lab 04/16/21 1721 04/16/21 1827  TROPONINIHS 13 12      Chemistry Recent Labs  Lab 04/16/21 1721 04/16/21 1721 04/17/21 0525 04/18/21 0553  04/19/21 0531 04/20/21 0433 04/21/21 0505 04/22/21 0542 04/23/21 0529  NA 139  --  140 140 138 136 136 136 137  K 3.7  --  3.7 3.6 3.4* 4.1 4.5 4.1 4.4  CL 105  --  107 102 99 98 97* 97* 98  CO2 27  --  27 29 32 34* 32 30 28  GLUCOSE 79  --  96 99 88 90 90 95 115*  BUN 14  --  _1 23*  CREATININE 0.78  --  0.71 0.79 0.78 0.81 0.78 0.82 0.97  CALCIUM 8.7*  --  8.6* 9.0 9.0 9.0 9.1 8.9 9.3  MG  --    < > 1.8 1.8 1.8 2.0  --   --   --   PROT 7.5  --  7.0 7.3  --   --   --   --   --   ALBUMIN 3.5  --  3.3* 3.2*  --   --   --   --   --   AST 29  --  24 23  --   --   --   --   --  ALT 19  --  16 16  --   --   --   --   --   ALKPHOS 87  --  77 77  --   --   --   --   --   BILITOT 1.8*  --  1.3* 1.2  --   --   --   --   --   GFRNONAA >60  --  >60 >60 >60 >60 >60 >60 >60  ANIONGAP 7  --  _0 4* _1 < > = values in this interval not displayed.      Hematology Recent Labs  Lab 04/19/21 0531 04/21/21 0505 04/22/21 0542  WBC 4.8 5.7 6.1  RBC 4.71 4.88 5.02  HGB 10.5* 10.9* 11.3*  HCT 35.9* 37.7 38.7  MCV 76.2* 77.3* 77.1*  MCH 22.3* 22.3* 22.5*  MCHC 29.2* 28.9* 29.2*  RDW 20.9* 21.2* 21.5*  PLT 332 345 385    Thyroid  Recent Labs  Lab 04/17/21 0525  TSH 2.511     BNP Recent Labs  Lab 04/16/21 1722  BNP 322.0*     DDimer  Recent Labs  Lab 04/17/21 0220  DDIMER 1.62*      Cardiac Studies   Echo 04/17/21   1. Left ventricular ejection fraction, by estimation, is 45 to 50%. Left  ventricular ejection fraction by 2D MOD biplane is 46.2 %. The left  ventricle has mildly decreased function. The left ventricle demonstrates  global hypokinesis. Indeterminate  diastolic filling due to E-A fusion.   2. Right ventricular systolic function is normal. The right ventricular  size is moderately enlarged. There is mildly elevated pulmonary artery  systolic pressure. The estimated right ventricular systolic pressure is  61.9 mmHg.   3. Right  atrial size was severely dilated.   4. The mitral valve is abnormal. Mild mitral valve regurgitation.   5. Tricuspid valve regurgitation is mild to moderate.   6. The aortic valve is tricuspid. Aortic valve regurgitation is not  visualized.   7. The inferior vena cava is dilated in size with <50% respiratory  variability, suggesting right atrial pressure of 15 mmHg.   Comparison(s): No prior Echocardiogram.   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 She was initially tachycardic felt due to sinus tach. Found to have CHF with EF 45-50% without prior to compare to.   Assessment & Plan    1 acute on chronic combined systolic/diastolic congestive heart failure--[id number] since admission; volume status continues to improve.  Continue Lasix at present dose.  Follow potassium and renal function.  Will not add Farxiga due to obesity/risk of fungal infection.  2 atrial flutter-atrial flutter versus ectopic atrial tachycardia persists but rate is controlled.  Continue metoprolol and amiodarone at present dose.  Continue apixaban.  If she has no vaginal bleeding over the weekend then we will proceed with TEE guided cardioversion on Monday.  After discharge we will have her follow-up with electrophysiology for consideration of ablation.    3 morbid obesity-we will need evaluation for obstructive sleep apnea as an outpatient.  She is noted to have right atrial enlargement/tricuspid regurgitation on her echocardiogram likely related to pulmonary hypertension from sleep apnea and obesity hypoventilation syndrome.  4 vaginal bleeding-GYN has evaluated and plans outpatient follow-up.  Megace added.  Continue to follow hemoglobin.  5 microcytic anemia-hemoglobin unchanged.  Follow closely given initiation of apixaban.  For  questions or updates, please contact [place] Please consult [email or web address] for contact info under        Signed, Kirk Ruths, MD  04/23/2021, 7:22 AM

## 2021-04-23 NOTE — Progress Notes (Signed)
PROGRESS NOTE    SHIVALI QUACKENBUSH  TAV:[id number] DOB: [birth year] DOA: 04/16/2021 PCP: Denita Lung, MD   Brief Narrative:  This 61 yrs old Morbidly obese female with PMH significant for depression, chronic anemia, GERD, and HLD who presented to the ED with c/o: generalized swelling primarily of the legs and abdomen. ROS was also positive for intermittent dysfunctional vaginal bleeding.  Patient is admitted for acute systolic CHF requiring IV diuresis.  Hospital course complicated by atrial fibrillation requiring amiodarone, Cardiology is consulted, GYN is consulted for  vaginal bleeding, started on Megace and outpatient follow-up recommended.  Her hemoglobin remained stable,  Patient is started on Eliquis.  She will be monitored over the weekend for any recurrent bleeding.  If she remains stable , on Monday she would be considered for TEE guided cardioversion.  Assessment & Plan:   Principal Problem:   Anasarca Active Problems:   Lower urinary tract infectious disease   Morbid obesity ([place])   Vaginal bleeding   Iron deficiency anemia due to chronic blood loss   Cardiomegaly   Chest pain   Atrial flutter (HCC)   CAP (community acquired pneumonia)   Pressure injury of skin  Acute on chronic combined systolic and diastolic  CHF /Anasarca: Patient presented with generalized body swelling more pronounced in the legs and abdomen.   Echocardiogram shows LVEF 45 to 50% with global hypokinesis and mildly dilated PASP.  Cardiology consulted. Continue aggressive diuresis, Net negative balance 31 L since admission. Continue daily weight, intake output charting. Continue IV Lasix 40 mg every 8 hours. Venous duplex negative for DVT.  Intake/Output Summary (Last 24 hours) at 04/23/2021 1233 Last data filed at 04/23/2021 0944 Gross per 24 hour  Intake 720 ml  Output 5050 ml  Net -4330 ml     Atrial Flutter /Ectopic atrial tachycardia EKG showed atrial flutter versus ectopic atrial  tachycardia. This is contributing to the CHF exacerbation. Cardiology recommended to continue metoprolol and amiodarone for rate control. Patient is started on Eliquis, She would be monitored over the weekend for recurrent bleeding. Plan is for TEE guided cardioversion on Monday.  Hypokalemia: Replaced and resolved.  Hypomagnesemia: Replaced and resolved.  Morbid obesity: Diet and exercise discussed in detail  Vaginal bleeding: H&H remained stable. Pelvic ultrasound without acute findings, not able to visualize all structures. No evidence of ongoing bleeding presently. GYN consulted, started patient on Megace and recommended outpatient follow-up. She reports vaginal bleeding has improved.  Iron deficiency anemia: Patient has received IV iron therapy. H / H stable.  UTI: No clear signs and symptoms to suggest  UTI. Continue to monitor off antibiotics.  Pneumonia ruled out.: Normal procalcitonin, normal WBCs, Antibiotic discontinued.   DVT prophylaxis: SCDs Code Status: Full code. Family Communication:  No family at bed side. Disposition Plan:   Admitted for generalized anasarca /  CHF exacerbation requiring IV diuresis. Hospital course complicated by atrial fibrillation, continuous dysfunctional uterine bleeding. Patient is started on Megace for dysfunctional uterine bleeding, Patient started on Eliquis for A. Fib.   Plan is for TEE guided cardioversion on Monday   Consultants:  Cardiology  Procedures: Echocardiogram. Antimicrobials:   Anti-infectives (From admission, onward)    Start     Dose/Rate Route Frequency Ordered Stop   04/17/21 2100  cefTRIAXone (ROCEPHIN) 1 g in sodium chloride 0.9 % 100 mL IVPB  Status:  Discontinued        1 g 200 mL/hr over 30 Minutes Intravenous Every 24 hours 04/16/21 2129 04/17/21  1821   04/17/21 0145  azithromycin (ZITHROMAX) 500 mg in sodium chloride 0.9 % 250 mL IVPB  Status:  Discontinued        500 mg 250 mL/hr over 60  Minutes Intravenous Every 24 hours 04/17/21 0132 04/17/21 1821   04/16/21 2045  cefTRIAXone (ROCEPHIN) 1 g in sodium chloride 0.9 % 100 mL IVPB        1 g 200 mL/hr over 30 Minutes Intravenous  Once 04/16/21 2042 04/16/21 2130       Subjective: Patient was seen and examined at bedside.  Overnight events noted.   She reports feeling much improved.  Still appears volume overloaded. She reports vaginal bleeding has improved.  Objective: Vitals:   04/22/21 1927 04/22/21 2118 04/22/21 2126 04/23/21 0401  BP: 105/69 108/73 108/73 105/61  Pulse: 91 95 95 81  Resp: _0 Temp: 98.1 F (36.7 C)  99 F (37.2 C) 98 F (36.7 C)  TempSrc: Oral  Oral Oral  SpO2: 99%  97% 98%  Weight:      Height:        Intake/Output Summary (Last 24 hours) at 04/23/2021 1233 Last data filed at 04/23/2021 0944 Gross per 24 hour  Intake 720 ml  Output 5050 ml  Net -4330 ml   Filed Weights   04/16/21 1509 04/21/21 0520  Weight: (!) 158.8 kg (!) 160.3 kg    Examination:  General exam: Morbidly obese, deconditioned, not in any acute distress, comfortable Respiratory system: Decreased breath sounds bilaterally, no wheezing, no crackles. Cardiovascular system: S1-S2 heard, Irregular rhythm , no murmur. Gastrointestinal system: Abdomen is soft, mildly distended, nontender, BS+ Central nervous system: Alert and oriented x 3 . No focal neurological deficits. Extremities: Bilateral pedal edema+, no cyanosis, no clubbing. Skin: No rashes, lesions or ulcers. Psychiatry: Judgement and insight appear normal. Mood & affect appropriate.     Data Reviewed: I have personally reviewed following labs and imaging studies  CBC: Recent Labs  Lab 04/16/21 1721 04/17/21 0525 04/18/21 0553 04/19/21 0531 04/21/21 0505 04/22/21 0542  WBC 5.1 4.7 5.4 4.8 5.7 6.1  NEUTROABS 3.0 2.7  --   --   --   --   HGB 10.9* 10.4* 10.4* 10.5* 10.9* 11.3*  HCT 37.5 35.7* 36.3 35.9* 37.7 38.7  MCV 77.2* 77.3* 77.7* 76.2*  77.3* 77.1*  PLT 315 323 323 332 345 773   Basic Metabolic Panel: Recent Labs  Lab 04/17/21 0525 04/18/21 0553 04/19/21 0531 04/20/21 0433 04/21/21 0505 04/22/21 0542 04/23/21 0529  NA 140 140 138 136 136 136 137  K 3.7 3.6 3.4* 4.1 4.5 4.1 4.4  CL 107 102 99 98 97* 97* 98  CO2 27 29 32 34* 32 30 28  GLUCOSE 96 99 88 90 90 95 115*  BUN _1 23*  CREATININE 0.71 0.79 0.78 0.81 0.78 0.82 0.97  CALCIUM 8.6* 9.0 9.0 9.0 9.1 8.9 9.3  MG 1.8 1.8 1.8 2.0  --   --   --   PHOS 4.2  --   --   --   --   --   --    GFR: Estimated Creatinine Clearance: 103.6 mL/min (by C-G formula based on SCr of 0.97 mg/dL). Liver Function Tests: Recent Labs  Lab 04/16/21 1721 04/17/21 0525 04/18/21 0553  AST _2 ALT _3 ALKPHOS 87 77 77  BILITOT 1.8* 1.3* 1.2  PROT 7.5 7.0 7.3  ALBUMIN 3.5  3.3* 3.2*   No results for input(s): LIPASE, AMYLASE in the last 168 hours. No results for input(s): AMMONIA in the last 168 hours. Coagulation Profile: No results for input(s): INR, PROTIME in the last 168 hours. Cardiac Enzymes: No results for input(s): CKTOTAL, CKMB, CKMBINDEX, TROPONINI in the last 168 hours. BNP (last 3 results) No results for input(s): PROBNP in the last 8760 hours. HbA1C: No results for input(s): HGBA1C in the last 72 hours. CBG: No results for input(s): GLUCAP in the last 168 hours. Lipid Profile: No results for input(s): CHOL, HDL, LDLCALC, TRIG, CHOLHDL, LDLDIRECT in the last 72 hours. Thyroid Function Tests: No results for input(s): TSH, T4TOTAL, FREET4, T3FREE, THYROIDAB in the last 72 hours. Anemia Panel: No results for input(s): VITAMINB12, FOLATE, FERRITIN, TIBC, IRON, RETICCTPCT in the last 72 hours. Sepsis Labs: Recent Labs  Lab 04/17/21 0525 04/18/21 0553  PROCALCITON <0.10 <0.10    Recent Results (from the past 240 hour(s))  Resp Panel by RT-PCR (Flu A&B, Covid) Nasopharyngeal Swab     Status: None   Collection Time: 04/16/21  5:20  PM   Specimen: Nasopharyngeal Swab; Nasopharyngeal(NP) swabs in vial transport medium  Result Value Ref Range Status   SARS Coronavirus 2 by RT PCR NEGATIVE NEGATIVE Final    Comment: (NOTE) SARS-CoV-2 target nucleic acids are NOT DETECTED.  The SARS-CoV-2 RNA is generally detectable in upper respiratory specimens during the acute phase of infection. The lowest concentration of SARS-CoV-2 viral copies this assay can detect is 138 copies/mL. A negative result does not preclude SARS-Cov-2 infection and should not be used as the sole basis for treatment or other patient management decisions. A negative result may occur with  improper specimen collection/handling, submission of specimen other than nasopharyngeal swab, presence of viral mutation(s) within the areas targeted by this assay, and inadequate number of viral copies(<138 copies/mL). A negative result must be combined with clinical observations, patient history, and epidemiological information. The expected result is Negative.  Fact Sheet for Patients:  EntrepreneurPulse.com.au  Fact Sheet for Healthcare Providers:  IncredibleEmployment.be  This test is no t yet approved or cleared by the [place] FDA and  has been authorized for detection and/or diagnosis of SARS-CoV-2 by FDA under an Emergency Use Authorization (EUA). This EUA will remain  in effect (meaning this test can be used) for the duration of the COVID-19 declaration under Section 564(b)(1) of the Act, 21 U.S.C.section 360bbb-3(b)(1), unless the authorization is terminated  or revoked sooner.       Influenza A by PCR NEGATIVE NEGATIVE Final   Influenza B by PCR NEGATIVE NEGATIVE Final    Comment: (NOTE) The Xpert Xpress SARS-CoV-2/FLU/RSV plus assay is intended as an aid in the diagnosis of influenza from Nasopharyngeal swab specimens and should not be used as a sole basis for treatment. Nasal washings and aspirates are  unacceptable for Xpert Xpress SARS-CoV-2/FLU/RSV testing.  Fact Sheet for Patients: EntrepreneurPulse.com.au  Fact Sheet for Healthcare Providers: IncredibleEmployment.be  This test is not yet approved or cleared by the [place] FDA and has been authorized for detection and/or diagnosis of SARS-CoV-2 by FDA under an Emergency Use Authorization (EUA). This EUA will remain in effect (meaning this test can be used) for the duration of the COVID-19 declaration under Section 564(b)(1) of the Act, 21 U.S.C. section 360bbb-3(b)(1), unless the authorization is terminated or revoked.  Performed at [facility], [place] [address], [place], [place] [address]     Radiology Studies: No results found.  Scheduled Meds:  (feeding supplement) PROSource Plus  30 mL Oral BID BM   amiodarone  400 mg Oral BID   apixaban  5 mg Oral BID   ferrous sulfate  325 mg Oral TID WC   furosemide  40 mg Intravenous BID   megestrol  40 mg Oral BID   metoprolol tartrate  50 mg Oral BID   pantoprazole  40 mg Oral Daily   potassium chloride  20 mEq Oral TID   Ensure Max Protein  11 oz Oral Daily   sodium chloride flush  3 mL Intravenous Q12H   Continuous Infusions:  sodium chloride       LOS: 6 days    Time spent: 35 min    Gavyn Ybarra, MD [facility]   If 7PM-7AM, please contact night-coverage

## 2021-04-24 LAB — BASIC METABOLIC PANEL
Anion gap: 9 (ref 5–15)
BUN: 22 mg/dL — ABNORMAL HIGH (ref 6–20)
CO2: 28 mmol/L (ref 22–32)
Calcium: 9.2 mg/dL (ref 8.9–10.3)
Chloride: 98 mmol/L (ref 98–111)
Creatinine, Ser: 0.98 mg/dL (ref 0.44–1.00)
GFR, Estimated: >60 mL/min (ref >60)
Glucose, Bld: 144 mg/dL — ABNORMAL HIGH (ref 70–99)
Potassium: 4.1 mmol/L (ref 3.5–5.1)
Sodium: 135 mmol/L (ref 135–145)

## 2021-04-24 LAB — CBC
HCT: 40.2 % (ref 36.0–46.0)
Hemoglobin: 11.7 g/dL — ABNORMAL LOW (ref 12.0–15.0)
MCH: 22.5 pg — ABNORMAL LOW (ref 26.0–34.0)
MCHC: 29.1 g/dL — ABNORMAL LOW (ref 30.0–36.0)
MCV: 77.5 fL — ABNORMAL LOW (ref 80.0–100.0)
Platelets: 414 10*3/uL — ABNORMAL HIGH (ref 150–400)
RBC: 5.19 MIL/uL — ABNORMAL HIGH (ref 3.87–5.11)
RDW: 22.8 % — ABNORMAL HIGH (ref 11.5–15.5)
WBC: 7.1 10*3/uL (ref 4.0–10.5)
nRBC: 0 % (ref 0.0–0.2)

## 2021-04-24 NOTE — Progress Notes (Signed)
PROGRESS NOTE    Amber Herring  UXN:[id number] DOB: [birth year] DOA: 04/16/2021 PCP: Denita Lung, MD   Brief Narrative:  This 61 yrs old Morbidly obese female with PMH significant for depression, chronic anemia, GERD, and HLD who presented to the ED with c/o: generalized swelling primarily of the legs and abdomen. ROS was also positive for intermittent dysfunctional vaginal bleeding.  Patient is admitted for acute systolic CHF requiring IV diuresis.  Hospital course complicated by atrial fibrillation requiring amiodarone, Cardiology is consulted, GYN is consulted for  vaginal bleeding, started on Megace and outpatient follow-up recommended.  Her hemoglobin remained stable,  Patient is started on Eliquis.  She will be monitored over the weekend for any recurrent bleeding.  If she remains stable , on Monday she would be considered for TEE guided cardioversion.  Assessment & Plan:   Principal Problem:   Anasarca Active Problems:   Lower urinary tract infectious disease   Morbid obesity ([place])   Vaginal bleeding   Iron deficiency anemia due to chronic blood loss   Cardiomegaly   Chest pain   Atrial flutter (HCC)   CAP (community acquired pneumonia)   Pressure injury of skin  Acute on chronic combined systolic and diastolic  CHF /Anasarca: Patient presented with generalized body swelling more pronounced in the legs and abdomen.   Echocardiogram shows LVEF 45 to 50% with global hypokinesis and mildly dilated PASP.  Cardiology consulted. Continue aggressive diuresis, Net negative balance 31.5 L since admission. Continue daily weight, intake output charting. Continue IV Lasix 40 mg every 8 hours. Venous duplex negative for DVT.  Intake/Output Summary (Last 24 hours) at 04/24/2021 1154 Last data filed at 04/24/2021 0900 Gross per 24 hour  Intake 483 ml  Output 1000 ml  Net -517 ml    Atrial Flutter /Ectopic atrial tachycardia EKG showed atrial flutter versus ectopic atrial  tachycardia. This is contributing to the CHF exacerbation. Cardiology recommended to continue metoprolol and amiodarone for rate control. Patient is started on Eliquis, She would be monitored over the weekend for recurrent bleeding. Plan is for TEE guided cardioversion on Monday.  Hypokalemia: Replaced and resolved.  Hypomagnesemia: Replaced and resolved.  Morbid obesity: Diet and exercise discussed in detail  Vaginal bleeding: H&H remained stable. Pelvic ultrasound without acute findings, not able to visualize all structures. No evidence of ongoing bleeding presently. GYN consulted, started patient on Megace and recommended outpatient follow-up. She reports vaginal bleeding has improved.  Iron deficiency anemia: Patient has received IV iron therapy. H / H stable.  UTI: No clear signs and symptoms to suggest  UTI. Continue to monitor off antibiotics.  Pneumonia ruled out.: Normal procalcitonin, normal WBCs, Antibiotic discontinued.  DVT prophylaxis: SCDs Code Status: Full code. Family Communication:  No family at bed side. Disposition Plan:   Admitted for generalized anasarca /  CHF exacerbation requiring IV diuresis. Hospital course complicated by atrial fibrillation, continuous dysfunctional uterine bleeding. Patient is started on Megace for dysfunctional uterine bleeding, Patient started on Eliquis for A. Fib.   Plan is for TEE guided cardioversion on Monday   Consultants:  Cardiology  Procedures: Echocardiogram. Antimicrobials:   Anti-infectives (From admission, onward)    Start     Dose/Rate Route Frequency Ordered Stop   04/17/21 2100  cefTRIAXone (ROCEPHIN) 1 g in sodium chloride 0.9 % 100 mL IVPB  Status:  Discontinued        1 g 200 mL/hr over 30 Minutes Intravenous Every 24 hours 04/16/21 2129 04/17/21 1821  04/17/21 0145  azithromycin (ZITHROMAX) 500 mg in sodium chloride 0.9 % 250 mL IVPB  Status:  Discontinued        500 mg 250 mL/hr over 60  Minutes Intravenous Every 24 hours 04/17/21 0132 04/17/21 1821   04/16/21 2045  cefTRIAXone (ROCEPHIN) 1 g in sodium chloride 0.9 % 100 mL IVPB        1 g 200 mL/hr over 30 Minutes Intravenous  Once 04/16/21 2042 04/16/21 2130       Subjective: Patient was seen and examined at bedside.  Overnight events noted.   She reports feeling much better.  She denies any further vaginal bleeding. She reports vaginal bleeding has improved. She still appears volume overloaded.  Objective: Vitals:   04/23/21 0401 04/23/21 1232 04/23/21 2005 04/24/21 0444  BP: 105/61 109/68 (!) 100/59 116/88  Pulse: 81 88 91 95  Resp: _0 Temp: 98 F (36.7 C) 98.2 F (36.8 C) 97.7 F (36.5 C) 98.4 F (36.9 C)  TempSrc: Oral Oral Oral   SpO2: 98% 98% 97% 98%  Weight:    (!) 159.7 kg  Height:        Intake/Output Summary (Last 24 hours) at 04/24/2021 1154 Last data filed at 04/24/2021 0900 Gross per 24 hour  Intake 483 ml  Output 1000 ml  Net -517 ml   Filed Weights   04/16/21 1509 04/21/21 0520 04/24/21 0444  Weight: (!) 158.8 kg (!) 160.3 kg (!) 159.7 kg    Examination:  General exam: Morbidly obese, deconditioned, not in any acute distress.  Comfortable Respiratory system: Clear to auscultation bilaterally, no wheezing, no crackles. Cardiovascular system: S1-S2 heard, Irregular rhythm , rate controlled,  no murmur. Gastrointestinal system: Abdomen is soft, mildly distended, nontender, BS+ Central nervous system: Alert and oriented x 3 . No focal neurological deficits. Extremities: Bilateral pedal edema+, no cyanosis, no clubbing. Skin: No rashes, lesions or ulcers. Psychiatry: Judgement and insight appear normal. Mood & affect appropriate.     Data Reviewed: I have personally reviewed following labs and imaging studies  CBC: Recent Labs  Lab 04/18/21 0553 04/19/21 0531 04/21/21 0505 04/22/21 0542 04/24/21 0554  WBC 5.4 4.8 5.7 6.1 7.1  HGB 10.4* 10.5* 10.9* 11.3* 11.7*  HCT  36.3 35.9* 37.7 38.7 40.2  MCV 77.7* 76.2* 77.3* 77.1* 77.5*  PLT 323 332 345 385 953*   Basic Metabolic Panel: Recent Labs  Lab 04/18/21 0553 04/19/21 0531 04/20/21 0433 04/21/21 0505 04/22/21 0542 04/23/21 0529 04/24/21 0554  NA 140 138 136 136 136 137 135  K 3.6 3.4* 4.1 4.5 4.1 4.4 4.1  CL 102 99 98 97* 97* 98 98  CO2 29 32 34* 32 _1 GLUCOSE 99 88 90 90 95 115* 144*  BUN _2 23* 22*  CREATININE 0.79 0.78 0.81 0.78 0.82 0.97 0.98  CALCIUM 9.0 9.0 9.0 9.1 8.9 9.3 9.2  MG 1.8 1.8 2.0  --   --   --   --    GFR: Estimated Creatinine Clearance: 102.3 mL/min (by C-G formula based on SCr of 0.98 mg/dL). Liver Function Tests: Recent Labs  Lab 04/18/21 0553  AST 23  ALT 16  ALKPHOS 77  BILITOT 1.2  PROT 7.3  ALBUMIN 3.2*   No results for input(s): LIPASE, AMYLASE in the last 168 hours. No results for input(s): AMMONIA in the last 168 hours. Coagulation Profile: No results for input(s): INR, PROTIME in the last 168 hours. Cardiac  Enzymes: No results for input(s): CKTOTAL, CKMB, CKMBINDEX, TROPONINI in the last 168 hours. BNP (last 3 results) No results for input(s): PROBNP in the last 8760 hours. HbA1C: No results for input(s): HGBA1C in the last 72 hours. CBG: No results for input(s): GLUCAP in the last 168 hours. Lipid Profile: No results for input(s): CHOL, HDL, LDLCALC, TRIG, CHOLHDL, LDLDIRECT in the last 72 hours. Thyroid Function Tests: No results for input(s): TSH, T4TOTAL, FREET4, T3FREE, THYROIDAB in the last 72 hours. Anemia Panel: No results for input(s): VITAMINB12, FOLATE, FERRITIN, TIBC, IRON, RETICCTPCT in the last 72 hours. Sepsis Labs: Recent Labs  Lab 04/18/21 0553  PROCALCITON <0.10    Recent Results (from the past 240 hour(s))  Resp Panel by RT-PCR (Flu A&B, Covid) Nasopharyngeal Swab     Status: None   Collection Time: 04/16/21  5:20 PM   Specimen: Nasopharyngeal Swab; Nasopharyngeal(NP) swabs in vial transport medium   Result Value Ref Range Status   SARS Coronavirus 2 by RT PCR NEGATIVE NEGATIVE Final    Comment: (NOTE) SARS-CoV-2 target nucleic acids are NOT DETECTED.  The SARS-CoV-2 RNA is generally detectable in upper respiratory specimens during the acute phase of infection. The lowest concentration of SARS-CoV-2 viral copies this assay can detect is 138 copies/mL. A negative result does not preclude SARS-Cov-2 infection and should not be used as the sole basis for treatment or other patient management decisions. A negative result may occur with  improper specimen collection/handling, submission of specimen other than nasopharyngeal swab, presence of viral mutation(s) within the areas targeted by this assay, and inadequate number of viral copies(<138 copies/mL). A negative result must be combined with clinical observations, patient history, and epidemiological information. The expected result is Negative.  Fact Sheet for Patients:  EntrepreneurPulse.com.au  Fact Sheet for Healthcare Providers:  IncredibleEmployment.be  This test is no t yet approved or cleared by the [place] FDA and  has been authorized for detection and/or diagnosis of SARS-CoV-2 by FDA under an Emergency Use Authorization (EUA). This EUA will remain  in effect (meaning this test can be used) for the duration of the COVID-19 declaration under Section 564(b)(1) of the Act, 21 U.S.C.section 360bbb-3(b)(1), unless the authorization is terminated  or revoked sooner.       Influenza A by PCR NEGATIVE NEGATIVE Final   Influenza B by PCR NEGATIVE NEGATIVE Final    Comment: (NOTE) The Xpert Xpress SARS-CoV-2/FLU/RSV plus assay is intended as an aid in the diagnosis of influenza from Nasopharyngeal swab specimens and should not be used as a sole basis for treatment. Nasal washings and aspirates are unacceptable for Xpert Xpress SARS-CoV-2/FLU/RSV testing.  Fact Sheet for  Patients: EntrepreneurPulse.com.au  Fact Sheet for Healthcare Providers: IncredibleEmployment.be  This test is not yet approved or cleared by the [place] FDA and has been authorized for detection and/or diagnosis of SARS-CoV-2 by FDA under an Emergency Use Authorization (EUA). This EUA will remain in effect (meaning this test can be used) for the duration of the COVID-19 declaration under Section 564(b)(1) of the Act, 21 U.S.C. section 360bbb-3(b)(1), unless the authorization is terminated or revoked.  Performed at [facility], [place] [address] [place], [place] [address]     Radiology Studies: No results found.  Scheduled Meds:  (feeding supplement) PROSource Plus  30 mL Oral BID BM   amiodarone  400 mg Oral BID   apixaban  5 mg Oral BID   ferrous sulfate  325 mg Oral TID WC   furosemide  40 mg Intravenous BID   megestrol  40 mg Oral BID   metoprolol tartrate  50 mg Oral BID   pantoprazole  40 mg Oral Daily   potassium chloride  20 mEq Oral TID   Ensure Max Protein  11 oz Oral Daily   sodium chloride flush  3 mL Intravenous Q12H   Continuous Infusions:  sodium chloride       LOS: 7 days    Time spent: 35 min    Jeremaine Maraj, MD [facility]   If 7PM-7AM, please contact night-coverage

## 2021-04-24 NOTE — Progress Notes (Signed)
Progress Note  Patient Name: Amber Herring Date of Encounter: 04/24/2021  [facility] Cardiologist: Pixie Casino, MD   Subjective   No CP or dyspnea this AM  Inpatient Medications    Scheduled Meds:  (feeding supplement) PROSource Plus  30 mL Oral BID BM   amiodarone  400 mg Oral BID   apixaban  5 mg Oral BID   ferrous sulfate  325 mg Oral TID WC   furosemide  40 mg Intravenous BID   megestrol  40 mg Oral BID   metoprolol tartrate  50 mg Oral BID   pantoprazole  40 mg Oral Daily   potassium chloride  20 mEq Oral TID   Ensure Max Protein  11 oz Oral Daily   sodium chloride flush  3 mL Intravenous Q12H   Continuous Infusions:  sodium chloride     PRN Meds: sodium chloride, acetaminophen **OR** [DISCONTINUED] acetaminophen, alum & mag hydroxide-simeth, oxyCODONE, sodium chloride flush, traMADol   Vital Signs    Vitals:   04/23/21 0401 04/23/21 1232 04/23/21 2005 04/24/21 0444  BP: 105/61 109/68 (!) 100/59 116/88  Pulse: 81 88 91 95  Resp: _0 Temp: 98 F (36.7 C) 98.2 F (36.8 C) 97.7 F (36.5 C) 98.4 F (36.9 C)  TempSrc: Oral Oral Oral   SpO2: 98% 98% 97% 98%  Weight:    (!) 159.7 kg  Height:        Intake/Output Summary (Last 24 hours) at 04/24/2021 1239 Last data filed at 04/24/2021 0300 Gross per 24 hour  Intake 3 ml  Output 2100 ml  Net -2097 ml    Last 3 Weights 04/24/2021 04/21/2021 04/16/2021  Weight (lbs) 352 lb 1.2 oz 353 lb 6.4 oz 350 lb  Weight (kg) 159.7 kg 160.3 kg 158.759 kg      Telemetry    Atrial flutter rate controlled - Personally Reviewed    Physical Exam   GEN: NAD Neck: supple Cardiac: irregular, no murmur Respiratory: CTA; no wheeze GI: Soft, NT/ND, abdominal wall edema, obese MS: 1+ edema Neuro:  Grossly intact Psych: Normal affect   Labs    High Sensitivity Troponin:   Recent Labs  Lab 04/16/21 1721 04/16/21 1827  TROPONINIHS 13 12      Chemistry Recent Labs  Lab 04/18/21 0553  04/19/21 0531 04/20/21 0433 04/21/21 0505 04/22/21 0542 04/23/21 0529  NA 140 138 136 136 136 137  K 3.6 3.4* 4.1 4.5 4.1 4.4  CL 102 99 98 97* 97* 98  CO2 29 32 34* 32 30 28  GLUCOSE 99 88 90 90 95 115*  BUN _1 23*  CREATININE 0.79 0.78 0.81 0.78 0.82 0.97  CALCIUM 9.0 9.0 9.0 9.1 8.9 9.3  MG 1.8 1.8 2.0  --   --   --   PROT 7.3  --   --   --   --   --   ALBUMIN 3.2*  --   --   --   --   --   AST 23  --   --   --   --   --   ALT 16  --   --   --   --   --   ALKPHOS 77  --   --   --   --   --   BILITOT 1.2  --   --   --   --   --   GFRNONAA >60 >60 >  60 >60 >60 >60  ANIONGAP 9 7 4* _0 Hematology Recent Labs  Lab 04/21/21 0505 04/22/21 0542 04/24/21 0554  WBC 5.7 6.1 7.1  RBC 4.88 5.02 5.19*  HGB 10.9* 11.3* 11.7*  HCT 37.7 38.7 40.2  MCV 77.3* 77.1* 77.5*  MCH 22.3* 22.5* 22.5*  MCHC 28.9* 29.2* 29.1*  RDW 21.2* 21.5* 22.8*  PLT 345 385 414*       Cardiac Studies   Echo 04/17/21   1. Left ventricular ejection fraction, by estimation, is 45 to 50%. Left  ventricular ejection fraction by 2D MOD biplane is 46.2 %. The left  ventricle has mildly decreased function. The left ventricle demonstrates  global hypokinesis. Indeterminate  diastolic filling due to E-A fusion.   2. Right ventricular systolic function is normal. The right ventricular  size is moderately enlarged. There is mildly elevated pulmonary artery  systolic pressure. The estimated right ventricular systolic pressure is  71.2 mmHg.   3. Right atrial size was severely dilated.   4. The mitral valve is abnormal. Mild mitral valve regurgitation.   5. Tricuspid valve regurgitation is mild to moderate.   6. The aortic valve is tricuspid. Aortic valve regurgitation is not  visualized.   7. The inferior vena cava is dilated in size with <50% respiratory  variability, suggesting right atrial pressure of 15 mmHg.   Comparison(s): No prior Echocardiogram.   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 She was initially tachycardic felt due to sinus tach. Found to have CHF with EF 45-50% without prior to compare to.   Assessment & Plan    1 acute on chronic combined systolic/diastolic congestive heart failure--plan to continue present dose of Lasix.  Be met pending this morning.  Volume status overall is improving.  As outlined previously have not added Farxiga due to obesity/risk of fungal infection.    2 atrial flutter-telemetry reviewed and patient remains in atrial flutter versus ectopic atrial tachycardia.  Rate is controlled.  Continue metoprolol and amiodarone.  Continue apixaban.  Plan is to proceed with TEE guided cardioversion on Monday.  She will follow-up with electrophysiology after discharge for consideration of ablation.  3 morbid obesity-we will need evaluation for obstructive sleep apnea as an outpatient.  She is noted to have right atrial enlargement/tricuspid regurgitation on her echocardiogram likely related to pulmonary hypertension from sleep apnea and obesity hypoventilation syndrome.  4 vaginal bleeding-GYN has evaluated and plans outpatient follow-up.  Megace added.  Hemoglobin unchanged yesterday.  We will continue to follow.  5 microcytic anemia-continue to follow given initiation of apixaban.  For questions or updates, please contact [place] [place] Please consult [email or web address] for contact info under        Signed, Kirk Ruths, MD  04/24/2021, 6:20 AM

## 2021-04-24 NOTE — Progress Notes (Signed)
Tele report HR 93 1st degree heart block.

## 2021-04-25 LAB — BASIC METABOLIC PANEL
Anion gap: 8 (ref 5–15)
BUN: 23 mg/dL — ABNORMAL HIGH (ref 6–20)
CO2: 28 mmol/L (ref 22–32)
Calcium: 9.3 mg/dL (ref 8.9–10.3)
Chloride: 100 mmol/L (ref 98–111)
Creatinine, Ser: 0.92 mg/dL (ref 0.44–1.00)
GFR, Estimated: >60 mL/min (ref >60)
Glucose, Bld: 92 mg/dL (ref 70–99)
Potassium: 4.3 mmol/L (ref 3.5–5.1)
Sodium: 136 mmol/L (ref 135–145)

## 2021-04-25 MED ORDER — AMIODARONE HCL 200 MG PO TABS
200.0000 mg | ORAL_TABLET | Freq: Every day | ORAL | Status: DC
  Administered 2021-04-25 – 2021-05-06 (×12): 200 mg via ORAL
  Filled 2021-04-25 (×14): qty 1

## 2021-04-25 MED ORDER — INFLUENZA VAC SPLIT QUAD 0.5 ML IM SUSY
0.5000 mL | PREFILLED_SYRINGE | INTRAMUSCULAR | Status: AC
  Administered 2021-04-30: 0.5 mL via INTRAMUSCULAR

## 2021-04-25 MED ORDER — SODIUM CHLORIDE 0.9 % IV SOLN: INTRAVENOUS | Status: DC

## 2021-04-25 NOTE — Progress Notes (Signed)
PROGRESS NOTE    Amber Herring  BJS:[id number] DOB: [birth year] DOA: 04/16/2021 PCP: Denita Lung, MD   Brief Narrative:  This 61 yrs old Morbidly obese female with PMH significant for depression, chronic anemia, GERD, and HLD who presented to the ED with c/o: generalized swelling primarily of the legs and abdomen. ROS was also positive for intermittent dysfunctional vaginal bleeding.  Patient is admitted for acute systolic CHF requiring IV diuresis.  Hospital course complicated by atrial fibrillation requiring amiodarone, Cardiology is consulted, GYN is consulted for  vaginal bleeding, started on Megace and outpatient follow-up recommended.  Her hemoglobin remained stable,  Patient is started on Eliquis.  She will be monitored over the weekend for any recurrent bleeding.  If she remains stable , on Monday she would be considered for TEE guided cardioversion.  Assessment & Plan:   Principal Problem:   Anasarca Active Problems:   Lower urinary tract infectious disease   Morbid obesity ([place])   Vaginal bleeding   Iron deficiency anemia due to chronic blood loss   Cardiomegaly   Chest pain   Atrial flutter (HCC)   CAP (community acquired pneumonia)   Pressure injury of skin  Acute on chronic combined systolic and diastolic  CHF /Anasarca: Patient presented with generalized body swelling more pronounced in the legs and abdomen.   Echocardiogram shows LVEF 45 to 50% with global hypokinesis and mildly dilated PASP.  Cardiology consulted. Continue aggressive diuresis, Net negative balance 32.8 L since admission. Continue daily weight, intake output charting. Continue IV Lasix 40 mg every 8 hours. Venous duplex negative for DVT.  Intake/Output Summary (Last 24 hours) at 04/25/2021 1116 Last data filed at 04/25/2021 1017 Gross per 24 hour  Intake 936 ml  Output 1500 ml  Net -564 ml    Atrial Flutter /Ectopic atrial tachycardia EKG showed atrial flutter versus ectopic atrial  tachycardia. This is contributing to the CHF exacerbation. Cardiology recommended to continue metoprolol and amiodarone for rate control. Patient is started on Eliquis, She would be monitored over the weekend for recurrent bleeding. Plan is for TEE guided cardioversion on Monday.  Hypokalemia: Replaced and resolved.  Hypomagnesemia: Replaced and resolved.  Morbid obesity: Diet and exercise discussed in detail  Vaginal bleeding: H&H remained stable. Pelvic ultrasound without acute findings, not able to visualize all structures. No evidence of ongoing bleeding presently. GYN consulted, started patient on Megace and recommended outpatient follow-up. She reports vaginal bleeding has improved.  Iron deficiency anemia: Patient has received IV iron therapy. H / H stable.  UTI: No clear signs and symptoms to suggest  UTI. Continue to monitor off antibiotics.  Pneumonia ruled out.: Normal procalcitonin, normal WBCs, Antibiotic discontinued.  DVT prophylaxis: SCDs Code Status: Full code. Family Communication:  No family at bed side. Disposition Plan:   Admitted for generalized anasarca /  CHF exacerbation requiring IV diuresis. Hospital course complicated by atrial fibrillation, continuous dysfunctional uterine bleeding. Patient is started on Megace for dysfunctional uterine bleeding, DUB resolved. Patient started on Eliquis for A. Fib.   Plan is for TEE guided cardioversion on Monday.  Consultants:  Cardiology  Procedures: Echocardiogram. Antimicrobials:   Anti-infectives (From admission, onward)    Start     Dose/Rate Route Frequency Ordered Stop   04/17/21 2100  cefTRIAXone (ROCEPHIN) 1 g in sodium chloride 0.9 % 100 mL IVPB  Status:  Discontinued        1 g 200 mL/hr over 30 Minutes Intravenous Every 24 hours 04/16/21 2129 04/17/21 1821  04/17/21 0145  azithromycin (ZITHROMAX) 500 mg in sodium chloride 0.9 % 250 mL IVPB  Status:  Discontinued        500 mg 250  mL/hr over 60 Minutes Intravenous Every 24 hours 04/17/21 0132 04/17/21 1821   04/16/21 2045  cefTRIAXone (ROCEPHIN) 1 g in sodium chloride 0.9 % 100 mL IVPB        1 g 200 mL/hr over 30 Minutes Intravenous  Once 04/16/21 2042 04/16/21 2130       Subjective: Patient was seen and examined at bedside. Overnight events noted.   She reports vaginal bleeding has improved. She still appears volume overloaded. She reports having bilateral knee pain,  asked for pain medication.  Objective: Vitals:   04/24/21 0444 04/24/21 1357 04/24/21 1957 04/25/21 0335  BP: 116/88 113/68 109/79 107/74  Pulse: 95 92 94 92  Resp: _0 Temp: 98.4 F (36.9 C) 98.9 F (37.2 C) (!) 97.4 F (36.3 C) 98 F (36.7 C)  TempSrc:  Oral Oral Oral  SpO2: 98% 97% 100% 97%  Weight: (!) 159.7 kg     Height:        Intake/Output Summary (Last 24 hours) at 04/25/2021 1116 Last data filed at 04/25/2021 1017 Gross per 24 hour  Intake 936 ml  Output 1500 ml  Net -564 ml   Filed Weights   04/16/21 1509 04/21/21 0520 04/24/21 0444  Weight: (!) 158.8 kg (!) 160.3 kg (!) 159.7 kg    Examination:  General exam: Appears comfortable, deconditioned, morbidly obese, not in any distress. Respiratory system: Clear to auscultation bilaterally, no wheezing, no crackles. Cardiovascular system: S1-S2 heard, Irregular rhythm , rate controlled,  no murmur. Gastrointestinal system: Abdomen is soft, mildly distended, nontender, BS+ Central nervous system: Alert and oriented x 3 . No focal neurological deficits. Extremities: Bilateral pedal edema+, no cyanosis, no clubbing. Skin: No rashes, lesions or ulcers. Psychiatry: Judgement and insight appear normal. Mood & affect appropriate.     Data Reviewed: I have personally reviewed following labs and imaging studies  CBC: Recent Labs  Lab 04/19/21 0531 04/21/21 0505 04/22/21 0542 04/24/21 0554  WBC 4.8 5.7 6.1 7.1  HGB 10.5* 10.9* 11.3* 11.7*  HCT 35.9* 37.7 38.7  40.2  MCV 76.2* 77.3* 77.1* 77.5*  PLT 332 345 385 048*   Basic Metabolic Panel: Recent Labs  Lab 04/19/21 0531 04/20/21 0433 04/21/21 0505 04/22/21 0542 04/23/21 0529 04/24/21 0554 04/25/21 0547  NA 138 136 136 136 137 135 136  K 3.4* 4.1 4.5 4.1 4.4 4.1 4.3  CL 99 98 97* 97* 98 98 100  CO2 32 34* 32 _1 GLUCOSE 88 90 90 95 115* 144* 92  BUN _2 23* 22* 23*  CREATININE 0.78 0.81 0.78 0.82 0.97 0.98 0.92  CALCIUM 9.0 9.0 9.1 8.9 9.3 9.2 9.3  MG 1.8 2.0  --   --   --   --   --    GFR: Estimated Creatinine Clearance: 109 mL/min (by C-G formula based on SCr of 0.92 mg/dL). Liver Function Tests: No results for input(s): AST, ALT, ALKPHOS, BILITOT, PROT, ALBUMIN in the last 168 hours.  No results for input(s): LIPASE, AMYLASE in the last 168 hours. No results for input(s): AMMONIA in the last 168 hours. Coagulation Profile: No results for input(s): INR, PROTIME in the last 168 hours. Cardiac Enzymes: No results for input(s): CKTOTAL, CKMB, CKMBINDEX, TROPONINI in the last 168 hours. BNP (last 3 results)  No results for input(s): PROBNP in the last 8760 hours. HbA1C: No results for input(s): HGBA1C in the last 72 hours. CBG: No results for input(s): GLUCAP in the last 168 hours. Lipid Profile: No results for input(s): CHOL, HDL, LDLCALC, TRIG, CHOLHDL, LDLDIRECT in the last 72 hours. Thyroid Function Tests: No results for input(s): TSH, T4TOTAL, FREET4, T3FREE, THYROIDAB in the last 72 hours. Anemia Panel: No results for input(s): VITAMINB12, FOLATE, FERRITIN, TIBC, IRON, RETICCTPCT in the last 72 hours. Sepsis Labs: No results for input(s): PROCALCITON, LATICACIDVEN in the last 168 hours.   Recent Results (from the past 240 hour(s))  Resp Panel by RT-PCR (Flu A&B, Covid) Nasopharyngeal Swab     Status: None   Collection Time: 04/16/21  5:20 PM   Specimen: Nasopharyngeal Swab; Nasopharyngeal(NP) swabs in vial transport medium  Result Value Ref Range  Status   SARS Coronavirus 2 by RT PCR NEGATIVE NEGATIVE Final    Comment: (NOTE) SARS-CoV-2 target nucleic acids are NOT DETECTED.  The SARS-CoV-2 RNA is generally detectable in upper respiratory specimens during the acute phase of infection. The lowest concentration of SARS-CoV-2 viral copies this assay can detect is 138 copies/mL. A negative result does not preclude SARS-Cov-2 infection and should not be used as the sole basis for treatment or other patient management decisions. A negative result may occur with  improper specimen collection/handling, submission of specimen other than nasopharyngeal swab, presence of viral mutation(s) within the areas targeted by this assay, and inadequate number of viral copies(<138 copies/mL). A negative result must be combined with clinical observations, patient history, and epidemiological information. The expected result is Negative.  Fact Sheet for Patients:  EntrepreneurPulse.com.au  Fact Sheet for Healthcare Providers:  IncredibleEmployment.be  This test is no t yet approved or cleared by the [place] FDA and  has been authorized for detection and/or diagnosis of SARS-CoV-2 by FDA under an Emergency Use Authorization (EUA). This EUA will remain  in effect (meaning this test can be used) for the duration of the COVID-19 declaration under Section 564(b)(1) of the Act, 21 U.S.C.section 360bbb-3(b)(1), unless the authorization is terminated  or revoked sooner.       Influenza A by PCR NEGATIVE NEGATIVE Final   Influenza B by PCR NEGATIVE NEGATIVE Final    Comment: (NOTE) The Xpert Xpress SARS-CoV-2/FLU/RSV plus assay is intended as an aid in the diagnosis of influenza from Nasopharyngeal swab specimens and should not be used as a sole basis for treatment. Nasal washings and aspirates are unacceptable for Xpert Xpress SARS-CoV-2/FLU/RSV testing.  Fact Sheet for  Patients: EntrepreneurPulse.com.au  Fact Sheet for Healthcare Providers: IncredibleEmployment.be  This test is not yet approved or cleared by the [place] FDA and has been authorized for detection and/or diagnosis of SARS-CoV-2 by FDA under an Emergency Use Authorization (EUA). This EUA will remain in effect (meaning this test can be used) for the duration of the COVID-19 declaration under Section 564(b)(1) of the Act, 21 U.S.C. section 360bbb-3(b)(1), unless the authorization is terminated or revoked.  Performed at [facility], [place] [address], [place], [place] [address]     Radiology Studies: No results found.  Scheduled Meds:  (feeding supplement) PROSource Plus  30 mL Oral BID BM   amiodarone  200 mg Oral Daily   apixaban  5 mg Oral BID   ferrous sulfate  325 mg Oral TID WC   furosemide  40 mg Intravenous BID   megestrol  40 mg Oral BID   metoprolol tartrate  50 mg Oral BID   pantoprazole  40 mg Oral Daily   potassium chloride  20 mEq Oral TID   Ensure Max Protein  11 oz Oral Daily   sodium chloride flush  3 mL Intravenous Q12H   Continuous Infusions:  sodium chloride       LOS: 8 days    Time spent: 35 min    Lasheka Kempner, MD [facility]   If 7PM-7AM, please contact night-coverage

## 2021-04-25 NOTE — Progress Notes (Signed)
Progress Note  Patient Name: Amber DEDEAUX Date of Encounter: 04/25/2021  [facility] Cardiologist: Pixie Casino, MD   Subjective   Pt denies CP; still feels "full" at times; complains of nausea  Inpatient Medications    Scheduled Meds:  (feeding supplement) PROSource Plus  30 mL Oral BID BM   amiodarone  400 mg Oral BID   apixaban  5 mg Oral BID   ferrous sulfate  325 mg Oral TID WC   furosemide  40 mg Intravenous BID   megestrol  40 mg Oral BID   metoprolol tartrate  50 mg Oral BID   pantoprazole  40 mg Oral Daily   potassium chloride  20 mEq Oral TID   Ensure Max Protein  11 oz Oral Daily   sodium chloride flush  3 mL Intravenous Q12H   Continuous Infusions:  sodium chloride     PRN Meds: sodium chloride, acetaminophen **OR** [DISCONTINUED] acetaminophen, alum & mag hydroxide-simeth, oxyCODONE, sodium chloride flush, traMADol   Vital Signs    Vitals:   04/24/21 0444 04/24/21 1357 04/24/21 1957 04/25/21 0335  BP: 116/88 113/68 109/79 107/74  Pulse: 95 92 94 92  Resp: _0 Temp: 98.4 F (36.9 C) 98.9 F (37.2 C) (!) 97.4 F (36.3 C) 98 F (36.7 C)  TempSrc:  Oral Oral Oral  SpO2: 98% 97% 100% 97%  Weight: (!) 159.7 kg     Height:        Intake/Output Summary (Last 24 hours) at 04/25/2021 0653 Last data filed at 04/24/2021 2030 Gross per 24 hour  Intake 1296 ml  Output 300 ml  Net 996 ml    Last 3 Weights 04/24/2021 04/21/2021 04/16/2021  Weight (lbs) 352 lb 1.2 oz 353 lb 6.4 oz 350 lb  Weight (kg) 159.7 kg 160.3 kg 158.759 kg      Telemetry    Atrial flutter vs atrial tachycardia rate controlled - Personally Reviewed    Physical Exam   GEN: NAD, obese Neck: supple, JVP difficult Cardiac: irregular, no gallop Respiratory: CTA; no rhonchi GI: Soft, NT/ND MS: 1+ thigh edema Neuro:  No focal findings Psych: Normal affect   Labs    High Sensitivity Troponin:   Recent Labs  Lab 04/16/21 1721 04/16/21 1827  TROPONINIHS 13 12       Chemistry Recent Labs  Lab 04/19/21 0531 04/20/21 0433 04/21/21 0505 04/23/21 0529 04/24/21 0554 04/25/21 0547  NA 138 136   < > 137 135 136  K 3.4* 4.1   < > 4.4 4.1 4.3  CL 99 98   < > 98 98 100  CO2 32 34*   < > _1 GLUCOSE 88 90   < > 115* 144* 92  BUN 14 14   < > 23* 22* 23*  CREATININE 0.78 0.81   < > 0.97 0.98 0.92  CALCIUM 9.0 9.0   < > 9.3 9.2 9.3  MG 1.8 2.0  --   --   --   --   GFRNONAA >60 >60   < > >60 >60 >60  ANIONGAP 7 4*   < > _2 < > = values in this interval not displayed.      Hematology Recent Labs  Lab 04/21/21 0505 04/22/21 0542 04/24/21 0554  WBC 5.7 6.1 7.1  RBC 4.88 5.02 5.19*  HGB 10.9* 11.3* 11.7*  HCT 37.7 38.7 40.2  MCV 77.3* 77.1* 77.5*  MCH 22.3*  22.5* 22.5*  MCHC 28.9* 29.2* 29.1*  RDW 21.2* 21.5* 22.8*  PLT 345 385 414*       Cardiac Studies   Echo 04/17/21   1. Left ventricular ejection fraction, by estimation, is 45 to 50%. Left  ventricular ejection fraction by 2D MOD biplane is 46.2 %. The left  ventricle has mildly decreased function. The left ventricle demonstrates  global hypokinesis. Indeterminate  diastolic filling due to E-A fusion.   2. Right ventricular systolic function is normal. The right ventricular  size is moderately enlarged. There is mildly elevated pulmonary artery  systolic pressure. The estimated right ventricular systolic pressure is  38.4 mmHg.   3. Right atrial size was severely dilated.   4. The mitral valve is abnormal. Mild mitral valve regurgitation.   5. Tricuspid valve regurgitation is mild to moderate.   6. The aortic valve is tricuspid. Aortic valve regurgitation is not  visualized.   7. The inferior vena cava is dilated in size with <50% respiratory  variability, suggesting right atrial pressure of 15 mmHg.   Comparison(s): No prior Echocardiogram.   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 She was initially tachycardic felt due to sinus tach. Found to have CHF with EF 45-50% without prior to compare to.   Assessment & Plan    1 acute on chronic combined systolic/diastolic congestive heart failure---32879 since admission.  We will continue Lasix at present dose.  Continue to follow renal function.  As outlined previously have not added Farxiga due to obesity/risk of fungal infection.    2 atrial flutter-telemetry reviewed and patient remains in atrial flutter versus ectopic atrial tachycardia.  Rate is controlled.  Continue metoprolol.  Patient complains of nausea.  This may be secondary to amiodarone.  Decrease to 200 mg daily to see if symptoms improve.  Continue apixaban.  Plan is to proceed with TEE guided cardioversion on Monday.  She will follow-up with electrophysiology after discharge for consideration of ablation.  3 morbid obesity-we will need evaluation for obstructive sleep apnea as an outpatient.  She is noted to have right atrial enlargement/tricuspid regurgitation on her echocardiogram likely related to pulmonary hypertension from sleep apnea and obesity hypoventilation syndrome.  4 vaginal bleeding-GYN has evaluated and plans outpatient follow-up.  Megace added.  Hemoglobin is unchanged and no vaginal bleeding at present.  5 microcytic anemia-hemoglobin remains unchanged.  Continue to follow.  For questions or updates, please contact [place] Please consult [email or web address] for contact info under        Signed, Kirk Ruths, MD  04/25/2021, 6:53 AM

## 2021-04-26 ENCOUNTER — Inpatient Hospital Stay (HOSPITAL_COMMUNITY): Payer: Medicaid Other

## 2021-04-26 ENCOUNTER — Inpatient Hospital Stay (HOSPITAL_COMMUNITY): Payer: Medicaid Other | Admitting: General Practice

## 2021-04-26 ENCOUNTER — Encounter (HOSPITAL_COMMUNITY): Payer: Self-pay | Admitting: Internal Medicine

## 2021-04-26 ENCOUNTER — Encounter (HOSPITAL_COMMUNITY)
Admission: EM | Disposition: A | Discharge status: Home Health | Payer: Self-pay | Source: Home / Self Care | Attending: Family Medicine

## 2021-04-26 DIAGNOSIS — R9431 Abnormal electrocardiogram [ECG] [EKG]: Secondary | ICD-10-CM

## 2021-04-26 DIAGNOSIS — I361 Nonrheumatic tricuspid (valve) insufficiency: Secondary | ICD-10-CM

## 2021-04-26 HISTORY — PX: CARDIOVERSION: SHX1299

## 2021-04-26 HISTORY — PX: TEE WITHOUT CARDIOVERSION: SHX5443

## 2021-04-26 LAB — PROTIME-INR
INR: 1.4 — ABNORMAL HIGH (ref 0.8–1.2)
Prothrombin Time: 17 seconds — ABNORMAL HIGH (ref 11.4–15.2)

## 2021-04-26 LAB — BASIC METABOLIC PANEL
Anion gap: 8 (ref 5–15)
BUN: 23 mg/dL — ABNORMAL HIGH (ref 6–20)
CO2: 27 mmol/L (ref 22–32)
Calcium: 9 mg/dL (ref 8.9–10.3)
Chloride: 101 mmol/L (ref 98–111)
Creatinine, Ser: 0.91 mg/dL (ref 0.44–1.00)
GFR, Estimated: >60 mL/min (ref >60)
Glucose, Bld: 93 mg/dL (ref 70–99)
Potassium: 4.2 mmol/L (ref 3.5–5.1)
Sodium: 136 mmol/L (ref 135–145)

## 2021-04-26 SURGERY — ECHOCARDIOGRAM, TRANSESOPHAGEAL
Anesthesia: General

## 2021-04-26 MED ORDER — ONDANSETRON HCL 4 MG/2ML IJ SOLN
4.0000 mg | Freq: Three times a day (TID) | INTRAMUSCULAR | Status: DC | PRN
  Administered 2021-04-26 – 2021-04-30 (×3): 4 mg via INTRAVENOUS
  Filled 2021-04-26 (×3): qty 2

## 2021-04-26 MED ORDER — FUROSEMIDE 10 MG/ML IJ SOLN
40.0000 mg | Freq: Every day | INTRAMUSCULAR | Status: DC
  Filled 2021-04-26: qty 4

## 2021-04-26 MED ORDER — METOPROLOL TARTRATE 25 MG PO TABS
25.0000 mg | ORAL_TABLET | Freq: Two times a day (BID) | ORAL | Status: DC
  Filled 2021-04-26: qty 1

## 2021-04-26 MED ORDER — LIDOCAINE 2% (20 MG/ML) 5 ML SYRINGE
INTRAMUSCULAR | Status: DC | PRN
  Administered 2021-04-26: 100 mg via INTRAVENOUS

## 2021-04-26 MED ORDER — PROMETHAZINE HCL 25 MG/ML IJ SOLN: 6.2500 mg | INTRAMUSCULAR | Status: DC | PRN

## 2021-04-26 MED ORDER — EPHEDRINE SULFATE-NACL 50-0.9 MG/10ML-% IV SOSY
PREFILLED_SYRINGE | INTRAVENOUS | Status: DC | PRN
Start: 2021-04-26 — End: 2021-04-26
  Administered 2021-04-26: 10 mg via INTRAVENOUS
  Administered 2021-04-26: 5 mg via INTRAVENOUS
  Administered 2021-04-26: 10 mg via INTRAVENOUS

## 2021-04-26 MED ORDER — ONDANSETRON HCL 4 MG/2ML IJ SOLN
4.0000 mg | Freq: Once | INTRAMUSCULAR | Status: AC
  Administered 2021-04-26: 4 mg via INTRAVENOUS

## 2021-04-26 MED ORDER — FENTANYL CITRATE (PF) 100 MCG/2ML IJ SOLN: 25.0000 ug | INTRAMUSCULAR | Status: DC | PRN

## 2021-04-26 MED ORDER — PHENYLEPHRINE 40 MCG/ML (10ML) SYRINGE FOR IV PUSH (FOR BLOOD PRESSURE SUPPORT)
PREFILLED_SYRINGE | INTRAVENOUS | Status: DC | PRN
  Administered 2021-04-26: 80 ug via INTRAVENOUS
  Administered 2021-04-26 (×2): 120 ug via INTRAVENOUS
  Administered 2021-04-26: 80 ug via INTRAVENOUS

## 2021-04-26 MED ORDER — PROPOFOL 500 MG/50ML IV EMUL
INTRAVENOUS | Status: DC | PRN
  Administered 2021-04-26: 100 ug/kg/min via INTRAVENOUS

## 2021-04-26 NOTE — Progress Notes (Signed)
Progress Note  Patient Name: Amber Herring Date of Encounter: 04/26/2021  [facility] Cardiologist: Pixie Casino, MD   Subjective   Mild chest soreness. Very weak and nauseated.   Inpatient Medications    Scheduled Meds:  (feeding supplement) PROSource Plus  30 mL Oral BID BM   amiodarone  200 mg Oral Daily   apixaban  5 mg Oral BID   ferrous sulfate  325 mg Oral TID WC   furosemide  40 mg Intravenous BID   influenza vac split quadrivalent PF  0.5 mL Intramuscular Tomorrow-1000   megestrol  40 mg Oral BID   metoprolol tartrate  50 mg Oral BID   pantoprazole  40 mg Oral Daily   potassium chloride  20 mEq Oral TID   Ensure Max Protein  11 oz Oral Daily   sodium chloride flush  3 mL Intravenous Q12H   Continuous Infusions:  sodium chloride     PRN Meds: sodium chloride, acetaminophen **OR** [DISCONTINUED] acetaminophen, alum & mag hydroxide-simeth, oxyCODONE, sodium chloride flush, traMADol   Vital Signs    Vitals:   04/26/21 1220 04/26/21 1235 04/26/21 1250 04/26/21 1335  BP: (!) 89/69 90/67 (!) 87/58 92/67  Pulse: (!) 51 (!) 51 (!) 53 (!) 55  Resp: _0 Temp:   97.8 F (36.6 C) 97.8 F (36.6 C)  TempSrc:    Oral  SpO2: 98% 95% 93% 96%  Weight:      Height:        Intake/Output Summary (Last 24 hours) at 04/26/2021 1445 Last data filed at 04/26/2021 1204 Gross per 24 hour  Intake 870 ml  Output 4405 ml  Net -3535 ml   Last 3 Weights 04/26/2021 04/24/2021 04/21/2021  Weight (lbs) 352 lb 15.3 oz 352 lb 1.2 oz 353 lb 6.4 oz  Weight (kg) 160.1 kg 159.7 kg 160.3 kg      Telemetry    Sinus bradycardia with HR low 50s - Personally Reviewed  ECG    Sinus bradycardia with HR 47 - Personally Reviewed  Physical Exam   GEN: weak Neck: No JVD Cardiac: RRR, no murmurs, rubs, or gallops.  Respiratory: Clear to auscultation bilaterally. GI: Soft, nontender, non-distended  MS: No edema; No deformity. Neuro:  Nonfocal  Psych: Normal affect   Labs     High Sensitivity Troponin:   Recent Labs  Lab 04/16/21 1721 04/16/21 1827  TROPONINIHS 13 12     Chemistry Recent Labs  Lab 04/20/21 0433 04/21/21 0505 04/24/21 0554 04/25/21 0547 04/26/21 0557  NA 136   < > 135 136 136  K 4.1   < > 4.1 4.3 4.2  CL 98   < > 98 100 101  CO2 34*   < > _1 GLUCOSE 90   < > 144* 92 93  BUN 14   < > 22* 23* 23*  CREATININE 0.81   < > 0.98 0.92 0.91  CALCIUM 9.0   < > 9.2 9.3 9.0  MG 2.0  --   --   --   --   GFRNONAA >60   < > >60 >60 >60  ANIONGAP 4*   < > _2 < > = values in this interval not displayed.    Lipids No results for input(s): CHOL, TRIG, HDL, LABVLDL, LDLCALC, CHOLHDL in the last 168 hours.  Hematology Recent Labs  Lab 04/21/21 0505 04/22/21 0542 04/24/21 0554  WBC 5.7 6.1 7.1  RBC 4.88 5.02 5.19*  HGB 10.9* 11.3* 11.7*  HCT 37.7 38.7 40.2  MCV 77.3* 77.1* 77.5*  MCH 22.3* 22.5* 22.5*  MCHC 28.9* 29.2* 29.1*  RDW 21.2* 21.5* 22.8*  PLT 345 385 414*   Thyroid No results for input(s): TSH, FREET4 in the last 168 hours.  BNPNo results for input(s): BNP, PROBNP in the last 168 hours.  DDimer No results for input(s): DDIMER in the last 168 hours.   Radiology    No results found.  Cardiac Studies   Echo 04/17/2021   1. Left ventricular ejection fraction, by estimation, is 45 to 50%. Left  ventricular ejection fraction by 2D MOD biplane is 46.2 %. The left  ventricle has mildly decreased function. The left ventricle demonstrates  global hypokinesis. Indeterminate  diastolic filling due to E-A fusion.   2. Right ventricular systolic function is normal. The right ventricular  size is moderately enlarged. There is mildly elevated pulmonary artery  systolic pressure. The estimated right ventricular systolic pressure is  35.6 mmHg.   3. Right atrial size was severely dilated.   4. The mitral valve is abnormal. Mild mitral valve regurgitation.   5. Tricuspid valve regurgitation is mild to moderate.   6.  The aortic valve is tricuspid. Aortic valve regurgitation is not  visualized.   7. The inferior vena cava is dilated in size with <50% respiratory  variability, suggesting right atrial pressure of 15 mmHg.   Comparison(s): No prior Echocardiogram.   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   Assessment & Plan    Acute on chronic combined systolic diastolic heart failure  - Echo 12/31 EF 45-50%, moderately enlarged RV, severe RAE, mild MR, mild to moderate TR  -  renal function stable with IV diuresis. I/O - 38.6 L so far. Difficult to assess true volume status due to morbid obesity  Atrial flutter - on PO amiodarone, metoprolol and ELiquis  - underwent TEE DCCV  - had bradycardia (transient junctional rhythm) and hypotension afterward. Given ephedrine and neo with improvement  - consider reduce metoprolol by half. PRN zofran for N/V. Nurse to recheck BP.    Morbid obesity  Vaginal bleeding  Microcytic anemia      For questions or updates, please contact [place] Please consult [email or web address] for contact info under        Signed, Almyra Deforest, [place]  04/26/2021, 2:45 PM

## 2021-04-26 NOTE — Progress Notes (Signed)
PROGRESS NOTE    Amber Herring  KGY:[id number] DOB: 11-Nov-1960 DOA: 04/16/2021  PCP: Denita Lung, MD   Brief Narrative:  This 61 yrs old Morbidly obese female with PMH significant for depression, chronic anemia, GERD, and HLD who presented to the ED with c/o: generalized swelling primarily of the legs and abdomen. ROS was also positive for intermittent dysfunctional vaginal bleeding.  Patient is admitted for acute systolic CHF requiring IV diuresis.  Hospital course complicated by atrial fibrillation requiring amiodarone, Cardiology is consulted, GYN is consulted for  vaginal bleeding, started on Megace and outpatient follow-up recommended.  Her hemoglobin remained stable,  Patient is started on Eliquis.  She will be monitored over the weekend for any recurrent bleeding.  If she remains stable , on Monday she would be considered for TEE guided cardioversion.  Assessment & Plan:   Principal Problem:   Anasarca Active Problems:   Lower urinary tract infectious disease   Morbid obesity ([place])   Vaginal bleeding   Iron deficiency anemia due to chronic blood loss   Cardiomegaly   Chest pain   Atrial flutter (HCC)   CAP (community acquired pneumonia)   Pressure injury of skin  Acute on chronic combined systolic and diastolic  CHF /Anasarca: Patient presented with generalized body swelling more pronounced in the legs and abdomen.   Echocardiogram shows LVEF 45 to 50% with global hypokinesis and mildly dilated PASP.  Cardiology consulted. Continue aggressive diuresis, Net negative balance 34.8 L since admission. Continue daily weight, intake output charting. Continue IV Lasix 40 mg every 8 hours. Venous duplex negative for DVT.  Intake/Output Summary (Last 24 hours) at 04/26/2021 1154 Last data filed at 04/26/2021 0600 Gross per 24 hour  Intake 720 ml  Output 4405 ml  Net -3685 ml    Atrial Flutter /Ectopic atrial tachycardia EKG showed atrial flutter versus ectopic atrial  tachycardia. This is contributing to the CHF exacerbation. Cardiology recommended to continue metoprolol and amiodarone for rate control. Patient is started on Eliquis, She had no recurrence for dysfunctional uterine bleeding. Patient is scheduled to have TEE guided cardioversion today.  Hypokalemia: Replaced and resolved.  Hypomagnesemia: Replaced and resolved.  Morbid obesity: Diet and exercise discussed in detail  Vaginal bleeding: H&H remained stable. Pelvic ultrasound without acute findings, not able to visualize all structures. No evidence of ongoing bleeding presently. GYN consulted, started patient on Megace and recommended outpatient follow-up. She reports vaginal bleeding has improved.  Iron deficiency anemia: Patient has received IV iron therapy. H / H stable.  UTI: No clear signs and symptoms to suggest  UTI. Continue to monitor off antibiotics.  Pneumonia ruled out.: Normal procalcitonin, normal WBCs, Antibiotic discontinued.  DVT prophylaxis: SCDs Code Status: Full code. Family Communication:  No family at bed side. Disposition Plan:   Admitted for generalized anasarca /  CHF exacerbation requiring IV diuresis. Hospital course complicated by atrial fibrillation, continuous dysfunctional uterine bleeding. Patient is started on Megace for dysfunctional uterine bleeding, DUB resolved. Patient started on Eliquis for A. Fib.   Plan is for TEE guided cardioversion on Monday.  Anticipated discharge to skilled nursing facility once insurance approves placement.  Consultants:  Cardiology  Procedures: Echocardiogram. Antimicrobials:   Anti-infectives (From admission, onward)    Start     Dose/Rate Route Frequency Ordered Stop   04/17/21 2100  cefTRIAXone (ROCEPHIN) 1 g in sodium chloride 0.9 % 100 mL IVPB  Status:  Discontinued        1 g 200 mL/hr  over 30 Minutes Intravenous Every 24 hours 04/16/21 2129 04/17/21 1821   04/17/21 0145  azithromycin  (ZITHROMAX) 500 mg in sodium chloride 0.9 % 250 mL IVPB  Status:  Discontinued        500 mg 250 mL/hr over 60 Minutes Intravenous Every 24 hours 04/17/21 0132 04/17/21 1821   04/16/21 2045  cefTRIAXone (ROCEPHIN) 1 g in sodium chloride 0.9 % 100 mL IVPB        1 g 200 mL/hr over 30 Minutes Intravenous  Once 04/16/21 2042 04/16/21 2130       Subjective: Patient was seen and examined at bedside. Overnight events noted.   She still appears volume overloaded, continue on aggressive diuresis. She reports vaginal bleeding has resolved. She is scheduled to have TEE today. She reports having bilateral knee pain,  asks for pain medications.  Objective: Vitals:   04/25/21 2053 04/26/21 0454 04/26/21 1002 04/26/21 1100  BP: 94/65 102/69 101/65 105/63  Pulse: 93 93 93 93  Resp: 18 17  (!) 21  Temp: 98.3 F (36.8 C) 97.6 F (36.4 C)  (!) 97.5 F (36.4 C)  TempSrc:  Oral  Temporal  SpO2: 98% 97%  99%  Weight:  (!) 160.1 kg    Height:        Intake/Output Summary (Last 24 hours) at 04/26/2021 1154 Last data filed at 04/26/2021 0600 Gross per 24 hour  Intake 720 ml  Output 4405 ml  Net -3685 ml   Filed Weights   04/21/21 0520 04/24/21 0444 04/26/21 0454  Weight: (!) 160.3 kg (!) 159.7 kg (!) 160.1 kg    Examination:  General exam: Appears comfortable, deconditioned, not in any distress. Respiratory system: Clear to auscultation bilaterally, no wheezing, no crackles. Cardiovascular system: S1-S2 heard, Irregular rhythm , rate controlled,  no murmur. Gastrointestinal system: Abdomen is soft, mildly distended, nontender, BS+ Central nervous system: Alert and oriented x 3 . No focal neurological deficits. Extremities: Bilateral pedal edema+, no cyanosis, no clubbing. Skin: No rashes, lesions or ulcers. Psychiatry: Judgement and insight appear normal. Mood & affect appropriate.     Data Reviewed: I have personally reviewed following labs and imaging studies  CBC: Recent Labs  Lab  04/21/21 0505 04/22/21 0542 04/24/21 0554  WBC 5.7 6.1 7.1  HGB 10.9* 11.3* 11.7*  HCT 37.7 38.7 40.2  MCV 77.3* 77.1* 77.5*  PLT 345 385 790*   Basic Metabolic Panel: Recent Labs  Lab 04/20/21 0433 04/21/21 0505 04/22/21 0542 04/23/21 0529 04/24/21 0554 04/25/21 0547 04/26/21 0557  NA 136   < > 136 137 135 136 136  K 4.1   < > 4.1 4.4 4.1 4.3 4.2  CL 98   < > 97* 98 98 100 101  CO2 34*   < > _0 GLUCOSE 90   < > 95 115* 144* 92 93  BUN 14   < > 19 23* 22* 23* 23*  CREATININE 0.81   < > 0.82 0.97 0.98 0.92 0.91  CALCIUM 9.0   < > 8.9 9.3 9.2 9.3 9.0  MG 2.0  --   --   --   --   --   --    < > = values in this interval not displayed.   GFR: Estimated Creatinine Clearance: 110.4 mL/min (by C-G formula based on SCr of 0.91 mg/dL). Liver Function Tests: No results for input(s): AST, ALT, ALKPHOS, BILITOT, PROT, ALBUMIN in the last 168 hours.  No results for  input(s): LIPASE, AMYLASE in the last 168 hours. No results for input(s): AMMONIA in the last 168 hours. Coagulation Profile: Recent Labs  Lab 04/26/21 0557  INR 1.4*   Cardiac Enzymes: No results for input(s): CKTOTAL, CKMB, CKMBINDEX, TROPONINI in the last 168 hours. BNP (last 3 results) No results for input(s): PROBNP in the last 8760 hours. HbA1C: No results for input(s): HGBA1C in the last 72 hours. CBG: No results for input(s): GLUCAP in the last 168 hours. Lipid Profile: No results for input(s): CHOL, HDL, LDLCALC, TRIG, CHOLHDL, LDLDIRECT in the last 72 hours. Thyroid Function Tests: No results for input(s): TSH, T4TOTAL, FREET4, T3FREE, THYROIDAB in the last 72 hours. Anemia Panel: No results for input(s): VITAMINB12, FOLATE, FERRITIN, TIBC, IRON, RETICCTPCT in the last 72 hours. Sepsis Labs: No results for input(s): PROCALCITON, LATICACIDVEN in the last 168 hours.   Recent Results (from the past 240 hour(s))  Resp Panel by RT-PCR (Flu A&B, Covid) Nasopharyngeal Swab     Status: None    Collection Time: 04/16/21  5:20 PM   Specimen: Nasopharyngeal Swab; Nasopharyngeal(NP) swabs in vial transport medium  Result Value Ref Range Status   SARS Coronavirus 2 by RT PCR NEGATIVE NEGATIVE Final    Comment: (NOTE) SARS-CoV-2 target nucleic acids are NOT DETECTED.  The SARS-CoV-2 RNA is generally detectable in upper respiratory specimens during the acute phase of infection. The lowest concentration of SARS-CoV-2 viral copies this assay can detect is 138 copies/mL. A negative result does not preclude SARS-Cov-2 infection and should not be used as the sole basis for treatment or other patient management decisions. A negative result may occur with  improper specimen collection/handling, submission of specimen other than nasopharyngeal swab, presence of viral mutation(s) within the areas targeted by this assay, and inadequate number of viral copies(<138 copies/mL). A negative result must be combined with clinical observations, patient history, and epidemiological information. The expected result is Negative.  Fact Sheet for Patients:  EntrepreneurPulse.com.au  Fact Sheet for Healthcare Providers:  IncredibleEmployment.be  This test is no t yet approved or cleared by the [place] FDA and  has been authorized for detection and/or diagnosis of SARS-CoV-2 by FDA under an Emergency Use Authorization (EUA). This EUA will remain  in effect (meaning this test can be used) for the duration of the COVID-19 declaration under Section 564(b)(1) of the Act, 21 U.S.C.section 360bbb-3(b)(1), unless the authorization is terminated  or revoked sooner.       Influenza A by PCR NEGATIVE NEGATIVE Final   Influenza B by PCR NEGATIVE NEGATIVE Final    Comment: (NOTE) The Xpert Xpress SARS-CoV-2/FLU/RSV plus assay is intended as an aid in the diagnosis of influenza from Nasopharyngeal swab specimens and should not be used as a sole basis for treatment.  Nasal washings and aspirates are unacceptable for Xpert Xpress SARS-CoV-2/FLU/RSV testing.  Fact Sheet for Patients: EntrepreneurPulse.com.au  Fact Sheet for Healthcare Providers: IncredibleEmployment.be  This test is not yet approved or cleared by the [place] FDA and has been authorized for detection and/or diagnosis of SARS-CoV-2 by FDA under an Emergency Use Authorization (EUA). This EUA will remain in effect (meaning this test can be used) for the duration of the COVID-19 declaration under Section 564(b)(1) of the Act, 21 U.S.C. section 360bbb-3(b)(1), unless the authorization is terminated or revoked.  Performed at [facility], [place] [address], [place], [place] [address]     Radiology Studies: No results found.  Scheduled Meds:  [MAR Hold] (feeding supplement) PROSource Plus  30 mL Oral BID BM   [MAR Hold] amiodarone  200 mg Oral Daily   [MAR Hold] apixaban  5 mg Oral BID   [MAR Hold] ferrous sulfate  325 mg Oral TID WC   [MAR Hold] furosemide  40 mg Intravenous BID   influenza vac split quadrivalent PF  0.5 mL Intramuscular Tomorrow-1000   [MAR Hold] megestrol  40 mg Oral BID   [MAR Hold] metoprolol tartrate  50 mg Oral BID   [MAR Hold] pantoprazole  40 mg Oral Daily   [MAR Hold] potassium chloride  20 mEq Oral TID   [MAR Hold] Ensure Max Protein  11 oz Oral Daily   [MAR Hold] sodium chloride flush  3 mL Intravenous Q12H   Continuous Infusions:  [MAR Hold] sodium chloride     sodium chloride       LOS: 9 days    Time spent: 35 min    Cathalina Barcia, MD [facility]   If 7PM-7AM, please contact night-coverage

## 2021-04-26 NOTE — Transfer of Care (Signed)
Immediate Anesthesia Transfer of Care Note  Patient: Amber Herring  Procedure(s) Performed: TRANSESOPHAGEAL ECHOCARDIOGRAM (TEE) CARDIOVERSION  Patient Location: PACU  Anesthesia Type:MAC  Level of Consciousness: drowsy, patient cooperative and responds to stimulation  Airway & Oxygen Therapy: Patient Spontanous Breathing and Patient connected to nasal cannula oxygen  Post-op Assessment: Report given to RN and Post -op Vital signs reviewed and stable  Post vital signs: Reviewed and stable  Last Vitals:  Vitals Value Taken Time  BP 88/60 04/26/21 1205  Temp    Pulse 51 04/26/21 1207  Resp 19 04/26/21 1207  SpO2 98 % 04/26/21 1207  Vitals shown include unvalidated device data.  Last Pain:  Vitals:   04/26/21 1100  TempSrc: Temporal  PainSc: 0-No pain      Patients Stated Pain Goal: 0 (57/90/38 3338)  Complications: No notable events documented.

## 2021-04-26 NOTE — Procedures (Signed)
Procedure: Electrical Cardioversion Indications:  Atrial Flutter  Procedure Details:  Consent: Risks of procedure as well as the alternatives and risks of each were explained to the (patient/caregiver).  Consent for procedure obtained.  Time Out: Verified patient identification, verified procedure, site/side was marked, verified correct patient position, special equipment/implants available, medications/allergies/relevent history reviewed, required imaging and test results available. PERFORMED.  Patient placed on cardiac monitor, pulse oximetry, supplemental oxygen as necessary.  Sedation given:  Propofol 254m; lidcaine 1058mPacer pads placed anterior and posterior chest.  Cardioverted 1 time(s).  Cardioversion with synchronized biphasic 150J shock.  Evaluation: Findings: Post procedure EKG shows:  Sinus bradycardia and episodes of a junctional, narrow complex rhythm Complications: Patient was hypoxic during procedure in the setting of morbid obesity. TEE was aborted.    Time Spent Directly with the Patient:  60 minutes   Chatara Lucente E Shawnee Higham 04/26/2021, 12:00 PM

## 2021-04-26 NOTE — Progress Notes (Signed)
Pt transported from [facility] PACU via [facility]link to [facility]. Report called. VSS. Pt belongings sent with patient.  Clyde Canterbury, RN

## 2021-04-26 NOTE — Anesthesia Preprocedure Evaluation (Signed)
Anesthesia Evaluation  Patient identified by MRN, date of birth, ID band Patient awake    Reviewed: Allergy & Precautions, NPO status , Patient's Chart, lab work & pertinent test results  Airway Mallampati: II  TM Distance: >3 FB Neck ROM: Full    Dental  (+) Dental Advisory Given, Edentulous Upper, Poor Dentition   Pulmonary neg pulmonary ROS, former smoker,    Pulmonary exam normal breath sounds clear to auscultation       Cardiovascular + dysrhythmias Atrial Fibrillation  Rhythm:Irregular Rate:Abnormal     Neuro/Psych  Headaches, PSYCHIATRIC DISORDERS Anxiety    GI/Hepatic Neg liver ROS, GERD  ,  Endo/Other  Morbid obesity  Renal/GU negative Renal ROS     Musculoskeletal negative musculoskeletal ROS (+)   Abdominal   Peds  Hematology  (+) Blood dyscrasia, anemia ,   Anesthesia Other Findings Day of surgery medications reviewed with the patient.  Reproductive/Obstetrics                             Anesthesia Physical Anesthesia Plan  ASA: 4  Anesthesia Plan: General   Post-op Pain Management: Minimal or no pain anticipated   Induction: Intravenous  PONV Risk Score and Plan: 3 and TIVA and Treatment may vary due to age or medical condition  Airway Management Planned: Natural Airway and Nasal Cannula  Additional Equipment:   Intra-op Plan:   Post-operative Plan:   Informed Consent: I have reviewed the patients History and Physical, chart, labs and discussed the procedure including the risks, benefits and alternatives for the proposed anesthesia with the patient or authorized representative who has indicated his/her understanding and acceptance.     Dental advisory given  Plan Discussed with: CRNA  Anesthesia Plan Comments:         Anesthesia Quick Evaluation

## 2021-04-26 NOTE — CV Procedure (Addendum)
Transesophageal Echocardiogram Note  Amber Herring [id number] [birth year]  Procedure: Transesophageal Echocardiogram Indications: Atrial fibrillation  Procedure Details Consent: Obtained Time Out: Verified patient identification, verified procedure, site/side was marked, verified correct patient position, special equipment/implants available, Radiology Safety Procedures followed,  medications/allergies/relevent history reviewed, required imaging and test results available.  Performed  Medications: Propofol: 220m Lidocaine: 1049m  Left Ventrical:  LVEF 50%  Mitral Valve: Normal structure; mild MR  Aortic Valve: Tricuspid, no AI  Tricuspid Valve: Normal structure, severe TR  Pulmonic Valve: Normal structure, no PI  Left Atrium/ Left atrial appendage: No LAA thrombus  Atrial septum: Grossly normal  Aorta: No significant plaque   Complications: Patient became hypoxic during the procedure and procedure was aborted. She was also hypotensive and received a dose of ephedrine and neo with improvement   HeFreada BergeronMD 04/26/2021, 11:55 AM

## 2021-04-26 NOTE — Progress Notes (Signed)
Nutrition Follow-up  DOCUMENTATION CODES:   Morbid obesity  INTERVENTION:   -Ensure MAX Protein po daily, each supplement provides 150 kcal and 30 grams of protein    -Prosource Plus PO BID, each provides 100 kcals and 15g protein  NUTRITION DIAGNOSIS:   Increased nutrient needs related to wound healing as evidenced by estimated needs.  Ongoing.  GOAL:   Patient will meet greater than or equal to 90% of their needs  Progressing.  MONITOR:   PO intake, Supplement acceptance, Labs, Weight trends, I & O's, Skin  ASSESSMENT:   60yo with a history of morbid obesity, depression, chronic anemia, GERD, and HLD who presented to the ED with severe generalized swelling primarily of the legs and abdomen.  ROS was also positive for intermittent dysfunctional vaginal bleeding.  Patient was NPO for TEE today. Pt has been consuming 100% of her meals. Accepting Ensure Max and prosource.  Admission weight: 350 lbs Current weight: 352 lbs  Medications: Ferrous sulfate, Lasix, Megace, KLOR-CON  Labs reviewed.  Diet Order:   Diet Order     None       EDUCATION NEEDS:   Education needs have been addressed on 1/2  Skin:  Skin Assessment: Skin Integrity Issues: Skin Integrity Issues:: Stage II Stage II: left thigh  Last BM:  1/8 -type 4  Height:   Ht Readings from Last 1 Encounters:  04/16/21 5' 9" (1.753 m)    Weight:   Wt Readings from Last 1 Encounters:  04/26/21 (!) 160.1 kg    BMI:  Body mass index is 52.12 kg/m.  Estimated Nutritional Needs:   Kcal:  2250-2450  Protein:  120-130g  Fluid:  1.8L -per fluid restriction  Clayton Bibles, MS, RD, LDN Inpatient Clinical Dietitian Contact information available via Amion

## 2021-04-26 NOTE — Anesthesia Procedure Notes (Signed)
Procedure Name: MAC Date/Time: 04/26/2021 11:24 AM Performed by: Dorann Lodge, CRNA Pre-anesthesia Checklist: Patient identified, Emergency Drugs available, Suction available, Patient being monitored and Timeout performed Patient Re-evaluated:Patient Re-evaluated prior to induction Oxygen Delivery Method: Nasal cannula Preoxygenation: Pre-oxygenation with 100% oxygen Induction Type: IV induction

## 2021-04-26 NOTE — Anesthesia Postprocedure Evaluation (Signed)
Anesthesia Post Note  Patient: KOLBI TOFTE  Procedure(s) Performed: TRANSESOPHAGEAL ECHOCARDIOGRAM (TEE) CARDIOVERSION     Patient location during evaluation: PACU Anesthesia Type: General Level of consciousness: awake and alert Pain management: pain level controlled Vital Signs Assessment: post-procedure vital signs reviewed and stable Respiratory status: spontaneous breathing, nonlabored ventilation, respiratory function stable and patient connected to nasal cannula oxygen Cardiovascular status: blood pressure returned to baseline and stable Postop Assessment: no apparent nausea or vomiting Anesthetic complications: no   No notable events documented.  Last Vitals:  Vitals:   04/26/21 1250 04/26/21 1335  BP: (!) 87/58 92/67  Pulse: (!) 53 (!) 55  Resp: 12 15  Temp: 36.6 C 36.6 C  SpO2: 93% 96%    Last Pain:  Vitals:   04/26/21 1335  TempSrc: Oral  PainSc: 0-No pain                 Catalina Gravel

## 2021-04-26 NOTE — Progress Notes (Signed)
Echocardiogram Echocardiogram Transesophageal has been performed.  Bobbye Charleston 04/26/2021, 12:07 PM

## 2021-04-27 ENCOUNTER — Encounter (HOSPITAL_COMMUNITY): Payer: Self-pay | Admitting: Cardiology

## 2021-04-27 DIAGNOSIS — I484 Atypical atrial flutter: Secondary | ICD-10-CM

## 2021-04-27 DIAGNOSIS — I5043 Acute on chronic combined systolic (congestive) and diastolic (congestive) heart failure: Principal | ICD-10-CM

## 2021-04-27 LAB — BASIC METABOLIC PANEL
Anion gap: 13 (ref 5–15)
BUN: 34 mg/dL — ABNORMAL HIGH (ref 6–20)
CO2: 25 mmol/L (ref 22–32)
Calcium: 9.3 mg/dL (ref 8.9–10.3)
Chloride: 97 mmol/L — ABNORMAL LOW (ref 98–111)
Creatinine, Ser: 1.17 mg/dL — ABNORMAL HIGH (ref 0.44–1.00)
GFR, Estimated: 54 mL/min — ABNORMAL LOW (ref >60)
Glucose, Bld: 121 mg/dL — ABNORMAL HIGH (ref 70–99)
Potassium: 5.2 mmol/L — ABNORMAL HIGH (ref 3.5–5.1)
Sodium: 135 mmol/L (ref 135–145)

## 2021-04-27 LAB — CBC
HCT: 43.4 % (ref 36.0–46.0)
Hemoglobin: 12.6 g/dL (ref 12.0–15.0)
MCH: 23.1 pg — ABNORMAL LOW (ref 26.0–34.0)
MCHC: 29 g/dL — ABNORMAL LOW (ref 30.0–36.0)
MCV: 79.5 fL — ABNORMAL LOW (ref 80.0–100.0)
Platelets: 444 10*3/uL — ABNORMAL HIGH (ref 150–400)
RBC: 5.46 MIL/uL — ABNORMAL HIGH (ref 3.87–5.11)
RDW: 24 % — ABNORMAL HIGH (ref 11.5–15.5)
WBC: 6.9 10*3/uL (ref 4.0–10.5)
nRBC: 0 % (ref 0.0–0.2)

## 2021-04-27 LAB — POTASSIUM: Potassium: 5.2 mmol/L — ABNORMAL HIGH (ref 3.5–5.1)

## 2021-04-27 MED ORDER — METOPROLOL TARTRATE 25 MG PO TABS: 25.0000 mg | ORAL_TABLET | Freq: Two times a day (BID) | ORAL | Status: DC

## 2021-04-27 MED ORDER — BUMETANIDE 1 MG PO TABS
1.0000 mg | ORAL_TABLET | Freq: Two times a day (BID) | ORAL | Status: DC
  Administered 2021-04-27 – 2021-05-06 (×18): 1 mg via ORAL
  Filled 2021-04-27 (×20): qty 1

## 2021-04-27 MED ORDER — METOPROLOL SUCCINATE ER 25 MG PO TB24
25.0000 mg | ORAL_TABLET | Freq: Every day | ORAL | Status: DC
  Administered 2021-04-28 – 2021-05-02 (×4): 25 mg via ORAL
  Filled 2021-04-27 (×6): qty 1

## 2021-04-27 NOTE — Progress Notes (Addendum)
PT NOTE   04/27/21 1400 04/27/21 1413  PT Visit Information  Last PT Received On 04/27/21  --   Assistance Needed +2  --   History of Present Illness Patient presents to the ER 04/16/21 with chief complaint of shortness of breath and swelling of her lower abdomen and bilateral lower extremities, anasarca. PMH: recent left hand fx-3 months ago), GERD, obesity, metabolic syndrome, anxiety.  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  --   Restrictions  Weight Bearing Restrictions No  --   Pain Assessment  Pain Assessment Faces  --   Faces Pain Scale 6  --   Pain Location B knees (Arthritis)  --   Pain Descriptors / Indicators Grimacing;Aching  --   Pain Intervention(s) Limited activity within patient's tolerance;Monitored during session;Repositioned  --   Cognition  Arousal/Alertness Awake/alert  --   Behavior During Therapy WFL for tasks assessed/performed  --   Overall Cognitive Status Within Functional Limits for tasks assessed  --   General Comments AxO x 3 motivated and willing but limited by B knee pain and body habitus; required encouragemetn on this date, cooperative once encouraged  --   Bed Mobility  Overal bed mobility Needs Assistance  --   Bed Mobility Supine to Sit  --   Rolling Min assist;+2 for physical assistance;+2 for safety/equipment  --   General bed mobility comments asssist with LEs off bed, incr time and effort  --   Transfers  Overall transfer level Needs assistance  --   Equipment used Rolling walker (2 wheels) (Bariatric RW)  --   Transfers Sit to/from Stand;Bed to chair/wheelchair/BSC  --   Sit to Stand Min assist;+2 physical assistance;+2 safety/equipment  --   Bed to/from  chair/wheelchair/BSC transfer type: Step pivot  --   Step pivot transfers Mod assist;Min assist;+2 physical assistance;+2 safety/equipment  --   General transfer comment assist to rise and transition to RW. assist to maneuver RW and pivot to [facility]. pt keeps L knee in flexion, wide BOS, RLE positioned anteriorly. much difficulty wt shfting d/t knee pain  --   General Exercises - Lower Extremity  Ankle Circles/Pumps Supine;AROM;Both;15 reps  --   Heel Slides AROM;Both;5 reps  --   Long Arc Quad AROM;Both;10 reps;Seated  --   PT - End of Session  Equipment Utilized During Treatment Gait belt  --   Activity Tolerance Patient tolerated treatment well;Patient limited by pain  --   Patient left in chair;with call bell/phone within reach [facility])  --   Nurse Communication Mobility status  --    PT - Assessment/Plan  PT Plan Current plan remains appropriate  --   PT Visit Diagnosis Unsteadiness on feet (R26.81);Difficulty in walking, not elsewhere classified (R26.2);Pain  --   Pain - Right/Left Left  --   Pain - part of body Knee  --   PT Frequency (ACUTE ONLY) Min 2X/week  --   Follow Up Recommendations Skilled nursing-short term rehab (<3 hours/day)  --   Assistance recommended at discharge Frequent or constant Supervision/Assistance  --   Patient can return home with the following Two people to help  with walking and/or transfers;Two people to help with bathing/dressing/bathroom;Assistance with cooking/housework;Assist for transportation  --   AM-PAC PT "6 Clicks" Mobility Outcome Measure (Version 2)  Help needed turning from your back to your side while in a flat bed without using bedrails? 2 2  Help needed moving from lying on your back to sitting on the side of a flat bed without using bedrails? 2 2  Help needed moving to and from a bed to a chair (including a wheelchair)? 1 1  Help needed standing up from a chair using your arms (e.g., wheelchair or bedside chair)? 1 1  Help needed to walk in  hospital room? 1 1  Help needed climbing 3-5 steps with a railing?  <[id number]>_2<\/NFAOZHYQMVHQIONG>_2 Click Score 8 8  Consider Recommendation of Discharge To: CIR/SNF/LTACH CIR/SNF/LTACH  Progressive Mobility  What is the highest level of mobility based on the progressive mobility assessment? Level 2 (Chairfast) - Balance while sitting on edge of bed and cannot stand  --   Mobility Out of bed for toileting;Out of bed to chair with meals  --   PT Goal Progression  Progress towards PT goals Progressing toward goals  --   Acute Rehab PT Goals  PT Goal Formulation With patient  --   Time For Goal Achievement 05/01/21  --   Potential to Achieve Goals Fair  --   PT Time Calculation  PT Start Time (ACUTE ONLY) 1000   PT Stop Time (ACUTE ONLY) 1045   PT Time Calculation (min) (ACUTE ONLY) 45 min   PT General Charges  $$ ACUTE PT VISIT 1 Visit  --   PT Treatments  $Therapeutic Activity 38-52 mins  --

## 2021-04-27 NOTE — Progress Notes (Signed)
PT NOTE  04/27/21 1413    Last PT Received On 04/27/21  Assistance Needed +2  History of Present Illness Patient presents to the ER 04/16/21 with chief complaint of shortness of breath and swelling of her lower abdomen and bilateral lower extremities, anasarca. PMH: recent left hand fx-3 months ago), GERD, obesity, metabolic syndrome, anxiety.  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  Restrictions  Weight Bearing Restrictions No  Pain Assessment  Pain Assessment Faces  Faces Pain Scale 6  Pain Location B knees (Arthritis)  Pain Descriptors / Indicators Grimacing;Aching  Pain Intervention(s) Limited activity within patient's tolerance;Monitored during session;Repositioned  Cognition  Arousal/Alertness Awake/alert  Behavior During Therapy WFL for tasks assessed/performed  Overall Cognitive Status Within Functional Limits for tasks assessed  General Comments AxO x 3 motivated and willing but limited by B knee pain and body habitus; required encouragemetn on this date, cooperative once encouraged  Bed Mobility  General bed mobility comments  (on BSC)  Transfers  Overall transfer level Needs assistance  Equipment used Rolling walker (2 wheels) (Bariatric RW)  Transfers Sit to/from Stand;Bed to chair/wheelchair/BSC  Sit to Stand Min assist;+2 physical assistance;+2 safety/equipment  Step pivot transfers Mod assist;Min assist;+2 physical assistance;+2 safety/equipment  General transfer comment assist to rise and transition to RW. assist to maneuver RW. pt able to stand from [facility]  switched for bari chair; pt keeps L knee in flexion, wide BOS, RLE positioned anterior, significant difficulty wt shifting  Balance  Sitting balance-Leahy Scale  Fair  Standing balance support During functional activity;Reliant on assistive device for balance  Standing balance-Leahy Scale Poor  General Exercises - Lower Extremity  Ankle Circles/Pumps Supine;AROM;Both;15 reps  PT - End of Session  Equipment Utilized During Treatment Gait belt  Activity Tolerance Patient tolerated treatment well;Patient limited by pain  Patient left in chair;with call bell/phone within reach  Nurse Communication Mobility status   PT - Assessment/Plan  PT Plan Current plan remains appropriate  PT Visit Diagnosis Unsteadiness on feet (R26.81);Difficulty in walking, not elsewhere classified (R26.2);Pain  Pain - Right/Left Left  Pain - part of body Knee  PT Frequency (ACUTE ONLY) Min 2X/week  Follow Up Recommendations Skilled nursing-short term rehab (<3 hours/day)  Assistance recommended at discharge Frequent or constant Supervision/Assistance  Patient can return home with the following Two people to help with walking and/or transfers;Two people to help with bathing/dressing/bathroom;Assistance with cooking/housework;Assist for transportation  AM-PAC PT "6 Clicks" Mobility Outcome Measure (Version 2)  Help needed turning from your back to your side while in a flat bed without using bedrails? 2  Help needed moving from lying on your back to sitting on the side of a flat bed without using bedrails? 2  Help needed moving to and from a bed to a chair (including a wheelchair)? 1  Help needed standing up from a chair using your arms (e.g., wheelchair or bedside chair)? 1  Help needed to walk in hospital room? 1  Help needed climbing 3-5 steps with a railing?  1  6 Click Score 8  Consider Recommendation of Discharge To: CIR/SNF/LTACH  Progressive Mobility  What is the highest level of mobility based on the progressive mobility assessment? Level 2 (Chairfast) - Balance while sitting  on edge of bed and cannot stand  Mobility Out of bed for toileting  PT Goal Progression   Progress towards PT goals Progressing toward goals  Acute Rehab PT Goals  PT Goal Formulation With patient  Time For Goal Achievement 05/01/21  Potential to Achieve Goals Fair  PT Time Calculation  PT Start Time (ACUTE ONLY) 1107  PT Stop Time (ACUTE ONLY) 1130  PT Time Calculation (min) (ACUTE ONLY) 23 min  PT General Charges  $$ ACUTE PT VISIT 1 Visit  PT Treatments  $Therapeutic Activity 23-37 mins

## 2021-04-27 NOTE — Progress Notes (Signed)
DAILY PROGRESS NOTE   Patient Name: Amber Herring Date of Encounter: 04/27/2021 Cardiologist: Pixie Casino, MD  Chief Complaint   More alert today  Patient Profile   61 yo female with history of obesity, depression and anemia presented with generalized swelling and cardiomegaly concerning for heart failure as well as atrial flutter on admission  Subjective   Amber Herring appears more alert today.  I decreased her diuretics and her metoprolol yesterday.  Heart rate has improved somewhat but remains bradycardic in the 50s.  She is maintaining sinus rhythm after cardioversion yesterday.  She had another liter negative and now is almost 40 L negative since admission.  Objective   Vitals:   04/26/21 1335 04/26/21 1512 04/26/21 2008 04/27/21 0417  BP: 92/67 98/69 (!) 94/59 97/62  Pulse: (!) 55 (!) 52 (!) 52 (!) 52  Resp: _0 Temp: 97.8 F (36.6 C)  (!) 97.5 F (36.4 C) 97.6 F (36.4 C)  TempSrc: Oral  Oral Oral  SpO2: 96%  94% 97%  Weight:    (!) 186.9 kg  Height:        Intake/Output Summary (Last 24 hours) at 04/27/2021 1156 Last data filed at 04/26/2021 2238 Gross per 24 hour  Intake 150 ml  Output 250 ml  Net -100 ml   Filed Weights   04/24/21 0444 04/26/21 0454 04/27/21 0417  Weight: (!) 159.7 kg (!) 160.1 kg (!) 186.9 kg    Physical Exam   General appearance: alert, no distress, and morbidly obese Neck: JVD - several cm above sternal notch, no carotid bruit, and thyroid not enlarged, symmetric, no tenderness/mass/nodules Lungs: diminished breath sounds bilaterally Heart: regular tachycardia Abdomen: obese, protuberant Extremities: edema 2+ bilateral woody stasis edema Pulses: 2+ and symmetric Skin: Skin color, texture, turgor normal. No rashes or lesions Neurologic: Grossly normal Psych: Pleasant  Inpatient Medications    Scheduled Meds:  (feeding supplement) PROSource Plus  30 mL Oral BID BM   amiodarone  200 mg Oral Daily   apixaban  5 mg  Oral BID   ferrous sulfate  325 mg Oral TID WC   furosemide  40 mg Intravenous Daily   influenza vac split quadrivalent PF  0.5 mL Intramuscular Tomorrow-1000   megestrol  40 mg Oral BID   metoprolol tartrate  25 mg Oral BID   pantoprazole  40 mg Oral Daily   potassium chloride  20 mEq Oral TID   Ensure Max Protein  11 oz Oral Daily   sodium chloride flush  3 mL Intravenous Q12H    Continuous Infusions:  sodium chloride      PRN Meds: sodium chloride, acetaminophen **OR** [DISCONTINUED] acetaminophen, alum & mag hydroxide-simeth, ondansetron (ZOFRAN) IV, oxyCODONE, sodium chloride flush, traMADol   Labs   Results for orders placed or performed during the hospital encounter of 04/16/21 (from the past 48 hour(s))  Basic metabolic panel     Status: Abnormal   Collection Time: 04/26/21  5:57 AM  Result Value Ref Range   Sodium 136 135 - 145 mmol/L   Potassium 4.2 3.5 - 5.1 mmol/L   Chloride 101 98 - 111 mmol/L   CO2 27 22 - 32 mmol/L   Glucose, Bld 93 70 - 99 mg/dL    Comment: Glucose reference range applies only to samples taken after fasting for at least 8 hours.   BUN 23 (H) 6 - 20 mg/dL   Creatinine, Ser 0.91 0.44 - 1.00 mg/dL   Calcium  9.0 8.9 - 10.3 mg/dL   GFR, Estimated >60 >60 mL/min    Comment: (NOTE) Calculated using the CKD-EPI Creatinine Equation (2021)    Anion gap 8 5 - 15    Comment: Performed at [facility], [place] [address] [place], [place] [address]  Protime-INR     Status: Abnormal   Collection Time: 04/26/21  5:57 AM  Result Value Ref Range   Prothrombin Time 17.0 (H) 11.4 - 15.2 seconds   INR 1.4 (H) 0.8 - 1.2    Comment: (NOTE) INR goal varies based on device and disease states. Performed at [facility], [place] [address] [place], [place] [address]     ECG   N/A  Telemetry   Sinus bradycardia- Personally Reviewed  Radiology    ECHO TEE  Result Date: 04/26/2021    TRANSESOPHOGEAL ECHO REPORT    Patient Name:   Amber Herring Date of Exam: 04/26/2021 Medical Rec #:  [id number]        Height:       69.0 in Accession #:    [id number]       Weight:       353.0 lb Date of Birth:  1960/11/30        BSA:          2.630 m Patient Age:    50 years         BP:           101/65 mmHg Patient Gender: F                HR:           88 bpm. Exam Location:  Inpatient Procedure: Transesophageal Echo, Cardiac Doppler and Color Doppler Indications:     R94.31 Abnormal EKG  History:         Patient has prior history of Echocardiogram examinations.                  Cardiomegaly, Abnormal ECG, Arrythmias:Atrial Flutter;                  Signs/Symptoms:Chest Pain.  Sonographer:     Roseanna Rainbow RDCS Referring Phys:  [id number] Tami Lin [facility] Diagnosing Phys: Gwyndolyn Kaufman MD PROCEDURE: After discussion of the risks and benefits of a TEE, an informed consent was obtained from the patient. The transesophogeal probe was passed without difficulty through the esophogus of the patient. Imaged were obtained with the patient in a left lateral decubitus position. Sedation performed by different physician. The patient was monitored while under deep sedation. Anesthestetic sedation was provided intravenously by Anesthesiology: 282m of Propofol, 1081mof Lidocaine. The patient developed Respiratory depression during the procedure. A direct current cardioversion was performed at 200 joules with 1 attempt. IMPRESSIONS  1. Left ventricular ejection fraction, by estimation, is 45 to 50%. The left ventricle has low normal function.  2. Right ventricular systolic function is mildly reduced. The right ventricular size is moderately enlarged.  3. Left atrial size was mildly dilated. No left atrial/left atrial appendage thrombus was detected.  4. Right atrial size was severely dilated.  5. The mitral valve is normal in structure. Trivial mitral valve regurgitation.  6. There is severe tricuspid regurgitation likely due to annular dilation in the  setting of severe RA enlargement.  7. The aortic valve is tricuspid. Aortic valve regurgitation is trivial.  8. TEE was aborted early due to patient hypoxia during examination.  9. Following TEE and improvement  of hypoxia, patient was successfully cardioverted back to sinus bradycardia. FINDINGS  Left Ventricle: Left ventricular ejection fraction, by estimation, is 45 to 50%. The left ventricle has mildly decreased function. The left ventricular internal cavity size was normal in size. Right Ventricle: The right ventricular size is moderately enlarged. No increase in right ventricular wall thickness. Right ventricular systolic function is mildly reduced. Left Atrium: Left atrial size was mildly dilated. No left atrial/left atrial appendage thrombus was detected. Right Atrium: Right atrial size was severely dilated. Pericardium: There is no evidence of pericardial effusion. Mitral Valve: The mitral valve is normal in structure. There is mild thickening of the mitral valve leaflet(s). There is mild calcification of the mitral valve leaflet(s). Trivial mitral valve regurgitation. Tricuspid Valve: There is severe tricuspid regurgitation likely due to annular dilation in the setting of severe RA enlargement. The tricuspid valve is normal in structure. Tricuspid valve regurgitation is severe. Aortic Valve: The aortic valve is tricuspid. Aortic valve regurgitation is trivial. Pulmonic Valve: The pulmonic valve was normal in structure. Pulmonic valve regurgitation is trivial. Aorta: Aortic root could not be assessed. IAS/Shunts: The interatrial septum was not assessed. Gwyndolyn Kaufman MD Electronically signed by Gwyndolyn Kaufman MD Signature Date/Time: 04/26/2021/5:02:44 PM    Final (Updated)     Cardiac Studies   See echo  Assessment   Principal Problem:   Anasarca Active Problems:   Lower urinary tract infectious disease   Morbid obesity ([place])   Vaginal bleeding   Iron deficiency anemia due to chronic blood  loss   Cardiomegaly   Chest pain   Atrial flutter ([place])   CAP (community acquired pneumonia)   Pressure injury of skin   Plan   Anasarca -acute systolic congestive heart failure Ms. Lupita Shutter is massively volume overloaded and presents with acute on chronic edema and appears to have chronic venous stasis changes.  CT and chest x-ray demonstrated cardiomegaly and BNP is elevated suggestive of heart failure.  Echocardiogram is shows mildly reduced LVEF to 45-50%, global HK.  Underwent successful cardioversion yesterday with TEE that demonstrated EF around 50% however she became unstable during the procedure.  She was cardioverted and is maintaining sinus bradycardia. I have reduced her diuretics due to hypotension and will likely be able to switch her to oral Bumex 1 mg BID for better absorption and continued diuresis hopefully as outpatient. Will likely switch metoprolol to Toprol-XL 25 mg daily. Continue amiodarone 200 mg daily. Blood pressure would not allow additional CHF medications at this time. Atypical atrial flutter Ultimately was thought that she was in atypical atrial flutter and successfully cardioverted to a sinus bradycardia.  She has had continued bradycardia with hypotension despite decreasing her beta-blocker.  We may need to further decrease her beta-blocker.  She seems to be maintaining sinus rhythm. Community-acquired pneumonia Started on azithromycin and ceftriaxone for community-acquired pneumonia per hospital medicine. Vaginal bleeding Management as per GYN service  Time Spent Directly with Patient:  I have spent a total of 25 minutes with the patient reviewing hospital notes, telemetry, EKGs, labs and examining the patient as well as establishing an assessment and plan that was discussed personally with the patient.  > 50% of time was spent in direct patient care.  Length of Stay:  LOS: 10 days   Pixie Casino, MD, [facility], [place] Director of the [facility] Lipid [facility]  Cardiovascular [facility] Diplomate of the American Board of Clinical Lipidology Attending  Cardiologist  Direct Dial: [phone]   Fax: [phone]  Website:  [email or web address] 04/27/2021, 11:56 AM

## 2021-04-27 NOTE — Progress Notes (Signed)
PROGRESS NOTE    Amber Herring  KNL:[id number] DOB: 1960-10-17 DOA: 04/16/2021  PCP: Denita Lung, MD   Brief Narrative:  This 61 yrs old Morbidly obese female with PMH significant for depression, chronic anemia, GERD, and HLD who presented to the ED with c/o: generalized swelling primarily of the legs and abdomen. ROS was also positive for intermittent dysfunctional vaginal bleeding.  Patient was admitted for acute systolic CHF requiring IV diuresis.  Hospital course complicated by atrial fibrillation requiring amiodarone, Cardiology is consulted, GYN is also consulted for  vaginal bleeding, started on Megace and outpatient follow-up recommended.  Her hemoglobin remained stable, Patient is started on Eliquis.  She underwent successful  TEE guided cardioversion on 04/26/21.  Now she remains in sinus bradycardia.  Assessment & Plan:   Principal Problem:   Anasarca Active Problems:   Lower urinary tract infectious disease   Morbid obesity ([place])   Vaginal bleeding   Iron deficiency anemia due to chronic blood loss   Cardiomegaly   Chest pain   Atrial flutter (HCC)   CAP (community acquired pneumonia)   Pressure injury of skin  Acute on chronic combined systolic and diastolic CHF /Anasarca: Patient presented with generalized body swelling more pronounced in the legs and abdomen.   Echocardiogram shows LVEF 45 to 50% with global hypokinesis and mildly dilated PASP.  Cardiology consulted. Continued on aggressive diuresis, Net negative balance 38 L since admission. Continue daily weight, intake output charting. IV Lasix changed with Bumex 1 mg twice daily. Venous duplex negative for DVT.  Intake/Output Summary (Last 24 hours) at 04/27/2021 1404 Last data filed at 04/27/2021 0911 Gross per 24 hour  Intake --  Output 252 ml  Net -252 ml    Atrial Flutter /Ectopic atrial tachycardia EKG showed atrial flutter versus ectopic atrial tachycardia. This is contributing to the CHF  exacerbation. Patient is started on Eliquis.  She underwent TEE guided cardioversion on 04/26/2021. Now she remains in sinus bradycardia, metoprolol changed to Toprol-XL. Continue amiodarone, Eliquis, hold Toprol if heart rate drops further.  Hypokalemia: Replaced and resolved.  Hypomagnesemia: Replaced and resolved.  Morbid obesity: Diet and exercise discussed in detail  Vaginal bleeding: H&H remained stable. Pelvic ultrasound without acute findings, not able to visualize all structures. No evidence of ongoing bleeding presently. GYN consulted, started patient on Megace and recommended outpatient follow-up. She reports vaginal bleeding has improved.  Iron deficiency anemia: Patient has received IV iron therapy. H / H stable.  UTI: No clear signs and symptoms to suggest  UTI. Continue to monitor off antibiotics.  Pneumonia ruled out.: Normal procalcitonin, normal WBCs, Antibiotic discontinued.  DVT prophylaxis: SCDs Code Status: Full code. Family Communication:  No family at bed side. Disposition Plan:   Admitted for generalized anasarca /  CHF exacerbation requiring IV diuresis. Hospital course complicated by atrial fibrillation, continuous dysfunctional uterine bleeding. Patient is started on Megace for dysfunctional uterine bleeding, DUB resolved. Patient started on Eliquis for A. Fib.  Underwent successful TEE guided cardioversion,   Anticipated discharge to skilled nursing facility once insurance approves placement.  Consultants:  Cardiology  Procedures: Echocardiogram. Antimicrobials:   Anti-infectives (From admission, onward)    Start     Dose/Rate Route Frequency Ordered Stop   04/17/21 2100  cefTRIAXone (ROCEPHIN) 1 g in sodium chloride 0.9 % 100 mL IVPB  Status:  Discontinued        1 g 200 mL/hr over 30 Minutes Intravenous Every 24 hours 04/16/21 2129 04/17/21 1821  04/17/21 0145  azithromycin (ZITHROMAX) 500 mg in sodium chloride 0.9 % 250 mL IVPB   Status:  Discontinued        500 mg 250 mL/hr over 60 Minutes Intravenous Every 24 hours 04/17/21 0132 04/17/21 1821   04/16/21 2045  cefTRIAXone (ROCEPHIN) 1 g in sodium chloride 0.9 % 100 mL IVPB        1 g 200 mL/hr over 30 Minutes Intravenous  Once 04/16/21 2042 04/16/21 2130       Subjective: Patient was seen and examined at bedside. Overnight events noted.   Patient reports feeling better, states she is feeling nauseous after the procedure yesterday. She reports vaginal bleeding has resolved.  She reports having bilateral knee pain, asks for strong pain medications  Objective: Vitals:   04/26/21 2008 04/27/21 0417 04/27/21 1158 04/27/21 1238  BP: (!) 94/59 97/62 106/64 113/69  Pulse: (!) 52 (!) 52 (!) 52 (!) 50  Resp: _0 Temp: (!) 97.5 F (36.4 C) 97.6 F (36.4 C)  (!) 97.5 F (36.4 C)  TempSrc: Oral Oral  Oral  SpO2: 94% 97%  100%  Weight:  (!) 186.9 kg    Height:        Intake/Output Summary (Last 24 hours) at 04/27/2021 1404 Last data filed at 04/27/2021 0911 Gross per 24 hour  Intake --  Output 252 ml  Net -252 ml   Filed Weights   04/24/21 0444 04/26/21 0454 04/27/21 0417  Weight: (!) 159.7 kg (!) 160.1 kg (!) 186.9 kg    Examination:  General exam: Appears comfortable, deconditioned, morbidly obese, not in any distress. Respiratory system: Clear to auscultation bilaterally, no wheezing, no crackles. Cardiovascular system: S1-S2 heard, regular rate and rhythm, no murmur. Gastrointestinal system: Abdomen is soft, mildly distended, nontender, BS+ Central nervous system: Alert and oriented x 3 . No focal neurological deficits. Extremities: Bilateral pedal edema+, no cyanosis, no clubbing. Skin: No rashes, lesions or ulcers. Psychiatry: Judgement and insight appear normal. Mood & affect appropriate.     Data Reviewed: I have personally reviewed following labs and imaging studies  CBC: Recent Labs  Lab 04/21/21 0505 04/22/21 0542 04/24/21 0554  04/27/21 1229  WBC 5.7 6.1 7.1 6.9  HGB 10.9* 11.3* 11.7* 12.6  HCT 37.7 38.7 40.2 43.4  MCV 77.3* 77.1* 77.5* 79.5*  PLT 345 385 414* 725*   Basic Metabolic Panel: Recent Labs  Lab 04/23/21 0529 04/24/21 0554 04/25/21 0547 04/26/21 0557 04/27/21 1229  NA 137 135 136 136 135  K 4.4 4.1 4.3 4.2 5.2*  CL 98 98 100 101 97*  CO2 _1 GLUCOSE 115* 144* 92 93 121*  BUN 23* 22* 23* 23* 34*  CREATININE 0.97 0.98 0.92 0.91 1.17*  CALCIUM 9.3 9.2 9.3 9.0 9.3   GFR: Estimated Creatinine Clearance: 94.7 mL/min (A) (by C-G formula based on SCr of 1.17 mg/dL (H)). Liver Function Tests: No results for input(s): AST, ALT, ALKPHOS, BILITOT, PROT, ALBUMIN in the last 168 hours.  No results for input(s): LIPASE, AMYLASE in the last 168 hours. No results for input(s): AMMONIA in the last 168 hours. Coagulation Profile: Recent Labs  Lab 04/26/21 0557  INR 1.4*   Cardiac Enzymes: No results for input(s): CKTOTAL, CKMB, CKMBINDEX, TROPONINI in the last 168 hours. BNP (last 3 results) No results for input(s): PROBNP in the last 8760 hours. HbA1C: No results for input(s): HGBA1C in the last 72 hours. CBG: No results for input(s): GLUCAP in  the last 168 hours. Lipid Profile: No results for input(s): CHOL, HDL, LDLCALC, TRIG, CHOLHDL, LDLDIRECT in the last 72 hours. Thyroid Function Tests: No results for input(s): TSH, T4TOTAL, FREET4, T3FREE, THYROIDAB in the last 72 hours. Anemia Panel: No results for input(s): VITAMINB12, FOLATE, FERRITIN, TIBC, IRON, RETICCTPCT in the last 72 hours. Sepsis Labs: No results for input(s): PROCALCITON, LATICACIDVEN in the last 168 hours.   No results found for this or any previous visit (from the past 240 hour(s)).   Radiology Studies: ECHO TEE  Result Date: 04/26/2021    TRANSESOPHOGEAL ECHO REPORT   Patient Name:   CHELSIA SERRES Date of Exam: 04/26/2021 Medical Rec #:  [id number]        Height:       69.0 in Accession #:    [id number]        Weight:       353.0 lb Date of Birth:  [birth year]        BSA:          2.630 m Patient Age:    7 years         BP:           101/65 mmHg Patient Gender: F                HR:           88 bpm. Exam Location:  Inpatient Procedure: Transesophageal Echo, Cardiac Doppler and Color Doppler Indications:     R94.31 Abnormal EKG  History:         Patient has prior history of Echocardiogram examinations.                  Cardiomegaly, Abnormal ECG, Arrythmias:Atrial Flutter;                  Signs/Symptoms:Chest Pain.  Sonographer:     Roseanna Rainbow RDCS Referring Phys:  [id number] Tami Lin [facility] Diagnosing Phys: Gwyndolyn Kaufman MD PROCEDURE: After discussion of the risks and benefits of a TEE, an informed consent was obtained from the patient. The transesophogeal probe was passed without difficulty through the esophogus of the patient. Imaged were obtained with the patient in a left lateral decubitus position. Sedation performed by different physician. The patient was monitored while under deep sedation. Anesthestetic sedation was provided intravenously by Anesthesiology: 230m of Propofol, 1044mof Lidocaine. The patient developed Respiratory depression during the procedure. A direct current cardioversion was performed at 200 joules with 1 attempt. IMPRESSIONS  1. Left ventricular ejection fraction, by estimation, is 45 to 50%. The left ventricle has low normal function.  2. Right ventricular systolic function is mildly reduced. The right ventricular size is moderately enlarged.  3. Left atrial size was mildly dilated. No left atrial/left atrial appendage thrombus was detected.  4. Right atrial size was severely dilated.  5. The mitral valve is normal in structure. Trivial mitral valve regurgitation.  6. There is severe tricuspid regurgitation likely due to annular dilation in the setting of severe RA enlargement.  7. The aortic valve is tricuspid. Aortic valve regurgitation is trivial.  8. TEE was aborted early due to  patient hypoxia during examination.  9. Following TEE and improvement of hypoxia, patient was successfully cardioverted back to sinus bradycardia. FINDINGS  Left Ventricle: Left ventricular ejection fraction, by estimation, is 45 to 50%. The left ventricle has mildly decreased function. The left ventricular internal cavity size was normal in size. Right Ventricle: The right ventricular size is moderately  enlarged. No increase in right ventricular wall thickness. Right ventricular systolic function is mildly reduced. Left Atrium: Left atrial size was mildly dilated. No left atrial/left atrial appendage thrombus was detected. Right Atrium: Right atrial size was severely dilated. Pericardium: There is no evidence of pericardial effusion. Mitral Valve: The mitral valve is normal in structure. There is mild thickening of the mitral valve leaflet(s). There is mild calcification of the mitral valve leaflet(s). Trivial mitral valve regurgitation. Tricuspid Valve: There is severe tricuspid regurgitation likely due to annular dilation in the setting of severe RA enlargement. The tricuspid valve is normal in structure. Tricuspid valve regurgitation is severe. Aortic Valve: The aortic valve is tricuspid. Aortic valve regurgitation is trivial. Pulmonic Valve: The pulmonic valve was normal in structure. Pulmonic valve regurgitation is trivial. Aorta: Aortic root could not be assessed. IAS/Shunts: The interatrial septum was not assessed. Gwyndolyn Kaufman MD Electronically signed by Gwyndolyn Kaufman MD Signature Date/Time: 04/26/2021/5:02:44 PM    Final (Updated)     Scheduled Meds:  (feeding supplement) PROSource Plus  30 mL Oral BID BM   amiodarone  200 mg Oral Daily   apixaban  5 mg Oral BID   bumetanide  1 mg Oral BID   ferrous sulfate  325 mg Oral TID WC   influenza vac split quadrivalent PF  0.5 mL Intramuscular Tomorrow-1000   megestrol  40 mg Oral BID   [START ON 04/28/2021] metoprolol succinate  25 mg Oral Daily    pantoprazole  40 mg Oral Daily   Ensure Max Protein  11 oz Oral Daily   sodium chloride flush  3 mL Intravenous Q12H   Continuous Infusions:  sodium chloride       LOS: 10 days    Time spent: 35 min    Haneef Hallquist, MD [facility]   If 7PM-7AM, please contact night-coverage

## 2021-04-28 DIAGNOSIS — I5043 Acute on chronic combined systolic (congestive) and diastolic (congestive) heart failure: Secondary | ICD-10-CM

## 2021-04-28 LAB — CBC
HCT: 40.6 % (ref 36.0–46.0)
Hemoglobin: 11.9 g/dL — ABNORMAL LOW (ref 12.0–15.0)
MCH: 23.5 pg — ABNORMAL LOW (ref 26.0–34.0)
MCHC: 29.3 g/dL — ABNORMAL LOW (ref 30.0–36.0)
MCV: 80.1 fL (ref 80.0–100.0)
Platelets: 364 10*3/uL (ref 150–400)
RBC: 5.07 MIL/uL (ref 3.87–5.11)
RDW: 24.2 % — ABNORMAL HIGH (ref 11.5–15.5)
WBC: 7.1 10*3/uL (ref 4.0–10.5)
nRBC: 0 % (ref 0.0–0.2)

## 2021-04-28 LAB — PHOSPHORUS: Phosphorus: 4.8 mg/dL — ABNORMAL HIGH (ref 2.5–4.6)

## 2021-04-28 LAB — BASIC METABOLIC PANEL
Anion gap: 10 (ref 5–15)
BUN: 32 mg/dL — ABNORMAL HIGH (ref 6–20)
CO2: 24 mmol/L (ref 22–32)
Calcium: 9 mg/dL (ref 8.9–10.3)
Chloride: 99 mmol/L (ref 98–111)
Creatinine, Ser: 1.08 mg/dL — ABNORMAL HIGH (ref 0.44–1.00)
GFR, Estimated: 60 mL/min — ABNORMAL LOW (ref >60)
Glucose, Bld: 128 mg/dL — ABNORMAL HIGH (ref 70–99)
Potassium: 4.5 mmol/L (ref 3.5–5.1)
Sodium: 133 mmol/L — ABNORMAL LOW (ref 135–145)

## 2021-04-28 LAB — MAGNESIUM: Magnesium: 2.1 mg/dL (ref 1.7–2.4)

## 2021-04-28 NOTE — Plan of Care (Signed)
Pt reports feeling better this shift. Denies nausea, pain well-controlled. Problem: Education: Goal: Knowledge of General Education information will improve Description: Including pain rating scale, medication(s)/side effects and non-pharmacologic comfort measures Outcome: Progressing   Problem: Health Behavior/Discharge Planning: Goal: Ability to manage health-related needs will improve Outcome: Progressing   Problem: Clinical Measurements: Goal: Ability to maintain clinical measurements within normal limits will improve Outcome: Progressing Goal: Will remain free from infection Outcome: Progressing Goal: Diagnostic test results will improve Outcome: Progressing Goal: Respiratory complications will improve Outcome: Progressing Goal: Cardiovascular complication will be avoided Outcome: Progressing   Problem: Activity: Goal: Risk for activity intolerance will decrease Outcome: Progressing   Problem: Nutrition: Goal: Adequate nutrition will be maintained Outcome: Progressing   Problem: Coping: Goal: Level of anxiety will decrease Outcome: Progressing   Problem: Elimination: Goal: Will not experience complications related to bowel motility Outcome: Progressing Goal: Will not experience complications related to urinary retention Outcome: Progressing   Problem: Pain Managment: Goal: General experience of comfort will improve Outcome: Progressing   Problem: Safety: Goal: Ability to remain free from injury will improve Outcome: Progressing   Problem: Skin Integrity: Goal: Risk for impaired skin integrity will decrease Outcome: Progressing

## 2021-04-28 NOTE — Progress Notes (Signed)
[facility] PROGRESS NOTE    Progress Note  Amber Herring  YKZ:[id number] DOB: 1960-04-20 DOA: 04/16/2021 PCP: Denita Lung, MD     Brief Narrative:   Amber Herring is an 61 y.o. female past medical history of depression, GERD hyperlipidemia comes in complaining of lower extremity swelling and abdominal swelling.  Patient was admitted and treated for acute systolic heart failure Hospital course was complicated by atrial fibrillation requiring amiodarone cardiology consult and a GYN consult for vaginal bleeding for which she was started on Megace and told to follow-up with GYN as an outpatient.  Her hemoglobin has remained stable she was started on Eliquis underwent successful TEE cardioversion on 04/26/2021 now remains in sinus bradycardia     Assessment/Plan:   Acute atrial flutter/atrial ectopy: This was probably contributing to her heart failure exacerbation started on Eliquis underwent TEE cardioversion on 04/26/2021. Now on  amiodarone and Eliquis. Metoprolol dose has been reduced, now sinus rhythm bradycardia resolved.  Anasarca due to acute systolic heart failure: Cardiology has been consulted. They have reduced his diuretic therapy due to hypotension. Is improved, continue amiodarone, Toprol and Bumex.  Hyperkalemia: Treated now resolved her potassium was 4.5, try to keep potassium greater than 4 magnesium greater than 2.  Hypomagnesemia: Repleted now resolved.  Vaginal bleeding: Pelvic ultrasound is without acute findings. GYN was consulted recommended Megace and follow-up with them as an outpatient.  Iron deficiency anemia: She received IV iron.  Has remained relatively stable.  Sacral decubitus ulcer stage II present on admission: RN Pressure Injury Documentation: Pressure Injury 04/17/21 Thigh Left;Posterior;Upper Stage 2 -  Partial thickness loss of dermis presenting as a shallow open injury with a red, pink wound bed without slough. (Active)   04/17/21 1800  Location: Thigh  Location Orientation: Left;Posterior;Upper  Staging: Stage 2 -  Partial thickness loss of dermis presenting as a shallow open injury with a red, pink wound bed without slough.  Wound Description (Comments):   Present on Admission: Yes   Morbid obesity Estimated body mass index is 47.7 kg/m as calculated from the following:   Height as of this encounter: _0  (1.753 m).   Weight as of this encounter: 146.5 kg.    DVT prophylaxis: Eliquis Family Communication:none Status is: Inpatient  Remains inpatient appropriate because: Awaiting skilled nursing facility placement.   Code Status:     Code Status Orders  (From admission, onward)           Start     Ordered   04/17/21 0000  Full code  Continuous        04/16/21 2359           Code Status History     Date Active Date Inactive Code Status Order ID Comments User Context   01/25/2021 1923 01/27/2021 1858 Full Code [id number]  Elwyn Reach, MD ED         IV Access:   Peripheral IV   Procedures and diagnostic studies:   ECHO TEE  Result Date: 04/26/2021    TRANSESOPHOGEAL ECHO REPORT   Patient Name:   Amber Herring Date of Exam: 04/26/2021 Medical Rec #:  [id number]        Height:       69.0 in Accession #:    [id number]       Weight:       353.0 lb Date of Birth:  04-05-1961        BSA:  2.630 m Patient Age:    60 years         BP:           101/65 mmHg Patient Gender: F                HR:           88 bpm. Exam Location:  Inpatient Procedure: Transesophageal Echo, Cardiac Doppler and Color Doppler Indications:     R94.31 Abnormal EKG  History:         Patient has prior history of Echocardiogram examinations.                  Cardiomegaly, Abnormal ECG, Arrythmias:Atrial Flutter;                  Signs/Symptoms:Chest Pain.  Sonographer:     Roseanna Rainbow RDCS Referring Phys:  [id number] Tami Lin [facility] Diagnosing Phys: Gwyndolyn Kaufman MD PROCEDURE: After discussion of  the risks and benefits of a TEE, an informed consent was obtained from the patient. The transesophogeal probe was passed without difficulty through the esophogus of the patient. Imaged were obtained with the patient in a left lateral decubitus position. Sedation performed by different physician. The patient was monitored while under deep sedation. Anesthestetic sedation was provided intravenously by Anesthesiology: 251m of Propofol, 1013mof Lidocaine. The patient developed Respiratory depression during the procedure. A direct current cardioversion was performed at 200 joules with 1 attempt. IMPRESSIONS  1. Left ventricular ejection fraction, by estimation, is 45 to 50%. The left ventricle has low normal function.  2. Right ventricular systolic function is mildly reduced. The right ventricular size is moderately enlarged.  3. Left atrial size was mildly dilated. No left atrial/left atrial appendage thrombus was detected.  4. Right atrial size was severely dilated.  5. The mitral valve is normal in structure. Trivial mitral valve regurgitation.  6. There is severe tricuspid regurgitation likely due to annular dilation in the setting of severe RA enlargement.  7. The aortic valve is tricuspid. Aortic valve regurgitation is trivial.  8. TEE was aborted early due to patient hypoxia during examination.  9. Following TEE and improvement of hypoxia, patient was successfully cardioverted back to sinus bradycardia. FINDINGS  Left Ventricle: Left ventricular ejection fraction, by estimation, is 45 to 50%. The left ventricle has mildly decreased function. The left ventricular internal cavity size was normal in size. Right Ventricle: The right ventricular size is moderately enlarged. No increase in right ventricular wall thickness. Right ventricular systolic function is mildly reduced. Left Atrium: Left atrial size was mildly dilated. No left atrial/left atrial appendage thrombus was detected. Right Atrium: Right atrial size  was severely dilated. Pericardium: There is no evidence of pericardial effusion. Mitral Valve: The mitral valve is normal in structure. There is mild thickening of the mitral valve leaflet(s). There is mild calcification of the mitral valve leaflet(s). Trivial mitral valve regurgitation. Tricuspid Valve: There is severe tricuspid regurgitation likely due to annular dilation in the setting of severe RA enlargement. The tricuspid valve is normal in structure. Tricuspid valve regurgitation is severe. Aortic Valve: The aortic valve is tricuspid. Aortic valve regurgitation is trivial. Pulmonic Valve: The pulmonic valve was normal in structure. Pulmonic valve regurgitation is trivial. Aorta: Aortic root could not be assessed. IAS/Shunts: The interatrial septum was not assessed. HeGwyndolyn KaufmanD Electronically signed by HeGwyndolyn KaufmanD Signature Date/Time: 04/26/2021/5:02:44 PM    Final (Updated)      Medical Consultants:   None.  Subjective:    [name] has no new complaints.  Objective:    Vitals:   04/27/21 1158 04/27/21 1238 04/27/21 2140 04/28/21 0432  BP: 106/64 113/69 (!) 103/53 (!) 111/53  Pulse: (!) 52 (!) 50 (!) 59 61  Resp:  _0 Temp:  (!) 97.5 F (36.4 C) 98.8 F (37.1 C) 98.5 F (36.9 C)  TempSrc:  Oral Oral Oral  SpO2:  100% 100% 98%  Weight:    (!) 146.5 kg  Height:       SpO2: 98 % O2 Flow Rate (L/min): 2 L/min   Intake/Output Summary (Last 24 hours) at 04/28/2021 0926 Last data filed at 04/28/2021 0700 Gross per 24 hour  Intake 1002 ml  Output 2175 ml  Net -1173 ml   Filed Weights   04/26/21 0454 04/27/21 0417 04/28/21 0432  Weight: (!) 160.1 kg (!) 186.9 kg (!) 146.5 kg    Exam: General exam: In no acute distress. Respiratory system: Good air movement and clear to auscultation. Cardiovascular system: S1 & S2 heard, RRR. No JVD. Gastrointestinal system: Abdomen is nondistended, soft and nontender.  Extremities: No pedal edema. Skin: No  rashes, lesions or ulcers Psychiatry: Judgement and insight appear normal. Mood & affect appropriate.    Data Reviewed:    Labs: Basic Metabolic Panel: Recent Labs  Lab 04/24/21 0554 04/25/21 0547 04/26/21 0557 04/27/21 1229 04/27/21 1427 04/28/21 0441  NA 135 136 136 135  --  133*  K 4.1 4.3 4.2 5.2* 5.2* 4.5  CL 98 100 101 97*  --  99  CO2 _1 --  24  GLUCOSE 144* 92 93 121*  --  128*  BUN 22* 23* 23* 34*  --  32*  CREATININE 0.98 0.92 0.91 1.17*  --  1.08*  CALCIUM 9.2 9.3 9.0 9.3  --  9.0  MG  --   --   --   --   --  2.1  PHOS  --   --   --   --   --  4.8*   GFR Estimated Creatinine Clearance: 88.1 mL/min (A) (by C-G formula based on SCr of 1.08 mg/dL (H)). Liver Function Tests: No results for input(s): AST, ALT, ALKPHOS, BILITOT, PROT, ALBUMIN in the last 168 hours. No results for input(s): LIPASE, AMYLASE in the last 168 hours. No results for input(s): AMMONIA in the last 168 hours. Coagulation profile Recent Labs  Lab 04/26/21 0557  INR 1.4*   COVID-19 Labs  No results for input(s): DDIMER, FERRITIN, LDH, CRP in the last 72 hours.  Lab Results  Component Value Date   SARSCOV2NAA NEGATIVE 04/16/2021   [place] NEGATIVE 01/25/2021    CBC: Recent Labs  Lab 04/22/21 0542 04/24/21 0554 04/27/21 1229 04/28/21 0441  WBC 6.1 7.1 6.9 7.1  HGB 11.3* 11.7* 12.6 11.9*  HCT 38.7 40.2 43.4 40.6  MCV 77.1* 77.5* 79.5* 80.1  PLT 385 414* 444* 364   Cardiac Enzymes: No results for input(s): CKTOTAL, CKMB, CKMBINDEX, TROPONINI in the last 168 hours. BNP (last 3 results) No results for input(s): PROBNP in the last 8760 hours. CBG: No results for input(s): GLUCAP in the last 168 hours. D-Dimer: No results for input(s): DDIMER in the last 72 hours. Hgb A1c: No results for input(s): HGBA1C in the last 72 hours. Lipid Profile: No results for input(s): CHOL, HDL, LDLCALC, TRIG, CHOLHDL, LDLDIRECT in the last 72 hours. Thyroid function studies: No  results for input(s): TSH, T4TOTAL,  T3FREE, THYROIDAB in the last 72 hours.  Invalid input(s): FREET3 Anemia work up: No results for input(s): VITAMINB12, FOLATE, FERRITIN, TIBC, IRON, RETICCTPCT in the last 72 hours. Sepsis Labs: Recent Labs  Lab 04/22/21 0542 04/24/21 0554 04/27/21 1229 04/28/21 0441  WBC 6.1 7.1 6.9 7.1   Microbiology No results found for this or any previous visit (from the past 240 hour(s)).   Medications:    (feeding supplement) PROSource Plus  30 mL Oral BID BM   amiodarone  200 mg Oral Daily   apixaban  5 mg Oral BID   bumetanide  1 mg Oral BID   ferrous sulfate  325 mg Oral TID WC   influenza vac split quadrivalent PF  0.5 mL Intramuscular Tomorrow-1000   megestrol  40 mg Oral BID   metoprolol succinate  25 mg Oral Daily   pantoprazole  40 mg Oral Daily   Ensure Max Protein  11 oz Oral Daily   sodium chloride flush  3 mL Intravenous Q12H   Continuous Infusions:  sodium chloride        LOS: 11 days   Charlynne Cousins  [facility]  04/28/2021, 9:26 AM

## 2021-04-28 NOTE — Progress Notes (Signed)
Progress Note  Patient Name: Amber Herring Date of Encounter: 04/28/2021  [facility] Cardiologist: Pixie Casino, MD   Subjective   States she thinks her feet are swelling again, just received bumex.  Inpatient Medications    Scheduled Meds:  (feeding supplement) PROSource Plus  30 mL Oral BID BM   amiodarone  200 mg Oral Daily   apixaban  5 mg Oral BID   bumetanide  1 mg Oral BID   ferrous sulfate  325 mg Oral TID WC   influenza vac split quadrivalent PF  0.5 mL Intramuscular Tomorrow-1000   megestrol  40 mg Oral BID   metoprolol succinate  25 mg Oral Daily   pantoprazole  40 mg Oral Daily   Ensure Max Protein  11 oz Oral Daily   sodium chloride flush  3 mL Intravenous Q12H   Continuous Infusions:  sodium chloride     PRN Meds: sodium chloride, acetaminophen **OR** [DISCONTINUED] acetaminophen, alum & mag hydroxide-simeth, ondansetron (ZOFRAN) IV, oxyCODONE, sodium chloride flush, traMADol   Vital Signs    Vitals:   04/27/21 1158 04/27/21 1238 04/27/21 2140 04/28/21 0432  BP: 106/64 113/69 (!) 103/53 (!) 111/53  Pulse: (!) 52 (!) 50 (!) 59 61  Resp:  _0 Temp:  (!) 97.5 F (36.4 C) 98.8 F (37.1 C) 98.5 F (36.9 C)  TempSrc:  Oral Oral Oral  SpO2:  100% 100% 98%  Weight:    (!) 146.5 kg  Height:        Intake/Output Summary (Last 24 hours) at 04/28/2021 0921 Last data filed at 04/28/2021 0700 Gross per 24 hour  Intake 1002 ml  Output 2175 ml  Net -1173 ml   Last 3 Weights 04/28/2021 04/27/2021 04/26/2021  Weight (lbs) 323 lb 412 lb 352 lb 15.3 oz  Weight (kg) 146.512 kg 186.882 kg 160.1 kg      Telemetry    Sinus in the 60s - Personally Reviewed  ECG    No new tracings - Personally Reviewed  Physical Exam   GEN: No acute distress, morbidly obese   Neck: No JVD Cardiac: RRR, no murmurs, rubs, or gallops.  Respiratory: Clear to auscultation bilaterally. GI: Soft, nontender, non-distended  MS: mild LE edema; No deformity. Neuro:   Nonfocal  Psych: Normal affect   Labs    High Sensitivity Troponin:   Recent Labs  Lab 04/16/21 1721 04/16/21 1827  TROPONINIHS 13 12     Chemistry Recent Labs  Lab 04/26/21 0557 04/27/21 1229 04/27/21 1427 04/28/21 0441  NA 136 135  --  133*  K 4.2 5.2* 5.2* 4.5  CL 101 97*  --  99  CO2 27 25  --  24  GLUCOSE 93 121*  --  128*  BUN 23* 34*  --  32*  CREATININE 0.91 1.17*  --  1.08*  CALCIUM 9.0 9.3  --  9.0  MG  --   --   --  2.1  GFRNONAA >60 54*  --  60*  ANIONGAP 8 13  --  10    Lipids No results for input(s): CHOL, TRIG, HDL, LABVLDL, LDLCALC, CHOLHDL in the last 168 hours.  Hematology Recent Labs  Lab 04/24/21 0554 04/27/21 1229 04/28/21 0441  WBC 7.1 6.9 7.1  RBC 5.19* 5.46* 5.07  HGB 11.7* 12.6 11.9*  HCT 40.2 43.4 40.6  MCV 77.5* 79.5* 80.1  MCH 22.5* 23.1* 23.5*  MCHC 29.1* 29.0* 29.3*  RDW 22.8* 24.0* 24.2*  PLT  414* 444* 364   Thyroid No results for input(s): TSH, FREET4 in the last 168 hours.  BNPNo results for input(s): BNP, PROBNP in the last 168 hours.  DDimer No results for input(s): DDIMER in the last 168 hours.   Radiology    ECHO TEE  Result Date: 04/26/2021    TRANSESOPHOGEAL ECHO REPORT   Patient Name:   Amber Herring Date of Exam: 04/26/2021 Medical Rec #:  [id number]        Height:       69.0 in Accession #:    [id number]       Weight:       353.0 lb Date of Birth:  09/20/1960        BSA:          2.630 m Patient Age:    61 years         BP:           101/65 mmHg Patient Gender: F                HR:           88 bpm. Exam Location:  Inpatient Procedure: Transesophageal Echo, Cardiac Doppler and Color Doppler Indications:     R94.31 Abnormal EKG  History:         Patient has prior history of Echocardiogram examinations.                  Cardiomegaly, Abnormal ECG, Arrythmias:Atrial Flutter;                  Signs/Symptoms:Chest Pain.  Sonographer:     Roseanna Rainbow RDCS Referring Phys:  [id number] Tami Lin Leslieanne Cobarrubias Diagnosing Phys: Gwyndolyn Kaufman MD PROCEDURE: After discussion of the risks and benefits of a TEE, an informed consent was obtained from the patient. The transesophogeal probe was passed without difficulty through the esophogus of the patient. Imaged were obtained with the patient in a left lateral decubitus position. Sedation performed by different physician. The patient was monitored while under deep sedation. Anesthestetic sedation was provided intravenously by Anesthesiology: 272m of Propofol, 1063mof Lidocaine. The patient developed Respiratory depression during the procedure. A direct current cardioversion was performed at 200 joules with 1 attempt. IMPRESSIONS  1. Left ventricular ejection fraction, by estimation, is 45 to 50%. The left ventricle has low normal function.  2. Right ventricular systolic function is mildly reduced. The right ventricular size is moderately enlarged.  3. Left atrial size was mildly dilated. No left atrial/left atrial appendage thrombus was detected.  4. Right atrial size was severely dilated.  5. The mitral valve is normal in structure. Trivial mitral valve regurgitation.  6. There is severe tricuspid regurgitation likely due to annular dilation in the setting of severe RA enlargement.  7. The aortic valve is tricuspid. Aortic valve regurgitation is trivial.  8. TEE was aborted early due to patient hypoxia during examination.  9. Following TEE and improvement of hypoxia, patient was successfully cardioverted back to sinus bradycardia. FINDINGS  Left Ventricle: Left ventricular ejection fraction, by estimation, is 45 to 50%. The left ventricle has mildly decreased function. The left ventricular internal cavity size was normal in size. Right Ventricle: The right ventricular size is moderately enlarged. No increase in right ventricular wall thickness. Right ventricular systolic function is mildly reduced. Left Atrium: Left atrial size was mildly dilated. No left atrial/left atrial appendage thrombus was  detected. Right Atrium: Right atrial size was severely dilated. Pericardium: There  is no evidence of pericardial effusion. Mitral Valve: The mitral valve is normal in structure. There is mild thickening of the mitral valve leaflet(s). There is mild calcification of the mitral valve leaflet(s). Trivial mitral valve regurgitation. Tricuspid Valve: There is severe tricuspid regurgitation likely due to annular dilation in the setting of severe RA enlargement. The tricuspid valve is normal in structure. Tricuspid valve regurgitation is severe. Aortic Valve: The aortic valve is tricuspid. Aortic valve regurgitation is trivial. Pulmonic Valve: The pulmonic valve was normal in structure. Pulmonic valve regurgitation is trivial. Aorta: Aortic root could not be assessed. IAS/Shunts: The interatrial septum was not assessed. Gwyndolyn Kaufman MD Electronically signed by Gwyndolyn Kaufman MD Signature Date/Time: 04/26/2021/5:02:44 PM    Final (Updated)     Cardiac Studies   TEE-DCCV 04/26/21:  1. Left ventricular ejection fraction, by estimation, is 45 to 50%. The  left ventricle has low normal function.   2. Right ventricular systolic function is mildly reduced. The right  ventricular size is moderately enlarged.   3. Left atrial size was mildly dilated. No left atrial/left atrial  appendage thrombus was detected.   4. Right atrial size was severely dilated.   5. The mitral valve is normal in structure. Trivial mitral valve  regurgitation.   6. There is severe tricuspid regurgitation likely due to annular dilation  in the setting of severe RA enlargement.   7. The aortic valve is tricuspid. Aortic valve regurgitation is trivial.   8. TEE was aborted early due to patient hypoxia during examination.   9. Following TEE and improvement of hypoxia, patient was successfully  cardioverted back to sinus bradycardia.   Patient Profile     61 y.o. female with history of obesity, depression and anemia presented with  generalized swelling and cardiomegaly concerning for heart failure as well as atrial flutter on admission. She is now s/p TEE-guided cardioversion and is maintaining sinus rhythm.  Assessment & Plan    Mildly reduced EF - 45-50%, stable from prior Severely dilated right atrium Sever TR - pt presented exquisitely volume overloaded and has been aggressively diuresed - now maintained on 1 mg bumex BID - suspect a component of chronic venous stasis - monitor BP - may be able to add low dose spironolactone today - will need to monitor renal function and potassium   Atypical Atrial flutter --> TEE-guided DCCV 04/26/21 --> sinus to sinus bradycardia, now in the 60s Chronic anticoagulation - continue eliquis 5 mg BID - continues to maintain sinus rhythm in the 60s - continue amiodarone and metoprolol   CAP - ABX stopped, ruled out for pneumonia   Vaginal bleeding - per GYN   I  have arranged cardiology follow up with Dr. Lysbeth Penner team.    For questions or updates, please contact [facility] Please consult [email or web address] for contact info under        Signed, Ledora Bottcher, PA  04/28/2021, 9:21 AM

## 2021-04-28 NOTE — Progress Notes (Signed)
Occupational Therapy Treatment Patient Details Name: Amber Herring MRN: [id number] DOB: 08-16-1960 Today's Date: 04/28/2021   History of present illness Patient presents to the ER 04/16/21 with chief complaint of shortness of breath and swelling of her lower abdomen and bilateral lower extremities, anasarca. PMH: recent left hand fx-3 months ago), GERD, obesity, metabolic syndrome, anxiety.   OT comments  Patient motivated to get to chair this session. Needing increased time and use of gait belt at end of bed to assist with uprighting trunk and min A from OT to slide leg off edge of mattress. Patient needing increased time and provide verbal cues for hand placement to pivot to recliner chair. On first attempt patient fatigue and needing to sit back onto bed. With second attempt OT provide min A at hips to stabilize and intermittent mod A to mobilize L leg while turning to sit into chair. Acute OT to follow.   Recommendations for follow up therapy are one component of a multi-disciplinary discharge planning process, led by the attending physician.  Recommendations may be updated based on patient status, additional functional criteria and insurance authorization.    Follow Up Recommendations  Skilled nursing-short term rehab (<3 hours/day)    Assistance Recommended at Discharge Frequent or constant Supervision/Assistance  Patient can return home with the following  Two people to help with walking and/or transfers;Two people to help with bathing/dressing/bathroom;Help with stairs or ramp for entrance   Equipment Recommendations  Other (comment) (bari 3N1)       Precautions / Restrictions Precautions Precautions: Fall Precaution Comments: B knee Arthritis and L hand splint from Fx approx a month ago (fall) Required Braces or Orthoses: Splint/Cast Splint/Cast: has a splint for Left hand - for heavier ADL and hand use. Restrictions Other Position/Activity Restrictions: per outpatient OT  notes - use of splint with heavier tasks but otherwise is WBAT L UE.       Mobility Bed Mobility Overal bed mobility: Needs Assistance Bed Mobility: Supine to Sit     Supine to sit: Min assist     General bed mobility comments: Attached gait belt to end of bed for patient to pull up on to upright trunk. Needing min A with legs to slide off mattress    Transfers Overall transfer level: Needs assistance Equipment used: None Transfers: Bed to chair/wheelchair/BSC   Stand pivot transfers: Min assist;From elevated surface         General transfer comment: Patient needing two attempts for stand pivot to recliner chair. Verbal cues for hand placement to assist with turning toward chair. Min A provided at hips for stabilization and up to mod A with mobilizing L LE due to painful knee. Needing increased time to perform pivot     Balance Overall balance assessment: Needs assistance Sitting-balance support: Feet supported Sitting balance-Leahy Scale: Fair     Standing balance support: Bilateral upper extremity supported Standing balance-Leahy Scale: Poor Standing balance comment: Reliant on external support                                Cognition Arousal/Alertness: Awake/alert Behavior During Therapy: WFL for tasks assessed/performed Overall Cognitive Status: Within Functional Limits for tasks assessed  Pertinent Vitals/ Pain       Pain Assessment: Faces Faces Pain Scale: Hurts even more Pain Location: B knees (Arthritis) Pain Descriptors / Indicators: Grimacing;Aching Pain Intervention(s): Monitored during session         Frequency  Min 2X/week        Progress Toward Goals  OT Goals(current goals can now be found in the care plan section)  Progress towards OT goals: Progressing toward goals  Acute Rehab OT Goals Patient Stated Goal: Get to chair OT Goal Formulation:  With patient Time For Goal Achievement: 05/02/21 Potential to Achieve Goals: Good ADL Goals Pt Will Perform Lower Body Dressing: with supervision;with adaptive equipment;sit to/from stand Pt Will Transfer to Toilet: with supervision;regular height toilet;grab bars;ambulating Pt Will Perform Toileting - Clothing Manipulation and hygiene: with supervision;sit to/from stand Pt/caregiver will Perform Home Exercise Program: Left upper extremity;With theraputty Additional ADL Goal #1: Patient will stand at sink to perform grooming task as evidence of improving activity tolerance  Plan Discharge plan remains appropriate       AM-PAC OT "6 Clicks" Daily Activity     Outcome Measure   Help from another person eating meals?: None Help from another person taking care of personal grooming?: A Little Help from another person toileting, which includes using toliet, bedpan, or urinal?: Total Help from another person bathing (including washing, rinsing, drying)?: A Lot Help from another person to put on and taking off regular upper body clothing?: A Little Help from another person to put on and taking off regular lower body clothing?: Total 6 Click Score: 14    End of Session Equipment Utilized During Treatment: Gait belt  OT Visit Diagnosis: Other abnormalities of gait and mobility (R26.89);Muscle weakness (generalized) (M62.81)   Activity Tolerance Patient tolerated treatment well   Patient Left in chair;with call bell/phone within reach   Nurse Communication Mobility status;Other (comment) (lift pad in chair)        Time: 3704-8889 OT Time Calculation (min): 38 min  Charges: OT General Charges $OT Visit: 1 Visit OT Treatments $Self Care/Home Management : 38-52 mins  Delbert Phenix OT OT pager: [phone]  Rosemary Holms 04/28/2021, 2:21 PM

## 2021-04-29 LAB — BASIC METABOLIC PANEL
Anion gap: 8 (ref 5–15)
BUN: 27 mg/dL — ABNORMAL HIGH (ref 6–20)
CO2: 26 mmol/L (ref 22–32)
Calcium: 9.2 mg/dL (ref 8.9–10.3)
Chloride: 99 mmol/L (ref 98–111)
Creatinine, Ser: 0.93 mg/dL (ref 0.44–1.00)
GFR, Estimated: >60 mL/min (ref >60)
Glucose, Bld: 140 mg/dL — ABNORMAL HIGH (ref 70–99)
Potassium: 4.2 mmol/L (ref 3.5–5.1)
Sodium: 133 mmol/L — ABNORMAL LOW (ref 135–145)

## 2021-04-29 LAB — MAGNESIUM: Magnesium: 2 mg/dL (ref 1.7–2.4)

## 2021-04-29 NOTE — TOC Progression Note (Signed)
Transition of Care [facility]) - Progression Note   Patient Details  Name: Amber Herring MRN: [id number] Date of Birth: [birth year]  Transition [facility]) CM/SW Purdy, LCSW Phone Number: 04/29/2021, 3:05 PM  Clinical Narrative: CSW faxed patient to [facility] and [facility] as these SNFs occasionally accept LOGs.  Expected Discharge Plan: Home/Self Care Barriers to Discharge: No Barriers Identified  Expected Discharge Plan and Services Expected Discharge Plan: Home/Self Care In-house Referral: Clinical Social Work Living arrangements for the past 2 months: Apartment  Readmission Risk Interventions No flowsheet data found.

## 2021-04-29 NOTE — Progress Notes (Signed)
[facility] PROGRESS NOTE    Progress Note  Amber Herring  FUX:[id number] DOB: 11/17/1960 DOA: 04/16/2021 PCP: Denita Lung, MD     Brief Narrative:   Amber Herring is an 61 y.o. female past medical history of depression, GERD hyperlipidemia comes in complaining of lower extremity swelling and abdominal swelling.  Patient was admitted and treated for acute systolic heart failure Hospital course was complicated by atrial fibrillation requiring amiodarone cardiology consult and a GYN consult for vaginal bleeding for which she was started on Megace and told to follow-up with GYN as an outpatient.  Her hemoglobin has remained stable she was started on Eliquis underwent successful TEE cardioversion on 04/26/2021 now remains in sinus bradycardia   Assessment/Plan:   Acute atrial flutter/atrial ectopy: This was probably contributing to her heart failure exacerbation started on Eliquis underwent TEE cardioversion on 04/26/2021. Now on metoprolol, amiodarone and Eliquis. Cardiology will sign off awaiting skilled nursing facility placement.  Anasarca due to acute systolic heart failure: Cardiology has been consulted. They have reduced his diuretic therapy due to hypotension. Is improved, continue amiodarone, Toprol and Bumex.  Hyperkalemia: Treated now resolved her potassium was 4.5, try to keep potassium greater than 4 magnesium greater than 2.  Hypomagnesemia: Repleted now resolved.  Vaginal bleeding: Pelvic ultrasound is without acute findings. GYN was consulted recommended Megace and follow-up with them as an outpatient.  Iron deficiency anemia: She received IV iron.  Has remained relatively stable.  Sacral decubitus ulcer stage II present on admission: RN Pressure Injury Documentation:    Morbid obesity Estimated body mass index is 47.7 kg/m as calculated from the following:   Height as of this encounter: _0  (1.753 m).   Weight as of this encounter: 146.5  kg.    DVT prophylaxis: Eliquis Family Communication:none Status is: Inpatient  Remains inpatient appropriate because: Awaiting skilled nursing facility placement.   Code Status:     Code Status Orders  (From admission, onward)           Start     Ordered   04/17/21 0000  Full code  Continuous        04/16/21 2359           Code Status History     Date Active Date Inactive Code Status Order ID Comments User Context   01/25/2021 1923 01/27/2021 1858 Full Code [id number]  Elwyn Reach, MD ED         IV Access:   Peripheral IV   Procedures and diagnostic studies:   No results found.   Medical Consultants:   None.   Subjective:    Amber Herring  No complaints  Objective:    Vitals:   04/28/21 0432 04/28/21 1215 04/28/21 2102 04/29/21 0514  BP: (!) 111/53 109/72 101/61 (!) 101/56  Pulse: 61 (!) 58 (!) 58 (!) 58  Resp: _1 Temp: 98.5 F (36.9 C) 98.1 F (36.7 C) 98.6 F (37 C) 98.5 F (36.9 C)  TempSrc: Oral Oral Oral   SpO2: 98% 97% 100% 96%  Weight: (!) 146.5 kg     Height:       SpO2: 96 % O2 Flow Rate (L/min): 2 L/min   Intake/Output Summary (Last 24 hours) at 04/29/2021 0929 Last data filed at 04/29/2021 0519 Gross per 24 hour  Intake 540 ml  Output 3025 ml  Net -2485 ml    Filed Weights   04/26/21 0454 04/27/21 0417 04/28/21 0432  Weight: (!) 160.1 kg (!) 186.9 kg (!) 146.5 kg    Exam: General exam: In no acute distress. Respiratory system: Good air movement and clear to auscultation. Cardiovascular system: S1 & S2 heard, RRR. No JVD. Gastrointestinal system: Abdomen is nondistended, soft and nontender.  Extremities: No pedal edema. Skin: No rashes, lesions or ulcers Psychiatry: Judgement and insight appear normal. Mood & affect appropriate.   Data Reviewed:    Labs: Basic Metabolic Panel: Recent Labs  Lab 04/25/21 0547 04/26/21 0557 04/27/21 1229 04/27/21 1427 04/28/21 0441  04/29/21 0831  NA 136 136 135  --  133* 133*  K 4.3 4.2 5.2*   < > 4.5 4.2  CL 100 101 97*  --  99 99  CO2 _0 --  24 26  GLUCOSE 92 93 121*  --  128* 140*  BUN 23* 23* 34*  --  32* 27*  CREATININE 0.92 0.91 1.17*  --  1.08* 0.93  CALCIUM 9.3 9.0 9.3  --  9.0 9.2  MG  --   --   --   --  2.1 2.0  PHOS  --   --   --   --  4.8*  --    < > = values in this interval not displayed.    GFR Estimated Creatinine Clearance: 102.3 mL/min (by C-G formula based on SCr of 0.93 mg/dL). Liver Function Tests: No results for input(s): AST, ALT, ALKPHOS, BILITOT, PROT, ALBUMIN in the last 168 hours. No results for input(s): LIPASE, AMYLASE in the last 168 hours. No results for input(s): AMMONIA in the last 168 hours. Coagulation profile Recent Labs  Lab 04/26/21 0557  INR 1.4*    COVID-19 Labs  No results for input(s): DDIMER, FERRITIN, LDH, CRP in the last 72 hours.  Lab Results  Component Value Date   SARSCOV2NAA NEGATIVE 04/16/2021   Marcus NEGATIVE 01/25/2021    CBC: Recent Labs  Lab 04/24/21 0554 04/27/21 1229 04/28/21 0441  WBC 7.1 6.9 7.1  HGB 11.7* 12.6 11.9*  HCT 40.2 43.4 40.6  MCV 77.5* 79.5* 80.1  PLT 414* 444* 364    Cardiac Enzymes: No results for input(s): CKTOTAL, CKMB, CKMBINDEX, TROPONINI in the last 168 hours. BNP (last 3 results) No results for input(s): PROBNP in the last 8760 hours. CBG: No results for input(s): GLUCAP in the last 168 hours. D-Dimer: No results for input(s): DDIMER in the last 72 hours. Hgb A1c: No results for input(s): HGBA1C in the last 72 hours. Lipid Profile: No results for input(s): CHOL, HDL, LDLCALC, TRIG, CHOLHDL, LDLDIRECT in the last 72 hours. Thyroid function studies: No results for input(s): TSH, T4TOTAL, T3FREE, THYROIDAB in the last 72 hours.  Invalid input(s): FREET3 Anemia work up: No results for input(s): VITAMINB12, FOLATE, FERRITIN, TIBC, IRON, RETICCTPCT in the last 72 hours. Sepsis Labs: Recent  Labs  Lab 04/24/21 0554 04/27/21 1229 04/28/21 0441  WBC 7.1 6.9 7.1    Microbiology No results found for this or any previous visit (from the past 240 hour(s)).   Medications:    (feeding supplement) PROSource Plus  30 mL Oral BID BM   amiodarone  200 mg Oral Daily   apixaban  5 mg Oral BID   bumetanide  1 mg Oral BID   ferrous sulfate  325 mg Oral TID WC   influenza vac split quadrivalent PF  0.5 mL Intramuscular Tomorrow-1000   megestrol  40 mg Oral BID   metoprolol succinate  25 mg Oral Daily  pantoprazole  40 mg Oral Daily   Ensure Max Protein  11 oz Oral Daily   sodium chloride flush  3 mL Intravenous Q12H   Continuous Infusions:  sodium chloride        LOS: 12 days   Charlynne Cousins  [facility]  04/29/2021, 9:29 AM

## 2021-04-30 ENCOUNTER — Inpatient Hospital Stay (HOSPITAL_COMMUNITY): Payer: Medicaid Other

## 2021-04-30 LAB — BASIC METABOLIC PANEL
Anion gap: 9 (ref 5–15)
BUN: 26 mg/dL — ABNORMAL HIGH (ref 6–20)
CO2: 25 mmol/L (ref 22–32)
Calcium: 9 mg/dL (ref 8.9–10.3)
Chloride: 98 mmol/L (ref 98–111)
Creatinine, Ser: 0.81 mg/dL (ref 0.44–1.00)
GFR, Estimated: >60 mL/min (ref >60)
Glucose, Bld: 95 mg/dL (ref 70–99)
Potassium: 4 mmol/L (ref 3.5–5.1)
Sodium: 132 mmol/L — ABNORMAL LOW (ref 135–145)

## 2021-04-30 LAB — MAGNESIUM: Magnesium: 2 mg/dL (ref 1.7–2.4)

## 2021-04-30 MED ORDER — OXYCODONE HCL 5 MG PO TABS: 10.0000 mg | ORAL_TABLET | ORAL | Status: DC | PRN

## 2021-04-30 NOTE — Progress Notes (Signed)
[facility] PROGRESS NOTE    Progress Note  Amber Herring  BOM:[id number] DOB: [birth year] DOA: 04/16/2021 PCP: Denita Lung, MD     Brief Narrative:   Amber Herring is an 61 y.o. female past medical history of depression, GERD hyperlipidemia comes in complaining of lower extremity swelling and abdominal swelling.  Patient was admitted and treated for acute systolic heart failure Hospital course was complicated by atrial fibrillation requiring amiodarone cardiology consult and a GYN consult for vaginal bleeding for which she was started on Megace and told to follow-up with GYN as an outpatient.  Her hemoglobin has remained stable she was started on Eliquis underwent successful TEE cardioversion on 04/26/2021 now remains in sinus bradycardia   Assessment/Plan:   Acute atrial flutter/atrial ectopy: This was probably contributing to her heart failure exacerbation started on Eliquis underwent TEE cardioversion on 04/26/2021. Now on metoprolol, amiodarone and Eliquis. Cardiology will sign off awaiting skilled nursing facility placement.  Anasarca due to acute systolic heart failure: Cardiology has been consulted. They have reduced his diuretic therapy due to hypotension. Is improved, continue amiodarone, Toprol and Bumex.  Hyperkalemia: Treated now resolved her potassium was 4.5, try to keep potassium greater than 4 magnesium greater than 2.  Hypomagnesemia: Repleted now resolved.  Vaginal bleeding: Pelvic ultrasound is without acute findings. GYN was consulted recommended Megace and follow-up with them as an outpatient.  Iron deficiency anemia: She received IV iron.  Has remained relatively stable.  Sacral decubitus ulcer stage II present on admission: RN Pressure Injury Documentation:    Morbid obesity Estimated body mass index is 47.85 kg/m as calculated from the following:   Height as of this encounter: 5' 9" (1.753 m).   Weight as of this encounter: 147  kg.    DVT prophylaxis: Eliquis Family Communication:none Status is: Inpatient  Remains inpatient appropriate because: Awaiting skilled nursing facility placement.   Code Status:     Code Status Orders  (From admission, onward)           Start     Ordered   04/17/21 0000  Full code  Continuous        04/16/21 2359           Code Status History     Date Active Date Inactive Code Status Order ID Comments User Context   01/25/2021 1923 01/27/2021 1858 Full Code [id number]  Elwyn Reach, MD ED         IV Access:   Peripheral IV   Procedures and diagnostic studies:   No results found.   Medical Consultants:   None.   Subjective:    Amber Herring  No complaints, sleepy this am  Objective:    Vitals:   04/29/21 1233 04/29/21 1412 04/29/21 2039 04/30/21 0524  BP: (!) 112/55 118/63 (!) 112/56 (!) 102/52  Pulse: (!) 57 (!) 58 (!) 55 (!) 55  Resp:  _0 Temp:  98.1 F (36.7 C) 98.4 F (36.9 C) 98.1 F (36.7 C)  TempSrc:   Oral Oral  SpO2:  99% 97% 93%  Weight:    (!) 147 kg  Height:       SpO2: 93 % O2 Flow Rate (L/min): 2 L/min   Intake/Output Summary (Last 24 hours) at 04/30/2021 0950 Last data filed at 04/30/2021 4619 Gross per 24 hour  Intake 1409 ml  Output 3150 ml  Net -1741 ml    Filed Weights   04/27/21 0417 04/28/21 0432 04/30/21  0524  Weight: (!) 186.9 kg (!) 146.5 kg (!) 147 kg    Exam: General exam: In no acute distress. Respiratory system: Good air movement and clear to auscultation. Cardiovascular system: S1 & S2 heard, RRR. No JVD. Gastrointestinal system: Abdomen is nondistended, soft and nontender.  Extremities: No pedal edema. Skin: No rashes, lesions or ulcers Psychiatry: Judgement and insight appear normal. Mood & affect appropriate.   Data Reviewed:    Labs: Basic Metabolic Panel: Recent Labs  Lab 04/26/21 0557 04/27/21 1229 04/27/21 1427 04/28/21 0441 04/29/21 0831 04/30/21 0828   NA 136 135  --  133* 133* 132*  K 4.2 5.2*   < > 4.5 4.2 4.0  CL 101 97*  --  99 99 98  CO2 27 25  --  _0 GLUCOSE 93 121*  --  128* 140* 95  BUN 23* 34*  --  32* 27* 26*  CREATININE 0.91 1.17*  --  1.08* 0.93 0.81  CALCIUM 9.0 9.3  --  9.0 9.2 9.0  MG  --   --   --  2.1 2.0 2.0  PHOS  --   --   --  4.8*  --   --    < > = values in this interval not displayed.    GFR Estimated Creatinine Clearance: 117.7 mL/min (by C-G formula based on SCr of 0.81 mg/dL). Liver Function Tests: No results for input(s): AST, ALT, ALKPHOS, BILITOT, PROT, ALBUMIN in the last 168 hours. No results for input(s): LIPASE, AMYLASE in the last 168 hours. No results for input(s): AMMONIA in the last 168 hours. Coagulation profile Recent Labs  Lab 04/26/21 0557  INR 1.4*    COVID-19 Labs  No results for input(s): DDIMER, FERRITIN, LDH, CRP in the last 72 hours.  Lab Results  Component Value Date   SARSCOV2NAA NEGATIVE 04/16/2021   [place] NEGATIVE 01/25/2021    CBC: Recent Labs  Lab 04/24/21 0554 04/27/21 1229 04/28/21 0441  WBC 7.1 6.9 7.1  HGB 11.7* 12.6 11.9*  HCT 40.2 43.4 40.6  MCV 77.5* 79.5* 80.1  PLT 414* 444* 364    Cardiac Enzymes: No results for input(s): CKTOTAL, CKMB, CKMBINDEX, TROPONINI in the last 168 hours. BNP (last 3 results) No results for input(s): PROBNP in the last 8760 hours. CBG: No results for input(s): GLUCAP in the last 168 hours. D-Dimer: No results for input(s): DDIMER in the last 72 hours. Hgb A1c: No results for input(s): HGBA1C in the last 72 hours. Lipid Profile: No results for input(s): CHOL, HDL, LDLCALC, TRIG, CHOLHDL, LDLDIRECT in the last 72 hours. Thyroid function studies: No results for input(s): TSH, T4TOTAL, T3FREE, THYROIDAB in the last 72 hours.  Invalid input(s): FREET3 Anemia work up: No results for input(s): VITAMINB12, FOLATE, FERRITIN, TIBC, IRON, RETICCTPCT in the last 72 hours. Sepsis Labs: Recent Labs  Lab  04/24/21 0554 04/27/21 1229 04/28/21 0441  WBC 7.1 6.9 7.1    Microbiology No results found for this or any previous visit (from the past 240 hour(s)).   Medications:    (feeding supplement) PROSource Plus  30 mL Oral BID BM   amiodarone  200 mg Oral Daily   apixaban  5 mg Oral BID   bumetanide  1 mg Oral BID   ferrous sulfate  325 mg Oral TID WC   influenza vac split quadrivalent PF  0.5 mL Intramuscular Tomorrow-1000   megestrol  40 mg Oral BID   metoprolol succinate  25 mg Oral Daily  pantoprazole  40 mg Oral Daily   Ensure Max Protein  11 oz Oral Daily   sodium chloride flush  3 mL Intravenous Q12H   Continuous Infusions:  sodium chloride        LOS: 13 days   Charlynne Cousins  [facility]  04/30/2021, 9:50 AM

## 2021-04-30 NOTE — Plan of Care (Signed)
No acute events overnight. Problem: Education: Goal: Knowledge of General Education information will improve Description: Including pain rating scale, medication(s)/side effects and non-pharmacologic comfort measures Outcome: Progressing   Problem: Health Behavior/Discharge Planning: Goal: Ability to manage health-related needs will improve Outcome: Progressing   Problem: Clinical Measurements: Goal: Ability to maintain clinical measurements within normal limits will improve Outcome: Progressing Goal: Will remain free from infection Outcome: Progressing Goal: Diagnostic test results will improve Outcome: Progressing Goal: Respiratory complications will improve Outcome: Progressing Goal: Cardiovascular complication will be avoided Outcome: Progressing   Problem: Activity: Goal: Risk for activity intolerance will decrease Outcome: Progressing   Problem: Nutrition: Goal: Adequate nutrition will be maintained Outcome: Progressing   Problem: Coping: Goal: Level of anxiety will decrease Outcome: Progressing   Problem: Elimination: Goal: Will not experience complications related to bowel motility Outcome: Progressing Goal: Will not experience complications related to urinary retention Outcome: Progressing   Problem: Pain Managment: Goal: General experience of comfort will improve Outcome: Progressing   Problem: Safety: Goal: Ability to remain free from injury will improve Outcome: Progressing   Problem: Skin Integrity: Goal: Risk for impaired skin integrity will decrease Outcome: Progressing

## 2021-04-30 NOTE — Progress Notes (Signed)
Physical Therapy Treatment Patient Details Name: Amber Herring MRN: [id number] DOB: [birth year] Today's Date: 04/30/2021   History of Present Illness Patient presents to the ER 04/16/21 with chief complaint of shortness of breath and swelling of her lower abdomen and bilateral lower extremities, anasarca. PMH: recent left hand fx-3 months ago), GERD, obesity, metabolic syndrome, anxiety.    PT Comments    Patient  sitting in recliner, legs elevated. Patient placed legs down, placed recliner  so patient could pull up on the bed rails. Patient attempted x 5, only able to clear buttocks x 1. Patient attributes to having gotten up to [facility] then recliner earlier . Continue mobility, standing/pivots.    Recommendations for follow up therapy are one component of a multi-disciplinary discharge planning process, led by the attending physician.  Recommendations may be updated based on patient status, additional functional criteria and insurance authorization.  Follow Up Recommendations  Skilled nursing-short term rehab (<3 hours/day)     Assistance Recommended at Discharge Frequent or constant Supervision/Assistance  Patient can return home with the following Two people to help with walking and/or transfers;Two people to help with bathing/dressing/bathroom;Assistance with cooking/housework;Assist for transportation   Equipment Recommendations  None recommended by PT    Recommendations for Other Services       Precautions / Restrictions Precautions Precautions: Fall Precaution Comments: B knee Arthritis and L hand splint from Fx approx a month ago (fall) Required Braces or Orthoses: Splint/Cast Splint/Cast: has a splint for Left hand - for heavier ADL and hand use. Restrictions Other Position/Activity Restrictions: per outpatient OT notes - use of splint with heavier tasks but otherwise is WBAT L UE.     Mobility  Bed Mobility               General bed mobility comments: in  recliner,    Transfers Overall transfer level: Needs assistance                 General transfer comment: placed recliner towards bed to use rails to pull to stand. Patient attempted x 5, barely able to clear buttocks.    Ambulation/Gait                   Stairs             Wheelchair Mobility    Modified Rankin (Stroke Patients Only)       Balance   Sitting-balance support: Feet supported Sitting balance-Leahy Scale: Fair Sitting balance - Comments: in reclinerm steady                                    Cognition Arousal/Alertness: Awake/alert                                     General Comments: AxO x 3 motivated and willing but limited by B knee pain and body habitus;        Exercises      General Comments        Pertinent Vitals/Pain Faces Pain Scale: Hurts even more Pain Location: B knees (Arthritis) Pain Descriptors / Indicators: Grimacing;Aching Pain Intervention(s): Monitored during session;Premedicated before session    Home Living                          Prior Function  PT Goals (current goals can now be found in the care plan section) Acute Rehab PT Goals Patient Stated Goal: get back on my feet PT Goal Formulation: With patient Time For Goal Achievement: 05/14/21 Potential to Achieve Goals: Fair Progress towards PT goals: Progressing toward goals    Frequency    Min 2X/week      PT Plan Current plan remains appropriate    Co-evaluation              AM-PAC PT "6 Clicks" Mobility   Outcome Measure  Help needed turning from your back to your side while in a flat bed without using bedrails?: A Lot Help needed moving from lying on your back to sitting on the side of a flat bed without using bedrails?: A Lot   Help needed standing up from a chair using your arms (e.g., wheelchair or bedside chair)?: Total Help needed to walk in hospital room?:  Total Help needed climbing 3-5 steps with a railing? : Total 6 Click Score: 7    End of Session   Activity Tolerance: Patient limited by pain Patient left: in chair;with call bell/phone within reach (lift pad underneath) Nurse Communication: Mobility status PT Visit Diagnosis: Unsteadiness on feet (R26.81);Difficulty in walking, not elsewhere classified (R26.2);Pain Pain - Right/Left: Right Pain - part of body: Knee     Time: 4081-4481 PT Time Calculation (min) (ACUTE ONLY): 33 min  Charges:  $Therapeutic Activity: 23-37 mins                     Amber Herring PT Acute Rehabilitation Services Pager [phone] Office [phone]    Amber Herring 04/30/2021, 4:04 PM

## 2021-05-01 MED ORDER — CAPSAICIN 0.025 % EX CREA
TOPICAL_CREAM | Freq: Two times a day (BID) | CUTANEOUS | Status: DC
  Filled 2021-05-01 (×2): qty 60

## 2021-05-01 NOTE — Plan of Care (Signed)

## 2021-05-01 NOTE — Progress Notes (Signed)
Occupational Therapy Treatment Patient Details Name: Amber Herring MRN: [id number] DOB: Jul 09, 1960 Today's Date: 05/01/2021   History of present illness Patient presents to the ER 04/16/21 with chief complaint of shortness of breath and swelling of her lower abdomen and bilateral lower extremities, anasarca. PMH: recent left hand fx-3 months ago), GERD, obesity, metabolic syndrome, anxiety.   OT comments  Treatment focused on functional mobility - as needed to progress ADLs to out of bed and out of seated position. Mod assist to transfer to side of bed to assist with pivoting hips - patient used rails and gait belt attached to foot board. Patient stood and took steps with EVA walk till in front of recliner (patient had less fear with the EVA walker) - with two people there for safety. Technically only min guard but also providing counter weight on the EVA as patient pulling herself on it. In front of recliner therapist switched out EVA with bariwalker and patient worked on using upper body strength and weight bearing more through legs to advance feet forward - two times each. Had more difficulty with RLE due to knee pain. Patient motivated but fearful. Continue POC.   Recommendations for follow up therapy are one component of a multi-disciplinary discharge planning process, led by the attending physician.  Recommendations may be updated based on patient status, additional functional criteria and insurance authorization.    Follow Up Recommendations  Skilled nursing-short term rehab (<3 hours/day)    Assistance Recommended at Discharge Frequent or constant Supervision/Assistance  Patient can return home with the following  Two people to help with walking and/or transfers;Two people to help with bathing/dressing/bathroom;Help with stairs or ramp for entrance   Equipment Recommendations   ([facility]ari [facility])    Recommendations for Other Services      Precautions / Restrictions  Precautions Precautions: Fall Precaution Comments: B knee Arthritis and L hand splint from Fx approx a month ago (fall) Required Braces or Orthoses: Splint/Cast Splint/Cast: has a splint for Left hand - for heavier ADL and hand use. Restrictions Other Position/Activity Restrictions: per outpatient OT notes - use of splint with heavier tasks but otherwise is WBAT L UE.       Mobility Bed Mobility Overal bed mobility: Needs Assistance Bed Mobility: Supine to Sit Rolling: Mod assist   Supine to sit: Mod assist;HOB elevated;+2 for safety/equipment     General bed mobility comments: Patient used bed rails and gait belt fastened to end of bed to assist with pulling herself up. Needed mod assist with use of bed pad to assist with pivoting hips.    Transfers Overall transfer level: Needs assistance Equipment used: Rolling walker (2 wheels) (and EVA walker) Transfers: Sit to/from Stand;Bed to chair/wheelchair/BSC Sit to Stand: Min guard;From elevated surface;+2 safety/equipment Stand pivot transfers: Min guard         General transfer comment: Min guard to stand with two people standing by for safety and providing a counterweight on the EVA walker. Patient able to take steps to stand in front of the recliner. EVA exchanged with bariwalker and patient worked on standing erect with walker and advancing each leg with UE support. Had more difficulty weight bearing through RLE due to knee pain but able to step forward twice with each leg.     Balance           Standing balance support: Reliant on assistive device for balance Standing balance-Leahy Scale: Poor Standing balance comment: Reliant on external support  ADL either performed or assessed with clinical judgement   ADL                                              Extremity/Trunk Assessment              Vision       Perception     Praxis      Cognition  Arousal/Alertness: Awake/alert Behavior During Therapy: WFL for tasks assessed/performed Overall Cognitive Status: Within Functional Limits for tasks assessed                                 General Comments: AxO x 3 motivated and willing but limited by B knee pain and body habitus;          Exercises     Shoulder Instructions       General Comments      Pertinent Vitals/ Pain       Pain Assessment: Faces Faces Pain Scale: Hurts little more Pain Location: B knees (Arthritis) (R > L) Pain Descriptors / Indicators: Grimacing;Aching Pain Intervention(s): Limited activity within patient's tolerance  Home Living                                          Prior Functioning/Environment              Frequency  Min 2X/week        Progress Toward Goals  OT Goals(current goals can now be found in the care plan section)  Progress towards OT goals: Progressing toward goals  Acute Rehab OT Goals Patient Stated Goal: to walk to bathroom OT Goal Formulation: With patient Time For Goal Achievement: 05/16/21 Potential to Achieve Goals: Good  Plan Discharge plan remains appropriate    Co-evaluation                 AM-PAC OT "6 Clicks" Daily Activity     Outcome Measure   Help from another person eating meals?: None Help from another person taking care of personal grooming?: A Little Help from another person toileting, which includes using toliet, bedpan, or urinal?: A Lot Help from another person bathing (including washing, rinsing, drying)?: A Lot Help from another person to put on and taking off regular upper body clothing?: A Little Help from another person to put on and taking off regular lower body clothing?: Total 6 Click Score: 15    End of Session Equipment Utilized During Treatment: Rolling walker (2 wheels);Other (comment) (EVA)  OT Visit Diagnosis: Other abnormalities of gait and mobility (R26.89);Muscle weakness  (generalized) (M62.81)   Activity Tolerance Patient tolerated treatment well   Patient Left in chair;with call bell/phone within reach   Nurse Communication Mobility status        Time: 7741-2878 OT Time Calculation (min): 28 min  Charges: OT General Charges $OT Visit: 1 Visit OT Treatments $Therapeutic Activity: 23-37 mins  Fin Hupp, OTR/L [place]  Office [phone] Pager: [place] 05/01/2021, 2:35 PM

## 2021-05-01 NOTE — Progress Notes (Signed)
[facility] PROGRESS NOTE    Progress Note  Amber Herring  BJS:[id number] DOB: 1960-09-24 DOA: 04/16/2021 PCP: Denita Lung, MD     Brief Narrative:   Amber Herring is an 61 y.o. female past medical history of depression, GERD hyperlipidemia comes in complaining of lower extremity swelling and abdominal swelling.  Patient was admitted and treated for acute systolic heart failure Hospital course was complicated by atrial fibrillation requiring amiodarone cardiology consult and a GYN consult for vaginal bleeding for which she was started on Megace and told to follow-up with GYN as an outpatient.  Her hemoglobin has remained stable she was started on Eliquis underwent successful TEE cardioversion on 04/26/2021 now remains in sinus bradycardia   Assessment/Plan:   Acute atrial flutter/atrial ectopy: This was probably contributing to her heart failure exacerbation started on Eliquis underwent TEE cardioversion on 04/26/2021. Now on metoprolol, amiodarone and Eliquis. Cardiology will sign off awaiting skilled nursing facility placement.  Anasarca due to acute systolic heart failure: Cardiology has been consulted. They have reduced his diuretic therapy due to hypotension. Is improved, continue amiodarone, Toprol and Bumex.  Hyperkalemia: Treated now resolved her potassium was 4.5, try to keep potassium greater than 4 magnesium greater than 2.  Hypomagnesemia: Repleted now resolved.  Vaginal bleeding: Pelvic ultrasound is without acute findings. GYN was consulted recommended Megace and follow-up with them as an outpatient.  Iron deficiency anemia: She received IV iron.  Has remained relatively stable.  Sacral decubitus ulcer stage II present on admission: RN Pressure Injury Documentation:    Morbid obesity Estimated body mass index is 47.85 kg/m as calculated from the following:   Height as of this encounter: _0  (1.753 m).   Weight as of this encounter: 147  kg.    DVT prophylaxis: Eliquis Family Communication:none Status is: Inpatient  Remains inpatient appropriate because: Awaiting skilled nursing facility placement.   Code Status:     Code Status Orders  (From admission, onward)           Start     Ordered   04/17/21 0000  Full code  Continuous        04/16/21 2359           Code Status History     Date Active Date Inactive Code Status Order ID Comments User Context   01/25/2021 1923 01/27/2021 1858 Full Code [id number]  Elwyn Reach, MD ED         IV Access:   Peripheral IV   Procedures and diagnostic studies:   DG Knee 1-2 Views Left  Result Date: 04/30/2021 CLINICAL DATA:  Chronic bilateral anterior knee pain EXAM: RIGHT KNEE - 1-2 VIEW; LEFT KNEE - 1-2 VIEW COMPARISON:  None. FINDINGS: Left knee: Severe joint space loss, spurring, subchondral sclerosis of the medial compartment. Lucency in the cortex of the medial femoral condyle indicative of prior subchondral injury. Moderate joint space loss and spurring of the patellofemoral compartment. Mild spurring of the lateral compartment. Right knee: Moderate joint space loss, spurring, subchondral cystic changes seen in the medial compartment. Mild spurring of the lateral compartment. Moderate spurring and joint space loss of the patellofemoral compartment. IMPRESSION: Bilateral knee osteoarthrosis, greatest involving the medial compartment of the left knee. Electronically Signed   By: Miachel Roux M.D.   On: 04/30/2021 13:35   DG Knee 1-2 Views Right  Result Date: 04/30/2021 CLINICAL DATA:  Chronic bilateral anterior knee pain EXAM: RIGHT KNEE - 1-2 VIEW; LEFT KNEE - 1-2  VIEW COMPARISON:  None. FINDINGS: Left knee: Severe joint space loss, spurring, subchondral sclerosis of the medial compartment. Lucency in the cortex of the medial femoral condyle indicative of prior subchondral injury. Moderate joint space loss and spurring of the patellofemoral compartment.  Mild spurring of the lateral compartment. Right knee: Moderate joint space loss, spurring, subchondral cystic changes seen in the medial compartment. Mild spurring of the lateral compartment. Moderate spurring and joint space loss of the patellofemoral compartment. IMPRESSION: Bilateral knee osteoarthrosis, greatest involving the medial compartment of the left knee. Electronically Signed   By: Miachel Roux M.D.   On: 04/30/2021 13:35     Medical Consultants:   None.   Subjective:    Amber Herring  No complaints, sleepy this am  Objective:    Vitals:   04/30/21 1408 04/30/21 2046 05/01/21 0500 05/01/21 0600  BP: (!) 101/56 118/70  (!) 105/53  Pulse: (!) 55 (!) 56  (!) 55  Resp: _0 Temp: 97.9 F (36.6 C) 98.6 F (37 C)  98.7 F (37.1 C)  TempSrc: Oral Oral  Oral  SpO2: 96% 97%  95%  Weight:   (!) 147 kg   Height:       SpO2: 95 % O2 Flow Rate (L/min): 2 L/min   Intake/Output Summary (Last 24 hours) at 05/01/2021 0958 Last data filed at 05/01/2021 0815 Gross per 24 hour  Intake 700 ml  Output 1900 ml  Net -1200 ml    Filed Weights   04/28/21 0432 04/30/21 0524 05/01/21 0500  Weight: (!) 146.5 kg (!) 147 kg (!) 147 kg    Exam: General exam: In no acute distress. Respiratory system: Good air movement and clear to auscultation. Cardiovascular system: S1 & S2 heard, RRR. No JVD. Gastrointestinal system: Abdomen is nondistended, soft and nontender.  Extremities: No pedal edema. Skin: No rashes, lesions or ulcers Psychiatry: Judgement and insight appear normal. Mood & affect appropriate.   Data Reviewed:    Labs: Basic Metabolic Panel: Recent Labs  Lab 04/26/21 0557 04/27/21 1229 04/27/21 1427 04/28/21 0441 04/29/21 0831 04/30/21 0828  NA 136 135  --  133* 133* 132*  K 4.2 5.2*   < > 4.5 4.2 4.0  CL 101 97*  --  99 99 98  CO2 27 25  --  _1 GLUCOSE 93 121*  --  128* 140* 95  BUN 23* 34*  --  32* 27* 26*  CREATININE 0.91 1.17*  --   1.08* 0.93 0.81  CALCIUM 9.0 9.3  --  9.0 9.2 9.0  MG  --   --   --  2.1 2.0 2.0  PHOS  --   --   --  4.8*  --   --    < > = values in this interval not displayed.    GFR Estimated Creatinine Clearance: 117.7 mL/min (by C-G formula based on SCr of 0.81 mg/dL). Liver Function Tests: No results for input(s): AST, ALT, ALKPHOS, BILITOT, PROT, ALBUMIN in the last 168 hours. No results for input(s): LIPASE, AMYLASE in the last 168 hours. No results for input(s): AMMONIA in the last 168 hours. Coagulation profile Recent Labs  Lab 04/26/21 0557  INR 1.4*    COVID-19 Labs  No results for input(s): DDIMER, FERRITIN, LDH, CRP in the last 72 hours.  Lab Results  Component Value Date   [place] NEGATIVE 04/16/2021   [place] NEGATIVE 01/25/2021    CBC: Recent Labs  Lab 04/27/21  1229 04/28/21 0441  WBC 6.9 7.1  HGB 12.6 11.9*  HCT 43.4 40.6  MCV 79.5* 80.1  PLT 444* 364    Cardiac Enzymes: No results for input(s): CKTOTAL, CKMB, CKMBINDEX, TROPONINI in the last 168 hours. BNP (last 3 results) No results for input(s): PROBNP in the last 8760 hours. CBG: No results for input(s): GLUCAP in the last 168 hours. D-Dimer: No results for input(s): DDIMER in the last 72 hours. Hgb A1c: No results for input(s): HGBA1C in the last 72 hours. Lipid Profile: No results for input(s): CHOL, HDL, LDLCALC, TRIG, CHOLHDL, LDLDIRECT in the last 72 hours. Thyroid function studies: No results for input(s): TSH, T4TOTAL, T3FREE, THYROIDAB in the last 72 hours.  Invalid input(s): FREET3 Anemia work up: No results for input(s): VITAMINB12, FOLATE, FERRITIN, TIBC, IRON, RETICCTPCT in the last 72 hours. Sepsis Labs: Recent Labs  Lab 04/27/21 1229 04/28/21 0441  WBC 6.9 7.1    Microbiology No results found for this or any previous visit (from the past 240 hour(s)).   Medications:    (feeding supplement) PROSource Plus  30 mL Oral BID BM   amiodarone  200 mg Oral Daily    apixaban  5 mg Oral BID   bumetanide  1 mg Oral BID   ferrous sulfate  325 mg Oral TID WC   megestrol  40 mg Oral BID   metoprolol succinate  25 mg Oral Daily   pantoprazole  40 mg Oral Daily   Ensure Max Protein  11 oz Oral Daily   sodium chloride flush  3 mL Intravenous Q12H   Continuous Infusions:  sodium chloride        LOS: 14 days   Charlynne Cousins  [facility]  05/01/2021, 9:58 AM

## 2021-05-02 NOTE — Progress Notes (Signed)
[facility] PROGRESS NOTE    Progress Note  Amber Herring  WJX:[id number] DOB: 07-21-1960 DOA: 04/16/2021 PCP: Denita Lung, MD     Brief Narrative:   Amber Herring is an 61 y.o. female past medical history of depression, GERD hyperlipidemia comes in complaining of lower extremity swelling and abdominal swelling.  Patient was admitted and treated for acute systolic heart failure Hospital course was complicated by atrial fibrillation requiring amiodarone cardiology consult and a GYN consult for vaginal bleeding for which she was started on Megace and told to follow-up with GYN as an outpatient.  Her hemoglobin has remained stable she was started on Eliquis underwent successful TEE cardioversion on 04/26/2021 now remains in sinus bradycardia   Assessment/Plan:   Acute atrial flutter/atrial ectopy: This was probably contributing to her heart failure exacerbation started on Eliquis underwent TEE cardioversion on 04/26/2021. Now on metoprolol, amiodarone and Eliquis. Cardiology will sign off. Awaiting skilled nursing facility placement.  Anasarca due to acute systolic heart failure: Cardiology has been consulted. They have reduced his diuretic therapy due to hypotension. Is improved, continue amiodarone, Toprol and Bumex.  Hyperkalemia: Treated now resolved her potassium was 4.5, try to keep potassium greater than 4 magnesium greater than 2.  Hypomagnesemia: Repleted now resolved.  Vaginal bleeding: Pelvic ultrasound is without acute findings. GYN was consulted recommended Megace and follow-up with them as an outpatient.  Iron deficiency anemia: She received IV iron.  Has remained relatively stable.  Sacral decubitus ulcer stage II present on admission: RN Pressure Injury Documentation:    Morbid obesity Estimated body mass index is 44.6 kg/m as calculated from the following:   Height as of this encounter: _0  (1.753 m).   Weight as of this encounter: 137  kg.    DVT prophylaxis: Eliquis Family Communication:none Status is: Inpatient  Remains inpatient appropriate because: Awaiting skilled nursing facility placement.   Code Status:     Code Status Orders  (From admission, onward)           Start     Ordered   04/17/21 0000  Full code  Continuous        04/16/21 2359           Code Status History     Date Active Date Inactive Code Status Order ID Comments User Context   01/25/2021 1923 01/27/2021 1858 Full Code [id number]  Elwyn Reach, MD ED         IV Access:   Peripheral IV   Procedures and diagnostic studies:   DG Knee 1-2 Views Left  Result Date: 04/30/2021 CLINICAL DATA:  Chronic bilateral anterior knee pain EXAM: RIGHT KNEE - 1-2 VIEW; LEFT KNEE - 1-2 VIEW COMPARISON:  None. FINDINGS: Left knee: Severe joint space loss, spurring, subchondral sclerosis of the medial compartment. Lucency in the cortex of the medial femoral condyle indicative of prior subchondral injury. Moderate joint space loss and spurring of the patellofemoral compartment. Mild spurring of the lateral compartment. Right knee: Moderate joint space loss, spurring, subchondral cystic changes seen in the medial compartment. Mild spurring of the lateral compartment. Moderate spurring and joint space loss of the patellofemoral compartment. IMPRESSION: Bilateral knee osteoarthrosis, greatest involving the medial compartment of the left knee. Electronically Signed   By: Miachel Roux M.D.   On: 04/30/2021 13:35   DG Knee 1-2 Views Right  Result Date: 04/30/2021 CLINICAL DATA:  Chronic bilateral anterior knee pain EXAM: RIGHT KNEE - 1-2 VIEW; LEFT KNEE - 1-2  VIEW COMPARISON:  None. FINDINGS: Left knee: Severe joint space loss, spurring, subchondral sclerosis of the medial compartment. Lucency in the cortex of the medial femoral condyle indicative of prior subchondral injury. Moderate joint space loss and spurring of the patellofemoral compartment.  Mild spurring of the lateral compartment. Right knee: Moderate joint space loss, spurring, subchondral cystic changes seen in the medial compartment. Mild spurring of the lateral compartment. Moderate spurring and joint space loss of the patellofemoral compartment. IMPRESSION: Bilateral knee osteoarthrosis, greatest involving the medial compartment of the left knee. Electronically Signed   By: Miachel Roux M.D.   On: 04/30/2021 13:35     Medical Consultants:   None.   Subjective:    Amber Herring had a good night sleep Objective:    Vitals:   05/01/21 1320 05/01/21 1947 05/02/21 0500 05/02/21 0557  BP: (!) 115/57 127/69  130/74  Pulse: (!) 57 (!) 57  (!) 49  Resp: _0 Temp: (!) 97.5 F (36.4 C) (!) 97.4 F (36.3 C)  (!) 97.5 F (36.4 C)  TempSrc: Oral Oral  Oral  SpO2: 97% 99%  100%  Weight:   (!) 137 kg   Height:       SpO2: 100 % O2 Flow Rate (L/min): 2 L/min   Intake/Output Summary (Last 24 hours) at 05/02/2021 0912 Last data filed at 05/02/2021 0500 Gross per 24 hour  Intake 1200 ml  Output 2900 ml  Net -1700 ml    Filed Weights   04/30/21 0524 05/01/21 0500 05/02/21 0500  Weight: (!) 147 kg (!) 147 kg (!) 137 kg    Exam: General exam: In no acute distress. Respiratory system: Good air movement and clear to auscultation. Cardiovascular system: S1 & S2 heard, RRR. No JVD. Gastrointestinal system: Abdomen is nondistended, soft and nontender.  Extremities: No pedal edema. Skin: No rashes, lesions or ulcers Psychiatry: Judgement and insight appear normal. Mood & affect appropriate.   Data Reviewed:    Labs: Basic Metabolic Panel: Recent Labs  Lab 04/26/21 0557 04/27/21 1229 04/27/21 1427 04/28/21 0441 04/29/21 0831 04/30/21 0828  NA 136 135  --  133* 133* 132*  K 4.2 5.2*   < > 4.5 4.2 4.0  CL 101 97*  --  99 99 98  CO2 27 25  --  _1 GLUCOSE 93 121*  --  128* 140* 95  BUN 23* 34*  --  32* 27* 26*  CREATININE 0.91 1.17*  --   1.08* 0.93 0.81  CALCIUM 9.0 9.3  --  9.0 9.2 9.0  MG  --   --   --  2.1 2.0 2.0  PHOS  --   --   --  4.8*  --   --    < > = values in this interval not displayed.    GFR Estimated Creatinine Clearance: 112.9 mL/min (by C-G formula based on SCr of 0.81 mg/dL). Liver Function Tests: No results for input(s): AST, ALT, ALKPHOS, BILITOT, PROT, ALBUMIN in the last 168 hours. No results for input(s): LIPASE, AMYLASE in the last 168 hours. No results for input(s): AMMONIA in the last 168 hours. Coagulation profile Recent Labs  Lab 04/26/21 0557  INR 1.4*    COVID-19 Labs  No results for input(s): DDIMER, FERRITIN, LDH, CRP in the last 72 hours.  Lab Results  Component Value Date   [place] NEGATIVE 04/16/2021   [place] NEGATIVE 01/25/2021    CBC: Recent Labs  Lab 04/27/21  1229 04/28/21 0441  WBC 6.9 7.1  HGB 12.6 11.9*  HCT 43.4 40.6  MCV 79.5* 80.1  PLT 444* 364    Cardiac Enzymes: No results for input(s): CKTOTAL, CKMB, CKMBINDEX, TROPONINI in the last 168 hours. BNP (last 3 results) No results for input(s): PROBNP in the last 8760 hours. CBG: No results for input(s): GLUCAP in the last 168 hours. D-Dimer: No results for input(s): DDIMER in the last 72 hours. Hgb A1c: No results for input(s): HGBA1C in the last 72 hours. Lipid Profile: No results for input(s): CHOL, HDL, LDLCALC, TRIG, CHOLHDL, LDLDIRECT in the last 72 hours. Thyroid function studies: No results for input(s): TSH, T4TOTAL, T3FREE, THYROIDAB in the last 72 hours.  Invalid input(s): FREET3 Anemia work up: No results for input(s): VITAMINB12, FOLATE, FERRITIN, TIBC, IRON, RETICCTPCT in the last 72 hours. Sepsis Labs: Recent Labs  Lab 04/27/21 1229 04/28/21 0441  WBC 6.9 7.1    Microbiology No results found for this or any previous visit (from the past 240 hour(s)).   Medications:    (feeding supplement) PROSource Plus  30 mL Oral BID BM   amiodarone  200 mg Oral Daily    apixaban  5 mg Oral BID   bumetanide  1 mg Oral BID   capsaicin   Topical BID   ferrous sulfate  325 mg Oral TID WC   megestrol  40 mg Oral BID   metoprolol succinate  25 mg Oral Daily   pantoprazole  40 mg Oral Daily   Ensure Max Protein  11 oz Oral Daily   sodium chloride flush  3 mL Intravenous Q12H   Continuous Infusions:  sodium chloride        LOS: 15 days   Amber Herring  [facility]  05/02/2021, 9:13 AM

## 2021-05-03 LAB — BASIC METABOLIC PANEL
Anion gap: 8 (ref 5–15)
BUN: 23 mg/dL — ABNORMAL HIGH (ref 6–20)
CO2: 27 mmol/L (ref 22–32)
Calcium: 9 mg/dL (ref 8.9–10.3)
Chloride: 99 mmol/L (ref 98–111)
Creatinine, Ser: 0.84 mg/dL (ref 0.44–1.00)
GFR, Estimated: >60 mL/min (ref >60)
Glucose, Bld: 106 mg/dL — ABNORMAL HIGH (ref 70–99)
Potassium: 3.8 mmol/L (ref 3.5–5.1)
Sodium: 134 mmol/L — ABNORMAL LOW (ref 135–145)

## 2021-05-03 LAB — GLUCOSE, CAPILLARY: Glucose-Capillary: 114 mg/dL — ABNORMAL HIGH (ref 70–99)

## 2021-05-03 MED ORDER — METOPROLOL SUCCINATE ER 25 MG PO TB24
12.5000 mg | ORAL_TABLET | Freq: Every day | ORAL | Status: DC
  Administered 2021-05-04 – 2021-05-06 (×3): 12.5 mg via ORAL
  Filled 2021-05-03 (×3): qty 1

## 2021-05-03 MED ORDER — SALINE SPRAY 0.65 % NA SOLN
1.0000 | NASAL | Status: DC | PRN
  Administered 2021-05-03: 1 via NASAL
  Filled 2021-05-03: qty 44

## 2021-05-03 MED ORDER — LORATADINE 10 MG PO TABS
10.0000 mg | ORAL_TABLET | Freq: Every day | ORAL | Status: DC | PRN
  Administered 2021-05-03: 10 mg via ORAL
  Filled 2021-05-03: qty 1

## 2021-05-03 MED ORDER — POLYETHYLENE GLYCOL 3350 17 G PO PACK
17.0000 g | PACK | Freq: Two times a day (BID) | ORAL | Status: AC
  Administered 2021-05-03 – 2021-05-04 (×3): 17 g via ORAL
  Filled 2021-05-03 (×4): qty 1

## 2021-05-03 NOTE — Progress Notes (Signed)
[facility] PROGRESS NOTE    Progress Note  Amber Herring  OZD:[id number] DOB: Jul 23, 1960 DOA: 04/16/2021 PCP: Denita Lung, MD     Brief Narrative:   Amber Herring is an 61 y.o. female past medical history of depression, GERD hyperlipidemia comes in complaining of lower extremity swelling and abdominal swelling.  Patient was admitted and treated for acute systolic heart failure Hospital course was complicated by atrial fibrillation requiring amiodarone cardiology consult and a GYN consult for vaginal bleeding for which she was started on Megace and told to follow-up with GYN as an outpatient.  Her hemoglobin has remained stable she was started on Eliquis underwent successful TEE cardioversion on 04/26/2021 now remains in sinus bradycardia   Assessment/Plan:   Acute atrial flutter/atrial ectopy: This was probably contributing to her heart failure exacerbation started on Eliquis underwent TEE cardioversion on 04/26/2021. Now on metoprolol, amiodarone and Eliquis. Cardiology will sign off awaiting skilled nursing facility placement.  New onset of dizziness and tachycardia: Check a twelve-lead EKG place her back on a cardiac monitor. Check a basic metabolic panel try to keep potassium greater than 4 magnesium greater than 2. She denies any chest pain or shortness of breath just  lightheadedness.  Anasarca due to acute systolic heart failure: Cardiology has been consulted. They have reduced his diuretic therapy due to hypotension. Is improved, continue amiodarone, Toprol and Bumex.  Hyperkalemia: Treated now resolved her potassium was 4.5, try to keep potassium greater than 4 magnesium greater than 2.  Hypomagnesemia: Repleted now resolved.  Vaginal bleeding: Pelvic ultrasound is without acute findings. GYN was consulted recommended Megace and follow-up with them as an outpatient.  Iron deficiency anemia: She received IV iron.  Has remained relatively  stable.  Sacral decubitus ulcer stage II present on admission: RN Pressure Injury Documentation:    Morbid obesity Estimated body mass index is 41.64 kg/m as calculated from the following:   Height as of this encounter: _0  (1.753 m).   Weight as of this encounter: 127.9 kg.    DVT prophylaxis: Eliquis Family Communication:none Status is: Inpatient  Remains inpatient appropriate because: Awaiting skilled nursing facility placement.   Code Status:     Code Status Orders  (From admission, onward)           Start     Ordered   04/17/21 0000  Full code  Continuous        04/16/21 2359           Code Status History     Date Active Date Inactive Code Status Order ID Comments User Context   01/25/2021 1923 01/27/2021 1858 Full Code [id number]  Elwyn Reach, MD ED         IV Access:   Peripheral IV   Procedures and diagnostic studies:   No results found.   Medical Consultants:   None.   Subjective:    Amber Herring relates that about 20 to 30 minutes from me going into the room she was experiencing lightheadedness for about 10 seconds meet her feel awful and nauseated.  She denies any chest pain or shortness of breath.  Objective:    Vitals:   05/02/21 1956 05/03/21 0500 05/03/21 0535 05/03/21 0857  BP: (!) 118/50  107/65 (!) 119/56  Pulse: (!) 53  (!) 57 (!) 56  Resp: 20  20 (!) 22  Temp: 97.9 F (36.6 C)  (!) 97.2 F (36.2 C) 98.2 F (36.8 C)  TempSrc: Oral  Oral Oral  SpO2: 98%  100% 98%  Weight:  127.9 kg    Height:       SpO2: 98 % O2 Flow Rate (L/min): 2 L/min   Intake/Output Summary (Last 24 hours) at 05/03/2021 0916 Last data filed at 05/03/2021 0033 Gross per 24 hour  Intake --  Output 1100 ml  Net -1100 ml    Filed Weights   05/01/21 0500 05/02/21 0500 05/03/21 0500  Weight: (!) 147 kg (!) 137 kg 127.9 kg    Exam: General exam: In no acute distress. Respiratory system: Good air movement and clear to  auscultation. Cardiovascular system: S1 & S2 heard, RRR. No JVD. Gastrointestinal system: Abdomen is nondistended, soft and nontender.  Extremities: No pedal edema. Skin: No rashes, lesions or ulcers Psychiatry: Judgement and insight appear normal. Mood & affect appropriate.   Data Reviewed:    Labs: Basic Metabolic Panel: Recent Labs  Lab 04/27/21 1229 04/27/21 1427 04/28/21 0441 04/29/21 0831 04/30/21 0828  NA 135  --  133* 133* 132*  K 5.2*   < > 4.5 4.2 4.0  CL 97*  --  99 99 98  CO2 25  --  _0 GLUCOSE 121*  --  128* 140* 95  BUN 34*  --  32* 27* 26*  CREATININE 1.17*  --  1.08* 0.93 0.81  CALCIUM 9.3  --  9.0 9.2 9.0  MG  --   --  2.1 2.0 2.0  PHOS  --   --  4.8*  --   --    < > = values in this interval not displayed.    GFR Estimated Creatinine Clearance: 108.6 mL/min (by C-G formula based on SCr of 0.81 mg/dL). Liver Function Tests: No results for input(s): AST, ALT, ALKPHOS, BILITOT, PROT, ALBUMIN in the last 168 hours. No results for input(s): LIPASE, AMYLASE in the last 168 hours. No results for input(s): AMMONIA in the last 168 hours. Coagulation profile No results for input(s): INR, PROTIME in the last 168 hours.  COVID-19 Labs  No results for input(s): DDIMER, FERRITIN, LDH, CRP in the last 72 hours.  Lab Results  Component Value Date   [place] NEGATIVE 04/16/2021   [place] NEGATIVE 01/25/2021    CBC: Recent Labs  Lab 04/27/21 1229 04/28/21 0441  WBC 6.9 7.1  HGB 12.6 11.9*  HCT 43.4 40.6  MCV 79.5* 80.1  PLT 444* 364    Cardiac Enzymes: No results for input(s): CKTOTAL, CKMB, CKMBINDEX, TROPONINI in the last 168 hours. BNP (last 3 results) No results for input(s): PROBNP in the last 8760 hours. CBG: Recent Labs  Lab 05/03/21 0902  GLUCAP 114*   D-Dimer: No results for input(s): DDIMER in the last 72 hours. Hgb A1c: No results for input(s): HGBA1C in the last 72 hours. Lipid Profile: No results for input(s):  CHOL, HDL, LDLCALC, TRIG, CHOLHDL, LDLDIRECT in the last 72 hours. Thyroid function studies: No results for input(s): TSH, T4TOTAL, T3FREE, THYROIDAB in the last 72 hours.  Invalid input(s): FREET3 Anemia work up: No results for input(s): VITAMINB12, FOLATE, FERRITIN, TIBC, IRON, RETICCTPCT in the last 72 hours. Sepsis Labs: Recent Labs  Lab 04/27/21 1229 04/28/21 0441  WBC 6.9 7.1    Microbiology No results found for this or any previous visit (from the past 240 hour(s)).   Medications:    (feeding supplement) PROSource Plus  30 mL Oral BID BM   amiodarone  200 mg Oral Daily   apixaban  5 mg  Oral BID   bumetanide  1 mg Oral BID   capsaicin   Topical BID   ferrous sulfate  325 mg Oral TID WC   megestrol  40 mg Oral BID   metoprolol succinate  25 mg Oral Daily   pantoprazole  40 mg Oral Daily   Ensure Max Protein  11 oz Oral Daily   sodium chloride flush  3 mL Intravenous Q12H   Continuous Infusions:  sodium chloride        LOS: 16 days   Charlynne Cousins  [facility]  05/03/2021, 9:16 AM

## 2021-05-03 NOTE — Progress Notes (Signed)
Nutrition Follow-up  DOCUMENTATION CODES:   Morbid obesity  INTERVENTION:   -Ensure MAX Protein po daily, each supplement provides 150 kcal and 30 grams of protein    -Prosource Plus PO BID, each provides 100 kcals and 15g protein   NUTRITION DIAGNOSIS:   Increased nutrient needs related to wound healing as evidenced by estimated needs.  Ongoing.  GOAL:   Patient will meet greater than or equal to 90% of their needs  Progressing.  MONITOR:   PO intake, Supplement acceptance, Labs, Weight trends, I & O's, Skin  ASSESSMENT:   60yo with a history of morbid obesity, depression, chronic anemia, GERD, and HLD who presented to the ED with severe generalized swelling primarily of the legs and abdomen.  ROS was also positive for intermittent dysfunctional vaginal bleeding.  1/9 TEE  Patient consumed 100% of breakfast this morning (oatmeal, [place] sausage, pineapple).  Pt accepting all protein shakes and supplements.  Admission weight: 350 lbs. Current weight: 282 lbs.  I/Os:  -36L since 1/2 UOP: 1.7L x 24 hrs  Medications: Ferrous sulfate, Megace, Miralax  Labs reviewed: CBGs: 114 Low Na  Diet Order:   Diet Order             Diet Heart Room service appropriate? Yes; Fluid consistency: Thin; Fluid restriction: 1800 mL Fluid  Diet effective now                   EDUCATION NEEDS:   Education needs have been addressed  Skin:  Skin Assessment: Skin Integrity Issues: Skin Integrity Issues:: Stage II Stage II: left thigh  Last BM:  1/16  Height:   Ht Readings from Last 1 Encounters:  04/16/21 5' 9" (1.753 m)    Weight:   Wt Readings from Last 1 Encounters:  05/03/21 127.9 kg    BMI:  Body mass index is 41.64 kg/m.  Estimated Nutritional Needs:   Kcal:  2250-2450  Protein:  120-130g  Fluid:  1.8L -per fluid restriction  Amber Bibles, MS, RD, LDN Inpatient Clinical Dietitian Contact information available via Amion

## 2021-05-03 NOTE — TOC Progression Note (Signed)
Transition of Care [facility]) - Progression Note   Patient Details  Name: Amber Herring MRN: [id number] Date of Birth: 06/16/1960  Transition of Care [facility]) CM/SW [place], LCSW Phone Number: 05/03/2021, 2:30 PM  Clinical Narrative: CSW reviewed case with [facility] supervisor and an LOG was discussed with TOC leadership. Patient will not be approved for an LOG for rehab, so another discharge option will need to be pursued at this time. CSW spoke with patient and explained that rehab was not approved, so the patient will need another discharge plan. Per patient, she was "living with my daughter until I found me a place." Patient reported her daughter has "20 to 25 stairs" to get up to the apartment. CSW recommended that patient discuss a discharge plan with her daughter now that it has been confirmed SNF will not be approved. Hospitalist updated. TOC to follow.  Expected Discharge Plan: Home/Self Care Barriers to Discharge: Inadequate or no insurance  Expected Discharge Plan and Services Expected Discharge Plan: Home/Self Care In-house Referral: Clinical Social Work Living arrangements for the past 2 months: Apartment  Readmission Risk Interventions No flowsheet data found.

## 2021-05-04 NOTE — Progress Notes (Signed)
[facility] PROGRESS NOTE    Progress Note  Amber Herring  TLX:[id number] DOB: 07/05/1960 DOA: 04/16/2021 PCP: Denita Lung, MD     Brief Narrative:   Amber Herring is an 61 y.o. female past medical history of depression, GERD hyperlipidemia comes in complaining of lower extremity swelling and abdominal swelling.  Patient was admitted and treated for acute systolic heart failure Hospital course was complicated by atrial fibrillation requiring amiodarone cardiology consult and a GYN consult for vaginal bleeding for which she was started on Megace and told to follow-up with GYN as an outpatient.  Her hemoglobin has remained stable she was started on Eliquis underwent successful TEE cardioversion on 04/26/2021 now remains in sinus bradycardia   Assessment/Plan:   Acute atrial flutter/atrial ectopy: This was probably contributing to her heart failure exacerbation started on Eliquis underwent TEE cardioversion on 04/26/2021. Now on metoprolol, amiodarone and Eliquis. Cardiology will sign off awaiting skilled nursing facility placement. Physical therapy evaluated the patient, she cannot go to skilled nursing, she will need to go home with physical therapy and home assistance.  New onset of dizziness and tachycardia: She denies any chest pain or shortness of breath twelve-lead EKG showed right axis deviation sinus rhythm. She has not no events on telemetry. Now resolved she has not had any more events.  Anasarca due to acute systolic heart failure: Cardiology has been consulted. They have reduced his diuretic therapy due to hypotension. Is improved, continue amiodarone, Toprol and Bumex.  Hyperkalemia: Treated now resolved her potassium was 4.5, try to keep potassium greater than 4 magnesium greater than 2.  Hypomagnesemia: Repleted now resolved.  Vaginal bleeding: Pelvic ultrasound is without acute findings. GYN was consulted recommended Megace and follow-up with them as  an outpatient.  Iron deficiency anemia: She received IV iron.  Has remained relatively stable.  Sacral decubitus ulcer stage II present on admission: RN Pressure Injury Documentation:    Morbid obesity Estimated body mass index is 43.56 kg/m as calculated from the following:   Height as of this encounter: _0  (1.753 m).   Weight as of this encounter: 133.8 kg.    DVT prophylaxis: Eliquis Family Communication:none Status is: Inpatient  Remains inpatient appropriate because: Awaiting skilled nursing facility placement.   Code Status:     Code Status Orders  (From admission, onward)           Start     Ordered   04/17/21 0000  Full code  Continuous        04/16/21 2359           Code Status History     Date Active Date Inactive Code Status Order ID Comments User Context   01/25/2021 1923 01/27/2021 1858 Full Code [id number]  Elwyn Reach, MD ED         IV Access:   Peripheral IV   Procedures and diagnostic studies:   No results found.   Medical Consultants:   None.   Subjective:    Amber Herring no complaints  Objective:    Vitals:   05/03/21 1809 05/03/21 2033 05/04/21 0500 05/04/21 0536  BP: 137/81 (!) 124/55  124/69  Pulse: 79 97  61  Resp: (!) _1 Temp: 98.2 F (36.8 C) 97.8 F (36.6 C)  98.6 F (37 C)  TempSrc: Oral Oral  Oral  SpO2: 98% 100%  97%  Weight:   133.8 kg   Height:  SpO2: 97 % O2 Flow Rate (L/min): 2 L/min   Intake/Output Summary (Last 24 hours) at 05/04/2021 1015 Last data filed at 05/04/2021 0540 Gross per 24 hour  Intake 948 ml  Output 2500 ml  Net -1552 ml    Filed Weights   05/02/21 0500 05/03/21 0500 05/04/21 0500  Weight: (!) 137 kg 127.9 kg 133.8 kg    Exam: General exam: In no acute distress. Respiratory system: Good air movement and clear to auscultation. Cardiovascular system: S1 & S2 heard, RRR. No JVD. Gastrointestinal system: Abdomen is nondistended, soft and  nontender.  Extremities: No pedal edema. Skin: No rashes, lesions or ulcers Psychiatry: Judgement and insight appear normal. Mood & affect appropriate.   Data Reviewed:    Labs: Basic Metabolic Panel: Recent Labs  Lab 04/27/21 1229 04/27/21 1427 04/28/21 0441 04/29/21 0831 04/30/21 0828 05/03/21 0941  NA 135  --  133* 133* 132* 134*  K 5.2*   < > 4.5 4.2 4.0 3.8  CL 97*  --  99 99 98 99  CO2 25  --  _0 GLUCOSE 121*  --  128* 140* 95 106*  BUN 34*  --  32* 27* 26* 23*  CREATININE 1.17*  --  1.08* 0.93 0.81 0.84  CALCIUM 9.3  --  9.0 9.2 9.0 9.0  MG  --   --  2.1 2.0 2.0  --   PHOS  --   --  4.8*  --   --   --    < > = values in this interval not displayed.    GFR Estimated Creatinine Clearance: 107.4 mL/min (by C-G formula based on SCr of 0.84 mg/dL). Liver Function Tests: No results for input(s): AST, ALT, ALKPHOS, BILITOT, PROT, ALBUMIN in the last 168 hours. No results for input(s): LIPASE, AMYLASE in the last 168 hours. No results for input(s): AMMONIA in the last 168 hours. Coagulation profile No results for input(s): INR, PROTIME in the last 168 hours.  COVID-19 Labs  No results for input(s): DDIMER, FERRITIN, LDH, CRP in the last 72 hours.  Lab Results  Component Value Date   SARSCOV2NAA NEGATIVE 04/16/2021   Echo NEGATIVE 01/25/2021    CBC: Recent Labs  Lab 04/27/21 1229 04/28/21 0441  WBC 6.9 7.1  HGB 12.6 11.9*  HCT 43.4 40.6  MCV 79.5* 80.1  PLT 444* 364    Cardiac Enzymes: No results for input(s): CKTOTAL, CKMB, CKMBINDEX, TROPONINI in the last 168 hours. BNP (last 3 results) No results for input(s): PROBNP in the last 8760 hours. CBG: Recent Labs  Lab 05/03/21 0902  GLUCAP 114*    D-Dimer: No results for input(s): DDIMER in the last 72 hours. Hgb A1c: No results for input(s): HGBA1C in the last 72 hours. Lipid Profile: No results for input(s): CHOL, HDL, LDLCALC, TRIG, CHOLHDL, LDLDIRECT in the last 72  hours. Thyroid function studies: No results for input(s): TSH, T4TOTAL, T3FREE, THYROIDAB in the last 72 hours.  Invalid input(s): FREET3 Anemia work up: No results for input(s): VITAMINB12, FOLATE, FERRITIN, TIBC, IRON, RETICCTPCT in the last 72 hours. Sepsis Labs: Recent Labs  Lab 04/27/21 1229 04/28/21 0441  WBC 6.9 7.1    Microbiology No results found for this or any previous visit (from the past 240 hour(s)).   Medications:    (feeding supplement) PROSource Plus  30 mL Oral BID BM   amiodarone  200 mg Oral Daily   apixaban  5 mg Oral BID   bumetanide  1 mg Oral BID   capsaicin   Topical BID   ferrous sulfate  325 mg Oral TID WC   megestrol  40 mg Oral BID   metoprolol succinate  12.5 mg Oral Daily   pantoprazole  40 mg Oral Daily   polyethylene glycol  17 g Oral BID   Ensure Max Protein  11 oz Oral Daily   sodium chloride flush  3 mL Intravenous Q12H   Continuous Infusions:  sodium chloride        LOS: 17 days   Charlynne Cousins  [facility]  05/04/2021, 10:15 AM

## 2021-05-04 NOTE — TOC Progression Note (Addendum)
Transition of Care [facility]) - Progression Note    Patient Details  Name: [name] MRN: [id number] Date of Birth: December 25, 1960  Transition of Care [facility]) CM/SW Contact  Ross Ludwig,  Phone Number: 05/04/2021, 5:19 PM  Clinical Narrative:    CSW spoke to patient, she stated she is planning to go to her daughter's house tomorrow, address is [address] [place], [place], [place], [address].  Financial navigator Delana Meyer has been working with patient for disability and Medicaid.  [place] and social work set up through [place], home health orders are in.  Dr. Doy Hutching [facility] physician advisor agreed to sign orders for a couple weeks as long as patient gets established with a PCP.  CSW attempted to make an appointment for patient at [facility], unable to get through at this time.  CSW will ask TOC case manager to follow up tomorrow with making an appointment for patient.  TOC continuing to follow patient's progress throughout discharge planning.   Expected Discharge Plan: Home/Self Care Barriers to Discharge: Inadequate or no insurance  Expected Discharge Plan and Services Expected Discharge Plan: Home/Self Care In-house Referral: Clinical Social Work     Living arrangements for the past 2 months: Apartment                                       Social Determinants of Health (SDOH) Interventions    Readmission Risk Interventions No flowsheet data found.

## 2021-05-05 DIAGNOSIS — M25562 Pain in left knee: Secondary | ICD-10-CM

## 2021-05-05 DIAGNOSIS — G8929 Other chronic pain: Secondary | ICD-10-CM

## 2021-05-05 DIAGNOSIS — K5900 Constipation, unspecified: Secondary | ICD-10-CM

## 2021-05-05 DIAGNOSIS — M25561 Pain in right knee: Secondary | ICD-10-CM

## 2021-05-05 LAB — CBC
HCT: 45.6 % (ref 36.0–46.0)
Hemoglobin: 13.7 g/dL (ref 12.0–15.0)
MCH: 24.1 pg — ABNORMAL LOW (ref 26.0–34.0)
MCHC: 30 g/dL (ref 30.0–36.0)
MCV: 80.3 fL (ref 80.0–100.0)
Platelets: 388 10*3/uL (ref 150–400)
RBC: 5.68 MIL/uL — ABNORMAL HIGH (ref 3.87–5.11)
RDW: 25.4 % — ABNORMAL HIGH (ref 11.5–15.5)
WBC: 7.1 10*3/uL (ref 4.0–10.5)
nRBC: 0 % (ref 0.0–0.2)

## 2021-05-05 LAB — BASIC METABOLIC PANEL
Anion gap: 8 (ref 5–15)
BUN: 21 mg/dL — ABNORMAL HIGH (ref 6–20)
CO2: 28 mmol/L (ref 22–32)
Calcium: 9.4 mg/dL (ref 8.9–10.3)
Chloride: 96 mmol/L — ABNORMAL LOW (ref 98–111)
Creatinine, Ser: 1.01 mg/dL — ABNORMAL HIGH (ref 0.44–1.00)
GFR, Estimated: >60 mL/min (ref >60)
Glucose, Bld: 95 mg/dL (ref 70–99)
Potassium: 5.6 mmol/L — ABNORMAL HIGH (ref 3.5–5.1)
Sodium: 132 mmol/L — ABNORMAL LOW (ref 135–145)

## 2021-05-05 LAB — POTASSIUM: Potassium: 4.3 mmol/L (ref 3.5–5.1)

## 2021-05-05 LAB — MAGNESIUM: Magnesium: 2 mg/dL (ref 1.7–2.4)

## 2021-05-05 MED ORDER — DICLOFENAC SODIUM 1 % EX GEL
2.0000 g | Freq: Four times a day (QID) | CUTANEOUS | Status: DC
  Administered 2021-05-05 – 2021-05-06 (×4): 2 g via TOPICAL
  Filled 2021-05-05: qty 100

## 2021-05-05 MED ORDER — SENNOSIDES-DOCUSATE SODIUM 8.6-50 MG PO TABS
1.0000 | ORAL_TABLET | Freq: Two times a day (BID) | ORAL | Status: DC
  Administered 2021-05-05 – 2021-05-06 (×4): 1 via ORAL
  Filled 2021-05-05 (×4): qty 1

## 2021-05-05 MED ORDER — SORBITOL 70 % SOLN
30.0000 mL | Status: AC
  Administered 2021-05-05 (×2): 30 mL via ORAL
  Filled 2021-05-05 (×2): qty 30

## 2021-05-05 MED ORDER — POLYETHYLENE GLYCOL 3350 17 G PO PACK
17.0000 g | PACK | Freq: Two times a day (BID) | ORAL | Status: DC
  Administered 2021-05-05 – 2021-05-06 (×4): 17 g via ORAL
  Filled 2021-05-05 (×4): qty 1

## 2021-05-05 NOTE — Plan of Care (Signed)

## 2021-05-05 NOTE — Progress Notes (Signed)
Occupational Therapy Treatment Patient Details Name: Amber Herring MRN: [id number] DOB: Mar 16, 1961 Today's Date: 05/05/2021   History of present illness Patient presents to the ER 04/16/21 with chief complaint of shortness of breath and swelling of her lower abdomen and bilateral lower extremities, anasarca. PMH: recent left hand fx-3 months ago), GERD, obesity, metabolic syndrome, anxiety.   OT comments  Treatment focused on functional mobility in order to advance ADLs to out of the bed and promote house hold distances for discharge. Patient supervision to transfer to side of bed with use of bed rails and was min guard to stand with RW. She wasn't able to take steps due to right knee pain and returned to seated position. At edge of bed performed hip flexion and knee extension reps, bilaterally, to prepare to stand again and ambulate. PT in room to co-treat. Patient able to ambulate short distance in room and transfer to [facility]. Cont POC.   Recommendations for follow up therapy are one component of a multi-disciplinary discharge planning process, led by the attending physician.  Recommendations may be updated based on patient status, additional functional criteria and insurance authorization.    Follow Up Recommendations  Skilled nursing-short term rehab (<3 hours/day)    Assistance Recommended at Discharge Frequent or constant Supervision/Assistance  Patient can return home with the following  A little help with walking and/or transfers;A little help with bathing/dressing/bathroom;Help with stairs or ramp for entrance;Assistance with cooking/housework   Equipment Recommendations   (bari BSC, bari walker)    Recommendations for Other Services      Precautions / Restrictions Precautions Precautions: Fall Precaution Comments: B knee Arthritis and L hand splint from Fx approx a month ago (fall) Required Braces or Orthoses: Splint/Cast Splint/Cast: has a splint for Left hand - for heavier  ADL and hand use. Restrictions Weight Bearing Restrictions: No LUE Weight Bearing: Weight bearing as tolerated Other Position/Activity Restrictions: per outpatient OT notes - use of splint with heavier tasks but otherwise is WBAT L UE.       Mobility Bed Mobility Overal bed mobility: Needs Assistance Bed Mobility: Supine to Sit     Supine to sit: Supervision, HOB elevated     General bed mobility comments: able to use bed rails and hand hold to pull up on to transfer to side of bed    Transfers Overall transfer level: Needs assistance Equipment used: Rolling walker (2 wheels) Transfers: Sit to/from Stand Sit to Stand: Min guard, From elevated surface           General transfer comment: min guard to stand with RW x 1. Required return to sitting due to right knee pain.     Balance Overall balance assessment: Needs assistance Sitting-balance support: No upper extremity supported, Feet supported Sitting balance-Leahy Scale: Normal     Standing balance support: Reliant on assistive device for balance   Standing balance comment: reliant on walker                           ADL either performed or assessed with clinical judgement   ADL Overall ADL's : Needs assistance/impaired                         Toilet Transfer: Min guard;BSC/3in1;Rolling walker (2 wheels) Toilet Transfer Details (indicate cue type and reason): +2 for safety but no physical assist Toileting- Clothing Manipulation and Hygiene: Sitting/lateral lean Toileting - Clothing Manipulation Details (indicate  cue type and reason): patient reports being able to perform in seated position     Functional mobility during ADLs: +2 for safety/equipment;Rolling walker (2 wheels)        Cognition Arousal/Alertness: Awake/alert Behavior During Therapy: WFL for tasks assessed/performed Overall Cognitive Status: Within Functional Limits for tasks assessed                                                 General Comments VSS    Pertinent Vitals/ Pain       Pain Assessment Pain Assessment: Faces Faces Pain Scale: Hurts little more Pain Location: B knees (Arthritis) (R > L) Pain Descriptors / Indicators: Grimacing, Aching Pain Intervention(s): Monitored during session   Frequency  Min 2X/week        Progress Toward Goals  OT Goals(current goals can now be found in the care plan section)  Progress towards OT goals: Progressing toward goals  Acute Rehab OT Goals Patient Stated Goal: to walk to bathroom OT Goal Formulation: With patient Time For Goal Achievement: 05/16/21 Potential to Achieve Goals: Good  Plan Discharge plan remains appropriate    Co-evaluation                 AM-PAC OT "6 Clicks" Daily Activity     Outcome Measure   Help from another person eating meals?: None Help from another person taking care of personal grooming?: A Little Help from another person toileting, which includes using toliet, bedpan, or urinal?: A Little Help from another person bathing (including washing, rinsing, drying)?: A Little Help from another person to put on and taking off regular upper body clothing?: A Little Help from another person to put on and taking off regular lower body clothing?: A Lot 6 Click Score: 18    End of Session Equipment Utilized During Treatment: Rolling walker (2 wheels)  OT Visit Diagnosis: Other abnormalities of gait and mobility (R26.89);Muscle weakness (generalized) (M62.81)   Activity Tolerance Patient tolerated treatment well   Patient Left  (left on [facility])   Nurse Communication Mobility status        Time: 1029-1105 OT Time Calculation (min): 36 min  Charges: OT General Charges $OT Visit: 1 Visit OT Treatments $Therapeutic Activity: 8-22 mins  Tawana Pasch, OTR/L [place]  Office [phone] Pager: [place] 05/05/2021, 12:33 PM

## 2021-05-05 NOTE — Progress Notes (Signed)
PROGRESS NOTE    Amber Herring  NAT:[id number] DOB: 03/02/1961 DOA: 04/16/2021 PCP: Denita Lung, MD    Chief Complaint  Patient presents with   Hematuria   Shortness of Breath    Brief Narrative:  Amber Herring is an 61 y.o. female past medical history of depression, GERD hyperlipidemia comes in complaining of lower extremity swelling and abdominal swelling.  Patient was admitted and treated for acute systolic heart failure Hospital course was complicated by atrial fibrillation requiring amiodarone cardiology consult and a GYN consult for vaginal bleeding for which she was started on Megace and told to follow-up with GYN as an outpatient.  Her hemoglobin has remained stable she was started on Eliquis underwent successful TEE cardioversion on 04/26/2021 now remains in sinus bradycardia   Assessment & Plan:   Principal Problem:   Anasarca Active Problems:   Lower urinary tract infectious disease   Morbid obesity (HCC)   Vaginal bleeding   Iron deficiency anemia due to chronic blood loss   Cardiomegaly   Chest pain   Atrial flutter (HCC)   CAP (community acquired pneumonia)   Pressure injury of skin   Acute on chronic combined systolic and diastolic CHF (congestive heart failure) ([place])  #1 atrial flutter/atrial ectopy -Felt likely contributed to CHF exacerbation, patient started on Eliquis status post TEE cardioversion 04/26/2021. -Continue amiodarone, metoprolol, Eliquis. -Patient was being followed by cardiology who have signed off. -Patient assessed by PT is noted patient cannot go to SNF and will need to go home with home health therapies. -Outpatient follow-up with cardiology.  2.  New onset dizziness/tachycardia -Patient denies any chest pain, no shortness of breath.  EKG with right axis deviation. -Patient in sinus rhythm. -Resolved.  3.  Anasarca secondary to acute systolic heart failure -Patient seen in consultation by cardiology. -Patient currently on  Bumex at a reduced dose due to hypotension. -Continue amiodarone, Toprol, Bumex.  4.  Hyperkalemia -Repeat potassium at 4.3.  5.  Hypomagnesemia -Repleted.  6.  Vaginal bleeding -Pelvic ultrasound with no acute findings. -Patient seen in consultation by GYN and patient started on Megace, outpatient follow-up.  7.  Iron deficiency anemia -Status post IV iron. -Follow H&H.  8.  Sacral decubitus ulcer stage II, POA   -Continue current dressing changes   #9.  Morbid obesity -Lifestyle modification.  10.  Constipation Placed on MiraLAX twice daily, Senokot-S twice daily. -Sorbitol p.o. x1.   DVT prophylaxis: Eliquis Code Status: Full Family Communication: Updated patient.  No family at bedside. Disposition:   Status is: Inpatient  Remains inpatient appropriate because: Severity of illness.  Unsafe disposition today as daughter is currently out of town and nobody to help receive patient.       Consultants:  OB/GYN: Dr. Damita Dunnings 04/21/2021 Cardiology: Dr. Debara Pickett 04/17/2021  Procedures:  CT abdomen and pelvis 04/16/2021 Chest x-ray 04/16/2021 Plain films of bilateral knees 04/30/2021 Pelvic ultrasound 04/17/2021 2D echo 04/17/2021 TEE 04/26/2021 Lower extremity Dopplers 04/17/2022 TEE cardioversion 04/26/2021   Antimicrobials:  Anti-infectives (From admission, onward)    Start     Dose/Rate Route Frequency Ordered Stop   04/17/21 2100  cefTRIAXone (ROCEPHIN) 1 g in sodium chloride 0.9 % 100 mL IVPB  Status:  Discontinued        1 g 200 mL/hr over 30 Minutes Intravenous Every 24 hours 04/16/21 2129 04/17/21 1821   04/17/21 0145  azithromycin (ZITHROMAX) 500 mg in sodium chloride 0.9 % 250 mL IVPB  Status:  Discontinued  500 mg 250 mL/hr over 60 Minutes Intravenous Every 24 hours 04/17/21 0132 04/17/21 1821   04/16/21 2045  cefTRIAXone (ROCEPHIN) 1 g in sodium chloride 0.9 % 100 mL IVPB        1 g 200 mL/hr over 30 Minutes Intravenous  Once 04/16/21 2042  04/16/21 2130         Subjective: Patient complain of constipation.  No chest pain.  No shortness of breath.  No abdominal pain.  States his daughter is on the way to [facility] to see her dad who was just hospitalized.  Physical therapy at bedside.  Patient complained of bilateral knee pain.  Objective: Vitals:   05/04/21 1928 05/05/21 0441 05/05/21 0500 05/05/21 1032  BP: 115/63 (!) 117/56  (!) 119/58  Pulse: (!) 59 98  63  Resp: 18 20    Temp: 98.2 F (36.8 C) 99 F (37.2 C)  98.2 F (36.8 C)  TempSrc: Oral Oral  Oral  SpO2: 100% 98%  100%  Weight:   123.4 kg   Height:        Intake/Output Summary (Last 24 hours) at 05/05/2021 1040 Last data filed at 05/05/2021 0754 Gross per 24 hour  Intake 1420 ml  Output 2800 ml  Net -1380 ml   Filed Weights   05/03/21 0500 05/04/21 0500 05/05/21 0500  Weight: 127.9 kg 133.8 kg 123.4 kg    Examination:  General exam: Appears calm and comfortable. Respiratory system: Clear to auscultation. Respiratory effort normal. Cardiovascular system: S1 & S2 heard, RRR. No JVD, murmurs, rubs, gallops or clicks. No pedal edema. Gastrointestinal system: Abdomen is nondistended, soft and nontender. No organomegaly or masses felt. Normal bowel sounds heard. Central nervous system: Alert and oriented. No focal neurological deficits. Extremities: Symmetric 5 x 5 power. Skin: No rashes, lesions or ulcers Psychiatry: Judgement and insight appear normal. Mood & affect appropriate.     Data Reviewed: I have personally reviewed following labs and imaging studies  CBC: Recent Labs  Lab 05/05/21 0448  WBC 7.1  HGB 13.7  HCT 45.6  MCV 80.3  PLT 664    Basic Metabolic Panel: Recent Labs  Lab 04/29/21 0831 04/30/21 0828 05/03/21 0941  NA 133* 132* 134*  K 4.2 4.0 3.8  CL 99 98 99  CO2 _0 GLUCOSE 140* 95 106*  BUN 27* 26* 23*  CREATININE 0.93 0.81 0.84  CALCIUM 9.2 9.0 9.0  MG 2.0 2.0  --     GFR: Estimated Creatinine  Clearance: 102.7 mL/min (by C-G formula based on SCr of 0.84 mg/dL).  Liver Function Tests: No results for input(s): AST, ALT, ALKPHOS, BILITOT, PROT, ALBUMIN in the last 168 hours.  CBG: Recent Labs  Lab 05/03/21 0902  GLUCAP 114*     No results found for this or any previous visit (from the past 240 hour(s)).       Radiology Studies: No results found.      Scheduled Meds:  (feeding supplement) PROSource Plus  30 mL Oral BID BM   amiodarone  200 mg Oral Daily   apixaban  5 mg Oral BID   bumetanide  1 mg Oral BID   capsaicin   Topical BID   ferrous sulfate  325 mg Oral TID WC   megestrol  40 mg Oral BID   metoprolol succinate  12.5 mg Oral Daily   pantoprazole  40 mg Oral Daily   Ensure Max Protein  11 oz Oral Daily   sodium chloride flush  3 mL Intravenous Q12H   Continuous Infusions:  sodium chloride       LOS: 18 days    Time spent: 35 minutes    Irine Seal, MD [facility]   To contact the attending provider between 7A-7P or the covering provider during after hours 7P-7A, please log into the web site [email or web address] and access using universal [place] password for that web site. If you do not have the password, please call the hospital operator.  05/05/2021, 10:40 AM

## 2021-05-05 NOTE — Progress Notes (Signed)
Physical Therapy Treatment Patient Details Name: Amber Herring MRN: [id number] DOB: May 01, 1960 Today's Date: 05/05/2021   History of Present Illness Patient presents to the ER 04/16/21 with chief complaint of shortness of breath and swelling of her lower abdomen and bilateral lower extremities, anasarca. PMH: recent left hand fx-3 months ago), GERD, obesity, metabolic syndrome, anxiety.    PT Comments    Pt making good progress today.  She ambulated 10' with chair follow and min guard.  Pt with slow gait and limited by knee pain.  Continue to progress as able.     Recommendations for follow up therapy are one component of a multi-disciplinary discharge planning process, led by the attending physician.  Recommendations may be updated based on patient status, additional functional criteria and insurance authorization.  Follow Up Recommendations  Skilled nursing-short term rehab (<3 hours/day)     Assistance Recommended at Discharge Frequent or constant Supervision/Assistance  Patient can return home with the following A lot of help with bathing/dressing/bathroom;A lot of help with walking and/or transfers;Assistance with cooking/housework;Assist for transportation;Help with stairs or ramp for entrance   Equipment Recommendations  None recommended by PT    Recommendations for Other Services       Precautions / Restrictions Precautions Precautions: Fall Precaution Comments: B knee Arthritis and L hand splint from Fx approx a month ago (fall) Splint/Cast: has a splint for Left hand - for heavier ADL and hand use. Restrictions Other Position/Activity Restrictions: per outpatient OT notes - use of splint with heavier tasks but otherwise is WBAT L UE.     Mobility  Bed Mobility               General bed mobility comments: sitting EOB atrival    Transfers Overall transfer level: Needs assistance Equipment used: Rolling walker (2 wheels) (bariatric RW) Transfers: Sit  to/from Stand Sit to Stand: Min guard, From elevated surface, +2 safety/equipment                Ambulation/Gait Ambulation/Gait assistance: Min guard, +2 safety/equipment Gait Distance (Feet): 10 Feet Assistive device: Rolling walker (2 wheels) Gait Pattern/deviations: Step-to pattern, Decreased stride length Gait velocity: decreased     General Gait Details: Chair follow; very slow steps with difficulty moving legs; tried to cue on step to R gait due to R knee pain worse but pt unable   Stairs             Wheelchair Mobility    Modified Rankin (Stroke Patients Only)       Balance Overall balance assessment: Needs assistance Sitting-balance support: Feet supported Sitting balance-Leahy Scale: Good     Standing balance support: Reliant on assistive device for balance, Bilateral upper extremity supported Standing balance-Leahy Scale: Poor                              Cognition Arousal/Alertness: Awake/alert Behavior During Therapy: WFL for tasks assessed/performed Overall Cognitive Status: Within Functional Limits for tasks assessed                                          Exercises      General Comments General comments (skin integrity, edema, etc.): VSS      Pertinent Vitals/Pain Pain Assessment Pain Assessment: Faces Faces Pain Scale: Hurts little more Pain Location: B knees (Arthritis) (R >  L) Pain Descriptors / Indicators: Grimacing, Aching Pain Intervention(s): Limited activity within patient's tolerance    Home Living                          Prior Function            PT Goals (current goals can now be found in the care plan section) Progress towards PT goals: Progressing toward goals    Frequency    Min 2X/week      PT Plan Current plan remains appropriate    Co-evaluation              AM-PAC PT "6 Clicks" Mobility   Outcome Measure  Help needed turning from your back to  your side while in a flat bed without using bedrails?: A Lot Help needed moving from lying on your back to sitting on the side of a flat bed without using bedrails?: A Lot Help needed moving to and from a bed to a chair (including a wheelchair)?: A Little Help needed standing up from a chair using your arms (e.g., wheelchair or bedside chair)?: A Little Help needed to walk in hospital room?: Total Help needed climbing 3-5 steps with a railing? : Total 6 Click Score: 12    End of Session Equipment Utilized During Treatment: Gait belt Activity Tolerance: Patient limited by pain Patient left: Other (comment);with call bell/phone within reach (wanted to sit on [facility] for extended time due to constipated) Nurse Communication: Mobility status PT Visit Diagnosis: Unsteadiness on feet (R26.81);Difficulty in walking, not elsewhere classified (R26.2);Pain     Time: 1051-1105 PT Time Calculation (min) (ACUTE ONLY): 14 min  Charges:  $Gait Training: 8-22 mins                     Abran Richard, PT Acute Rehab Services Pager [phone] [facility] [place] 05/05/2021, 12:06 PM

## 2021-05-05 NOTE — TOC Transition Note (Signed)
Transition of Care [facility]) - CM/SW Discharge Note  Patient Details  Name: GRETNA BERGIN MRN: [id number] Date of Birth: 01-10-1961  Transition of Care [facility] CM/SW Contact:  Sherie Don, LCSW Phone Number: 05/05/2021, 1:18 PM  Clinical Narrative: CSW set patient up with a hospital follow appointment at [facility] for Tuesday June 15, 2021 at 2:30pm with Dr. Wynetta Emery. Appointment added to AVS.  CSW spoke with patient about returning home with her daughter. Per patient, her daughter is going to see her father at a hospital in [place] after she gets off work today and patient is requesting to stay until tomorrow. CSW asked how patient will get into her daughter's apartment since she reported she would have no way to get into it today even after CSW requested that patient call daughter to arrange getting into the apartment. Patient stated, "I'll figure out a way." As a hospital follow up appointment and charity [facility] have been set up by TOC, there are no other TOC needs at this time so TOC is signing off.  Final next level of care: Home w Home Health Services Barriers to Discharge: Barriers Resolved  Patient Goals and CMS Choice CMS Medicare.gov Compare Post Acute Care list provided to:: Patient Choice offered to / list presented to : Patient  Discharge Plan and Services In-house Referral: Clinical Social Work         DME Arranged: N/A DME Agency: NA HH Arranged: PT [place] Agency: [place] Date [place] Agency Contacted: 05/04/21 Representative spoke with at [place]: Amy  Readmission Risk Interventions No flowsheet data found.

## 2021-05-06 DIAGNOSIS — N950 Postmenopausal bleeding: Secondary | ICD-10-CM

## 2021-05-06 LAB — MAGNESIUM: Magnesium: 1.9 mg/dL (ref 1.7–2.4)

## 2021-05-06 LAB — BASIC METABOLIC PANEL
Anion gap: 19 — ABNORMAL HIGH (ref 5–15)
BUN: 24 mg/dL — ABNORMAL HIGH (ref 6–20)
CO2: 24 mmol/L (ref 22–32)
Calcium: 9.9 mg/dL (ref 8.9–10.3)
Chloride: 94 mmol/L — ABNORMAL LOW (ref 98–111)
Creatinine, Ser: 0.82 mg/dL (ref 0.44–1.00)
GFR, Estimated: >60 mL/min (ref >60)
Glucose, Bld: 135 mg/dL — ABNORMAL HIGH (ref 70–99)
Potassium: 3.9 mmol/L (ref 3.5–5.1)
Sodium: 137 mmol/L (ref 135–145)

## 2021-05-06 MED ORDER — TRAMADOL HCL 50 MG PO TABS
50.0000 mg | ORAL_TABLET | Freq: Four times a day (QID) | ORAL | 0 refills | Status: AC | PRN
Start: 2021-05-06 — End: ?

## 2021-05-06 MED ORDER — CAPSAICIN 0.025 % EX CREA: TOPICAL_CREAM | Freq: Two times a day (BID) | CUTANEOUS | 0 refills | Status: DC

## 2021-05-06 MED ORDER — PANTOPRAZOLE SODIUM 40 MG PO TBEC: 40.0000 mg | DELAYED_RELEASE_TABLET | Freq: Every day | ORAL | 1 refills | Status: AC

## 2021-05-06 MED ORDER — SENNOSIDES-DOCUSATE SODIUM 8.6-50 MG PO TABS: 1.0000 | ORAL_TABLET | Freq: Two times a day (BID) | ORAL | 1 refills | Status: DC

## 2021-05-06 MED ORDER — METOPROLOL SUCCINATE ER 25 MG PO TB24: 12.5000 mg | ORAL_TABLET | Freq: Every day | ORAL | 1 refills | Status: DC

## 2021-05-06 MED ORDER — BUMETANIDE 1 MG PO TABS: 1.0000 mg | ORAL_TABLET | Freq: Two times a day (BID) | ORAL | 1 refills | Status: DC

## 2021-05-06 MED ORDER — AMIODARONE HCL 200 MG PO TABS: 200.0000 mg | ORAL_TABLET | Freq: Every day | ORAL | 1 refills | Status: DC

## 2021-05-06 MED ORDER — APIXABAN 5 MG PO TABS: 5.0000 mg | ORAL_TABLET | Freq: Two times a day (BID) | ORAL | 1 refills | Status: DC

## 2021-05-06 MED ORDER — FERROUS SULFATE 325 (65 FE) MG PO TABS: 325.0000 mg | ORAL_TABLET | Freq: Three times a day (TID) | ORAL | 3 refills | Status: AC

## 2021-05-06 MED ORDER — MEGESTROL ACETATE 40 MG PO TABS: 40.0000 mg | ORAL_TABLET | Freq: Two times a day (BID) | ORAL | 1 refills | Status: DC

## 2021-05-06 MED ORDER — SALINE SPRAY 0.65 % NA SOLN: 1.0000 | NASAL | 0 refills | Status: AC | PRN

## 2021-05-06 MED ORDER — DICLOFENAC SODIUM 1 % EX GEL: 2.0000 g | Freq: Four times a day (QID) | CUTANEOUS | Status: DC

## 2021-05-06 MED ORDER — ACETAMINOPHEN 325 MG PO TABS: 650.0000 mg | ORAL_TABLET | Freq: Four times a day (QID) | ORAL | Status: DC | PRN

## 2021-05-06 MED ORDER — POLYETHYLENE GLYCOL 3350 17 G PO PACK: 17.0000 g | PACK | Freq: Two times a day (BID) | ORAL | 1 refills | Status: AC

## 2021-05-06 MED ORDER — LORATADINE 10 MG PO TABS: 10.0000 mg | ORAL_TABLET | Freq: Every day | ORAL | 0 refills | Status: AC | PRN

## 2021-05-06 NOTE — Discharge Summary (Signed)
Physician Discharge Summary  Amber Herring NUU:[id number] DOB: 1960/08/20 DOA: 04/16/2021  PCP: Denita Lung, MD  Admit date: 04/16/2021 Discharge date: 05/06/2021  Time spent: 60 minutes  Recommendations for Outpatient Follow-up:  Follow-up at the [facility] 06/15/2021 to establish PCP and hospital follow-up.  On follow-up patient will need basic metabolic profile, magnesium level to follow-up on electrolytes and renal function.  Patient will need a CBC done to follow-up on H&H.  Patient's constipation also need to be followed up upon. Follow-up with Sande Rives, PA cardiology on 05/18/2021 at 8:25 AM as scheduled for follow-up on atrial flutter, CHF. Follow-up with Dr. Radene Gunning, OB/GYN in 2 to 3 weeks for follow-up and further evaluation of postmenopausal bleeding.   Discharge Diagnoses:  Principal Problem:   Anasarca Active Problems:   Urinary tract infection with hematuria   Morbid obesity (HCC)   Vaginal bleeding   Iron deficiency anemia due to chronic blood loss   Cardiomegaly   Chest pain   Atrial flutter (HCC)   CAP (community acquired pneumonia)   Pressure injury of skin   Acute on chronic combined systolic and diastolic CHF (congestive heart failure) (HCC)   Constipation   Postmenopausal vaginal bleeding   Discharge Condition: Stable and improved  Diet recommendation: Heart healthy  Filed Weights   05/03/21 0500 05/04/21 0500 05/05/21 0500  Weight: 127.9 kg 133.8 kg 123.4 kg    History of present illness:  HPI per Dr. [name] is a 61 y.o. female with medical history significant of Obesity , depression anemia     Presented with   swelling and vaginal bleeding Generalized swelling legs and abd, SOB  Episode of CP Reported heamturia \no cough no fever  She is a truck driver She was supposed to have a sleep study but kept getting up to pee Reports vaginal bleeding that comes and goes She  stopped having periods 5 years ago But started to have some spotting Recently vaginal bleeding became more severe No fever   Has  been vaccinated against COVID  no flu shot    Initial COVID TEST  NEGATIVE  Hospital Course:  #1 atrial flutter/atrial ectopy -Felt likely contributed to CHF exacerbation, patient started on Eliquis status post TEE cardioversion 04/26/2021. -Patient maintained on amiodarone, metoprolol, Eliquis per cardiology recommendations. -Patient was being followed by cardiology who have signed off. -Patient assessed by PT is noted patient cannot go to SNF and will need to go home with home health therapies. -Outpatient follow-up with cardiology.  2.  New onset dizziness/tachycardia -Patient denies any chest pain, no shortness of breath.  EKG with right axis deviation. -Patient in sinus rhythm. -Resolved.  3.  Anasarca secondary to acute systolic heart failure -Patient seen in consultation by cardiology. -Patient placed on Bumex at a reduced dose due to hypotension. -Patient also maintained on amiodarone, Toprol in addition to Bumex.  -Outpatient follow-up with cardiology.    4.  Hyperkalemia -Resolved by day of discharge.   5.  Hypomagnesemia -Repleted.  6.  Postmenopausal vaginal bleeding -Pelvic ultrasound with no acute findings. -Patient seen in consultation by GYN and patient started on Megace, outpatient follow-up.  7.  Iron deficiency anemia -Status post IV iron. -Hemoglobin remained stable.   -Patient was discharged on oral iron supplementation.   -Outpatient follow-up with PCP.  8.  Sacral decubitus ulcer stage II, POA   -Patient maintained on dressing changes     #9.  Morbid obesity -  Lifestyle modification.  10.  Constipation Placed on MiraLAX twice daily, Senokot-S twice daily, patient also given sorbitol with good results. -Patient will be discharged home on a bowel regimen of MiraLAX twice daily, Senokot-S twice daily. -Outpatient  follow-up with PCP.    Procedures: CT abdomen and pelvis 04/16/2021 Chest x-ray 04/16/2021 Plain films of bilateral knees 04/30/2021 Pelvic ultrasound 04/17/2021 2D echo 04/17/2021 TEE 04/26/2021 Lower extremity Dopplers 04/17/2022 TEE cardioversion 04/26/2021  Consultations: OB/GYN: Dr. Damita Dunnings 04/21/2021 Cardiology: Dr. Debara Pickett 04/17/2021  Discharge Exam: Vitals:   05/06/21 1006 05/06/21 1227  BP: (!) 105/56 113/67  Pulse: 64 (!) 58  Resp:  20  Temp:  98.2 F (36.8 C)  SpO2: 97% 98%    General: NAD Cardiovascular: Regular rate and rhythm no murmurs rubs or gallops.  No JVD.  No lower extremity edema. Respiratory: Lungs clear to auscultation bilaterally.  No wheezes, no crackles, no rhonchi.  Fair air movement.  Speaking in full sentences.  Discharge Instructions   Discharge Instructions     Diet - low sodium heart healthy   Complete by: As directed    Discharge wound care:   Complete by: As directed    As above   Increase activity slowly   Complete by: As directed       Allergies as of 05/06/2021   No Known Allergies      Medication List     STOP taking these medications    ibuprofen 200 MG tablet Commonly known as: ADVIL       TAKE these medications    acetaminophen 325 MG tablet Commonly known as: TYLENOL Take 2 tablets (650 mg total) by mouth every 6 (six) hours as needed for mild pain (or Fever >/= 101).   Adult One Daily Gummies Chew Chew 2 tablets by mouth every morning.   amiodarone 200 MG tablet Commonly known as: PACERONE Take 1 tablet (200 mg total) by mouth daily. Start taking on: May 07, 2021   apixaban 5 MG Tabs tablet Commonly known as: ELIQUIS Take 1 tablet (5 mg total) by mouth 2 (two) times daily.   APPLE CIDER VINEGAR PO Take 1 tablet by mouth every morning. gummies   bumetanide 1 MG tablet Commonly known as: BUMEX Take 1 tablet (1 mg total) by mouth 2 (two) times daily.   capsaicin 0.025 % cream Commonly known as:  ZOSTRIX Apply topically 2 (two) times daily.   diclofenac Sodium 1 % Gel Commonly known as: VOLTAREN Apply 2 g topically 4 (four) times daily.   ferrous sulfate 325 (65 FE) MG tablet Take 1 tablet (325 mg total) by mouth 3 (three) times daily with meals.   loratadine 10 MG tablet Commonly known as: CLARITIN Take 1 tablet (10 mg total) by mouth daily as needed for allergies.   megestrol 40 MG tablet Commonly known as: MEGACE Take 1 tablet (40 mg total) by mouth 2 (two) times daily.   metoprolol succinate 25 MG 24 hr tablet Commonly known as: TOPROL-XL Take 0.5 tablets (12.5 mg total) by mouth daily. Start taking on: May 07, 2021   OVER THE COUNTER MEDICATION Take 1 capsule by mouth 2 (two) times daily. Beet root capsule   OVER THE COUNTER MEDICATION Take 2 tablets by mouth every morning. Omega XL - purchased online   OVER THE COUNTER MEDICATION Apply 1 application topically 3 (three) times daily as needed (knee pain). Otc foam for knee pain   pantoprazole 40 MG tablet Commonly known as: PROTONIX Take 1 tablet (  40 mg total) by mouth daily. Start taking on: May 07, 2021   polyethylene glycol 17 g packet Commonly known as: MIRALAX / GLYCOLAX Take 17 g by mouth 2 (two) times daily.   Primatene Mist 0.125 MG/ACT Aero Generic drug: EPINEPHrine Inhale 1 puff into the lungs daily as needed (wheezing).   senna-docusate 8.6-50 MG tablet Commonly known as: Senokot-S Take 1 tablet by mouth 2 (two) times daily.   sodium chloride 0.65 % Soln nasal spray Commonly known as: OCEAN Place 1 spray into both nostrils as needed for congestion (nose irritation).   traMADol 50 MG tablet Commonly known as: ULTRAM Take 1 tablet (50 mg total) by mouth every 6 (six) hours as needed for moderate pain.               Discharge Care Instructions  (From admission, onward)           Start     Ordered   05/06/21 0000  Discharge wound care:       Comments: As above    05/06/21 1549           No Known Allergies  Follow-up Information     [facility] Follow up on 06/15/2021.   Why: Your hospital follow up appointment is scheduled on Tuesday June 15, 2021 at 2:30pm. You will see Dr. Wynetta Emery. Contact information: [place] [id number] [facility].   Why: PT and SW        Darreld Mclean, PA-C Follow up on 05/18/2021.   Specialties: Physician Assistant, Cardiology Why: Follow-up as scheduled at 8:25 AM. Contact information: [address] [place] [address] [phone]         Radene Gunning, MD. Schedule an appointment as soon as possible for a visit in 2 week(s).   Specialty: Obstetrics and Gynecology Why: Follow-up in 2 to 3 weeks. Contact information: [place] [address] [phone]                  The results of significant diagnostics from this hospitalization (including imaging, microbiology, ancillary and laboratory) are listed below for reference.    Significant Diagnostic Studies: DG Knee 1-2 Views Left  Result Date: 04/30/2021 CLINICAL DATA:  Chronic bilateral anterior knee pain EXAM: RIGHT KNEE - 1-2 VIEW; LEFT KNEE - 1-2 VIEW COMPARISON:  None. FINDINGS: Left knee: Severe joint space loss, spurring, subchondral sclerosis of the medial compartment. Lucency in the cortex of the medial femoral condyle indicative of prior subchondral injury. Moderate joint space loss and spurring of the patellofemoral compartment. Mild spurring of the lateral compartment. Right knee: Moderate joint space loss, spurring, subchondral cystic changes seen in the medial compartment. Mild spurring of the lateral compartment. Moderate spurring and joint space loss of the patellofemoral compartment. IMPRESSION: Bilateral knee osteoarthrosis, greatest involving the medial compartment of the left  knee. Electronically Signed   By: Miachel Roux M.D.   On: 04/30/2021 13:35   DG Knee 1-2 Views Right  Result Date: 04/30/2021 CLINICAL DATA:  Chronic bilateral anterior knee pain EXAM: RIGHT KNEE - 1-2 VIEW; LEFT KNEE - 1-2 VIEW COMPARISON:  None. FINDINGS: Left knee: Severe joint space loss, spurring, subchondral sclerosis of the medial compartment. Lucency in the cortex of the medial femoral condyle indicative of prior subchondral injury. Moderate joint space loss and spurring of the patellofemoral compartment. Mild spurring  of the lateral compartment. Right knee: Moderate joint space loss, spurring, subchondral cystic changes seen in the medial compartment. Mild spurring of the lateral compartment. Moderate spurring and joint space loss of the patellofemoral compartment. IMPRESSION: Bilateral knee osteoarthrosis, greatest involving the medial compartment of the left knee. Electronically Signed   By: Miachel Roux M.D.   On: 04/30/2021 13:35   US PELVIS (TRANSABDOMINAL ONLY)  Result Date: 04/17/2021 CLINICAL DATA:  Initial evaluation for vaginal bleeding. EXAM: TRANSABDOMINAL ULTRASOUND OF PELVIS TECHNIQUE: Transabdominal ultrasound examination of the pelvis was performed including evaluation of the uterus, ovaries, adnexal regions, and pelvic cul-de-sac. COMPARISON:  Prior CT from earlier the same day. FINDINGS: Examination technically limited by body habitus and patient's inability to position for the exam. Uterus Measurements: 9.3 x 3.1 x 5.7 cm = volume: A 7.6 mL. Uterus is anteverted. No visible discrete fibroid or other myometrial abnormality on this limited exam. Endometrium Not visualized on this transabdominal only exam. Right ovary Not visualized.  No adnexal mass. Left ovary Not visualized.  No adnexal mass. Other findings: Moderate volume free fluid seen within the pelvis, corresponding with appearance on prior CT. IMPRESSION: 1. Technically limited exam with nonvisualization of the  endometrium. If there remains clinical concern for possible underlying occult endometrial pathology, further assessment with dedicated sonohysterography could be performed for further evaluation as warranted. Correlation with dedicated pelvic MRI may potentially be helpful as well. 2. Grossly normal sonographic appearance of the uterus. 3. Nonvisualization of either ovary.  No adnexal mass. 4. Moderate volume free fluid within the pelvis, corresponding with appearance on prior CT. Electronically Signed   By: Jeannine Boga M.D.   On: 04/17/2021 00:46   CT Abdomen Pelvis W Contrast  Result Date: 04/16/2021 CLINICAL DATA:  Abdominal pain EXAM: CT ABDOMEN AND PELVIS WITH CONTRAST TECHNIQUE: Multidetector CT imaging of the abdomen and pelvis was performed using the standard protocol following bolus administration of intravenous contrast. CONTRAST:  162m OMNIPAQUE IOHEXOL 350 MG/ML SOLN COMPARISON:  07/13/2005 FINDINGS: Lower chest: Small bilateral pleural effusions are seen, more so on the right side. There are linear patchy infiltrates in both lower lung fields, more so on the right side. Heart is enlarged in size. Hepatobiliary: Liver measures 21.9 cm in length. There is no dilation of bile ducts. Gallbladder is unremarkable. Pancreas: No focal abnormality is seen. Spleen: Unremarkable. Adrenals/Urinary Tract: Adrenals are unremarkable. There is no hydronephrosis. There is 9 mm calculus in the lower pole of left kidney. There are no demonstrable opaque ureteral stones. Urinary bladder is not distended. Stomach/Bowel: Stomach is not distended. Small bowel loops are not dilated. Appendix is not distinctly seen. There is no wall thickening in colon. Vascular/Lymphatic: There are scattered arterial calcifications. There are slightly enlarged lymph nodes in the retroperitoneum and mesentery. Reproductive: Unremarkable. Other: There is no pneumoperitoneum. Small ascites is present. There is diffuse edema in the  subcutaneous plane in the abdominal wall. Small umbilical hernia containing fat is seen. Musculoskeletal: Degenerative changes are noted in the lumbar spine, more so at L5-S1 level. Degenerative changes are noted in the lower thoracic spine. IMPRESSION: There is no evidence intestinal obstruction or pneumoperitoneum. There is no hydronephrosis. There is 9 mm calculus in the lower pole of left kidney. Small ascites. There is diffuse edema in the subcutaneous plane in the abdominal wall suggesting anasarca. Small bilateral pleural effusions are seen, more so on the right side. There are patchy infiltrates in both lower lung fields suggesting atelectasis/pneumonia. Cardiomegaly. Other findings as described  in the body of the report. Electronically Signed   By: Elmer Picker M.D.   On: 04/16/2021 19:46   DG CHEST PORT 1 VIEW  Result Date: 04/16/2021 CLINICAL DATA:  Hematuria x2 days and SOB off and on for 5 days, she also states that blood may be coming out of her vagina. Pt denies dysuria. EXAM: PORTABLE CHEST 1 VIEW COMPARISON:  Chest x-ray 01/25/2021 FINDINGS: Enlarged cardiac silhouette. The heart and mediastinal contours are unchanged. Bilateral mid to lower lung zone airspace opacities. No pulmonary edema. No pleural effusion. No pneumothorax. No acute osseous abnormality. IMPRESSION: 1. Persistent enlarged cardiac silhouette with underlying pericardial effusion not excluded. 2. Bilateral mid to lower lung airspace opacities that may represent a combination of atelectasis, infection, inflammation. Electronically Signed   By: Iven Finn M.D.   On: 04/16/2021 23:55   ECHOCARDIOGRAM COMPLETE  Result Date: 04/17/2021    ECHOCARDIOGRAM REPORT   Patient Name:   Amber Herring Date of Exam: 04/17/2021 Medical Rec #:  [id number]        Height:       69.0 in Accession #:    [id number]       Weight:       350.0 lb Date of Birth:  [birth year]        BSA:          2.621 m Patient Age:    61 years          BP:           100/70 mmHg Patient Gender: F                HR:           110 bpm. Exam Location:  Inpatient Procedure: 2D Echo, Cardiac Doppler, Color Doppler and Intracardiac            Opacification Agent Indications:    Cardiomegaly I51.7  History:        Patient has no prior history of Echocardiogram examinations.                 Risk Factors:Dyslipidemia. GERD.  Sonographer:    Darlina Sicilian RDCS Referring Phys: [place]  Sonographer Comments: Technically difficult study due to poor echo windows. Image acquisition challenging due to patient body habitus. IMPRESSIONS  1. Left ventricular ejection fraction, by estimation, is 45 to 50%. Left ventricular ejection fraction by 2D MOD biplane is 46.2 %. The left ventricle has mildly decreased function. The left ventricle demonstrates global hypokinesis. Indeterminate diastolic filling due to E-A fusion.  2. Right ventricular systolic function is normal. The right ventricular size is moderately enlarged. There is mildly elevated pulmonary artery systolic pressure. The estimated right ventricular systolic pressure is 32.4 mmHg.  3. Right atrial size was severely dilated.  4. The mitral valve is abnormal. Mild mitral valve regurgitation.  5. Tricuspid valve regurgitation is mild to moderate.  6. The aortic valve is tricuspid. Aortic valve regurgitation is not visualized.  7. The inferior vena cava is dilated in size with <50% respiratory variability, suggesting right atrial pressure of 15 mmHg. Comparison(s): No prior Echocardiogram. FINDINGS  Left Ventricle: Left ventricular ejection fraction, by estimation, is 45 to 50%. Left ventricular ejection fraction by 2D MOD biplane is 46.2 %. The left ventricle has mildly decreased function. The left ventricle demonstrates global hypokinesis. Definity contrast agent was given IV to delineate the left ventricular endocardial borders. The left ventricular internal cavity size was normal in size. There  is no left  ventricular hypertrophy. Indeterminate diastolic filling due to E-A fusion. Right Ventricle: The right ventricular size is moderately enlarged. No increase in right ventricular wall thickness. Right ventricular systolic function is normal. There is mildly elevated pulmonary artery systolic pressure. The tricuspid regurgitant velocity is 2.35 m/s, and with an assumed right atrial pressure of 15 mmHg, the estimated right ventricular systolic pressure is 86.1 mmHg. Left Atrium: Left atrial size was normal in size. Right Atrium: Right atrial size was severely dilated. Pericardium: There is no evidence of pericardial effusion. Mitral Valve: The mitral valve is abnormal. There is mild thickening of the mitral valve leaflet(s). Mild mitral valve regurgitation, with posteriorly-directed jet. Tricuspid Valve: The tricuspid valve is grossly normal. Tricuspid valve regurgitation is mild to moderate. Aortic Valve: The aortic valve is tricuspid. Aortic valve regurgitation is not visualized. Pulmonic Valve: The pulmonic valve was grossly normal. Pulmonic valve regurgitation is trivial. Aorta: The aortic root and ascending aorta are structurally normal, with no evidence of dilitation. Venous: The inferior vena cava is dilated in size with less than 50% respiratory variability, suggesting right atrial pressure of 15 mmHg. IAS/Shunts: No atrial level shunt detected by color flow Doppler.  LEFT VENTRICLE PLAX 2D                        Biplane EF (MOD) LVIDd:         5.80 cm         LV Biplane EF:   Left LVIDs:         4.40 cm                          ventricular LV PW:         0.90 cm                          ejection LV IVS:        0.90 cm                          fraction by LVOT diam:     2.10 cm                          2D MOD LV SV:         46                               biplane is LV SV Index:   18                               46.2 %. LVOT Area:     3.46 cm  LV Volumes (MOD) LV vol d, MOD    141.0 ml A2C: LV vol d, MOD     117.0 ml A4C: LV vol s, MOD    81.6 ml A2C: LV vol s, MOD    58.5 ml A4C: LV SV MOD A2C:   59.4 ml LV SV MOD A4C:   117.0 ml LV SV MOD BP:    59.2 ml RIGHT VENTRICLE RV S prime:     11.10 cm/s TAPSE (M-mode): 1.6 cm LEFT ATRIUM             Index  RIGHT ATRIUM           Index LA diam:        4.30 cm 1.64 cm/m   RA Area:     35.80 cm LA Vol (A2C):   69.8 ml 26.63 ml/m  RA Volume:   137.00 ml 52.28 ml/m LA Vol (A4C):   80.0 ml 30.53 ml/m LA Biplane Vol: 78.1 ml 29.80 ml/m  AORTIC VALVE LVOT Vmax:   92.90 cm/s LVOT Vmean:  59.200 cm/s LVOT VTI:    0.134 m  AORTA Ao Root diam: 3.20 cm Ao Asc diam:  3.50 cm MR Peak grad:    75.2 mmHg    TRICUSPID VALVE MR Mean grad:    44.5 mmHg    TR Peak grad:   22.1 mmHg MR Vmax:         433.50 cm/s  TR Vmax:        235.00 cm/s MR Vmean:        310.0 cm/s MR PISA:         0.77 cm     SHUNTS MR PISA Eff ROA: 7 mm        Systemic VTI:  0.13 m MR PISA Radius:  0.35 cm      Systemic Diam: 2.10 cm Lyman Bishop MD Electronically signed by Lyman Bishop MD Signature Date/Time: 04/17/2021/2:32:30 PM    Final    ECHO TEE  Result Date: 04/26/2021    TRANSESOPHOGEAL ECHO REPORT   Patient Name:   Amber Herring Date of Exam: 04/26/2021 Medical Rec #:  [id number]        Height:       69.0 in Accession #:    [id number]       Weight:       353.0 lb Date of Birth:  [birth year]        BSA:          2.630 m Patient Age:    17 years         BP:           101/65 mmHg Patient Gender: F                HR:           88 bpm. Exam Location:  Inpatient Procedure: Transesophageal Echo, Cardiac Doppler and Color Doppler Indications:     R94.31 Abnormal EKG  History:         Patient has prior history of Echocardiogram examinations.                  Cardiomegaly, Abnormal ECG, Arrythmias:Atrial Flutter;                  Signs/Symptoms:Chest Pain.  Sonographer:     Roseanna Rainbow RDCS Referring Phys:  [id number] Tami Lin [facility] Diagnosing Phys: Gwyndolyn Kaufman MD PROCEDURE: After discussion of the risks  and benefits of a TEE, an informed consent was obtained from the patient. The transesophogeal probe was passed without difficulty through the esophogus of the patient. Imaged were obtained with the patient in a left lateral decubitus position. Sedation performed by different physician. The patient was monitored while under deep sedation. Anesthestetic sedation was provided intravenously by Anesthesiology: 267m of Propofol, 1065mof Lidocaine. The patient developed Respiratory depression during the procedure. A direct current cardioversion was performed at 200 joules with 1 attempt. IMPRESSIONS  1. Left ventricular ejection fraction, by estimation, is 45 to 50%. The left ventricle has low normal function.  2. Right ventricular systolic function is mildly reduced. The right ventricular size is moderately enlarged.  3. Left atrial size was mildly dilated. No left atrial/left atrial appendage thrombus was detected.  4. Right atrial size was severely dilated.  5. The mitral valve is normal in structure. Trivial mitral valve regurgitation.  6. There is severe tricuspid regurgitation likely due to annular dilation in the setting of severe RA enlargement.  7. The aortic valve is tricuspid. Aortic valve regurgitation is trivial.  8. TEE was aborted early due to patient hypoxia during examination.  9. Following TEE and improvement of hypoxia, patient was successfully cardioverted back to sinus bradycardia. FINDINGS  Left Ventricle: Left ventricular ejection fraction, by estimation, is 45 to 50%. The left ventricle has mildly decreased function. The left ventricular internal cavity size was normal in size. Right Ventricle: The right ventricular size is moderately enlarged. No increase in right ventricular wall thickness. Right ventricular systolic function is mildly reduced. Left Atrium: Left atrial size was mildly dilated. No left atrial/left atrial appendage thrombus was detected. Right Atrium: Right atrial size was  severely dilated. Pericardium: There is no evidence of pericardial effusion. Mitral Valve: The mitral valve is normal in structure. There is mild thickening of the mitral valve leaflet(s). There is mild calcification of the mitral valve leaflet(s). Trivial mitral valve regurgitation. Tricuspid Valve: There is severe tricuspid regurgitation likely due to annular dilation in the setting of severe RA enlargement. The tricuspid valve is normal in structure. Tricuspid valve regurgitation is severe. Aortic Valve: The aortic valve is tricuspid. Aortic valve regurgitation is trivial. Pulmonic Valve: The pulmonic valve was normal in structure. Pulmonic valve regurgitation is trivial. Aorta: Aortic root could not be assessed. IAS/Shunts: The interatrial septum was not assessed. Gwyndolyn Kaufman MD Electronically signed by Gwyndolyn Kaufman MD Signature Date/Time: 04/26/2021/5:02:44 PM    Final (Updated)    VAS [place] LOWER EXTREMITY VENOUS (DVT)  Result Date: 04/17/2021  Lower Venous DVT Study Patient Name:  Amber Herring  Date of Exam:   04/17/2021 Medical Rec #: [id number]         Accession #:    [id number] Date of Birth: 12/23/1960         Patient Gender: F Patient Age:   35 years Exam Location:  [facility] Procedure:      VAS [place] LOWER EXTREMITY VENOUS (DVT) Referring Phys: Nyoka Lint DOUTOVA --------------------------------------------------------------------------------  Indications: Edema.  Limitations: Body habitus and poor ultrasound/tissue interface. Performing Technologist: Archie Patten RVS  Examination Guidelines: A complete evaluation includes B-mode imaging, spectral Doppler, color Doppler, and power Doppler as needed of all accessible portions of each vessel. Bilateral testing is considered an integral part of a complete examination. Limited examinations for reoccurring indications may be performed as noted. The reflux portion of the exam is performed with the patient in reverse Trendelenburg.   +---------+---------------+---------+-----------+----------+-------------------+  RIGHT     Compressibility Phasicity Spontaneity Properties Thrombus Aging       +---------+---------------+---------+-----------+----------+-------------------+  CFV       Full            Yes       Yes                                         +---------+---------------+---------+-----------+----------+-------------------+  SFJ       Full                                                                  +---------+---------------+---------+-----------+----------+-------------------+  FV Prox   Full                                                                  +---------+---------------+---------+-----------+----------+-------------------+  FV Mid                                                     Not well visualized  +---------+---------------+---------+-----------+----------+-------------------+  FV Distal                                                  Not well visualized  +---------+---------------+---------+-----------+----------+-------------------+  PFV       Full                                                                  +---------+---------------+---------+-----------+----------+-------------------+  POP       Full                                                                  +---------+---------------+---------+-----------+----------+-------------------+  PTV                                                        patent by color      +---------+---------------+---------+-----------+----------+-------------------+  PERO                                                       Not well visualized  +---------+---------------+---------+-----------+----------+-------------------+   +---------+---------------+---------+-----------+----------+-------------------+  LEFT      Compressibility Phasicity Spontaneity Properties Thrombus Aging       +---------+---------------+---------+-----------+----------+-------------------+   CFV       Full            Yes       Yes                                         +---------+---------------+---------+-----------+----------+-------------------+  SFJ       Full                                                                  +---------+---------------+---------+-----------+----------+-------------------+  FV Prox   Full                                                                  +---------+---------------+---------+-----------+----------+-------------------+  FV Mid                                                     Not well visualized  +---------+---------------+---------+-----------+----------+-------------------+  FV Distal                                                  Not well visualized  +---------+---------------+---------+-----------+----------+-------------------+  PFV       Full                                                                  +---------+---------------+---------+-----------+----------+-------------------+  POP       Full                                                                  +---------+---------------+---------+-----------+----------+-------------------+  PTV                                                        Not well visualized  +---------+---------------+---------+-----------+----------+-------------------+  PERO                                                       Not well visualized  +---------+---------------+---------+-----------+----------+-------------------+     Summary: RIGHT: - There is no evidence of deep vein thrombosis in the lower extremity. However, portions of this examination were limited- see technologist comments above.  - No cystic structure found in the popliteal fossa.  LEFT: - There is no evidence of deep vein thrombosis in the lower extremity. However, portions of this examination were limited- see technologist comments above.  - No cystic structure found in the popliteal fossa.  *See table(s) above for measurements and  observations. Electronically signed by Monica Martinez MD on 04/17/2021 at 7:09:27 PM.    Final     Microbiology: No results found for this or any previous visit (from the past 240 hour(s)).   Labs: Basic Metabolic Panel: Recent Labs  Lab 04/30/21 0828 05/03/21 0941 05/05/21 1232 05/05/21 1345 05/06/21 0444  NA 132* 134* 132*  --  137  K 4.0 3.8 5.6* 4.3 3.9  CL 98 99 96*  --  94*  CO2 _0 --  24  GLUCOSE 95 106* 95  --  135*  BUN 26* 23* 21*  --  24*  CREATININE 0.81 0.84 1.01*  --  0.82  CALCIUM 9.0 9.0 9.4  --  9.9  MG 2.0  --  2.0  --  1.9   Liver Function Tests: No results for input(s): AST, ALT, ALKPHOS, BILITOT, PROT, ALBUMIN in the last 168 hours. No results for input(s): LIPASE, AMYLASE in the last 168 hours. No results for input(s): AMMONIA in the last 168 hours. CBC: Recent Labs  Lab 05/05/21 0448  WBC 7.1  HGB 13.7  HCT 45.6  MCV 80.3  PLT 388   Cardiac Enzymes: No results for input(s): CKTOTAL, CKMB, CKMBINDEX, TROPONINI in the last 168 hours. BNP: BNP (last 3 results) Recent Labs    01/25/21 1521 04/16/21 1722  BNP 182.4* 322.0*    ProBNP (last 3 results) No results for input(s): PROBNP in the last 8760 hours.  CBG: Recent Labs  Lab 05/03/21 0902  GLUCAP 114*       Signed:  Irine Seal MD.  [facility] 05/06/2021, 4:00 PM

## 2021-05-06 NOTE — Plan of Care (Signed)

## 2021-05-10 NOTE — Progress Notes (Deleted)
Cardiology Office Note:    Date:  05/10/2021   ID:  Amber Herring, DOB 01-10-1961, MRN [id number]  PCP:  Denita Lung, MD  Cardiologist:  Pixie Casino, MD  Electrophysiologist:  None   Referring MD: Denita Lung, MD   Chief Complaint: hospital follow-up of CHF and atrial flutter  History of Present Illness:    Amber Herring is a 61 y.o. female with a history of chronic CHF with mildly reduced EF of 45-50% on recent Echo in 03/2021 atrial flutter s/p recent DCCV on 04/26/2021 on Eliquis, hyperlipidemia, GERD, iron deficiency anemia, and obesity who is followed by Dr. Debara Pickett and presents today for hospital follow-up of CHF and atrial flutter.  Patient was recently admitted from 04/16/2021 to 05/06/2021 with anasarca after presenting with generalized edema and worsening shortness of breath and chest pain.  On admission, she was also noted to be in atrial flutter with rates in the 110s.  High-sensitivity troponin was negative x2.  BNP mildly elevated at 322.  Echo showed LVEF of 45-50% with global hypokinesis, moderately enlarged RV with normal systolic function, mild MR, mild to moderate TR, and mildly elevated PASP of 37.1 mmHg.  She was diuresed with IV Lasix, IV amiodarone, and Eliquis.  She underwent her underwent TEE-DCCV on 04/26/2021 with restoration of sinus rhythm.  He did develop hypotension after procedure and required a dose of Ephedrine and Neo with improvement. Discharge weight was 272 lbs. Patient was discharged on Bumex 1 mg twice daily, amiodarone 200 mg daily, Toprol-XL 12.5 mg daily, and Eliquis 5 mg twice daily.  Of note, patient also reported vaginal bleeding on admission and cannot Gynecology was consulted and started her on Megace.  Patient presents today for follow-up ***.  Chronic CHF with Mildly Reduced EF Patient recently admitted with acute CHF after presenting with anasarca.  Echo showed LVEF of 45-50% with global hypokinesis, moderately enlarged RV with  normal systolic function, mild MR, mild to moderate TR, and mildly elevated PASP of 37.1 mmHg.  Patient was aggressively diuresed. - *** - Continue Bumex 1 mg twice daily. - Continue Toprol-XL 12.5 mg daily. - Discussed importance of daily weights and sodium/fluid restrictions. - Cardiomyopathy may be tachymediated in nature due to atrial flutter.  Recommend repeat echo in 2 to 3 months after restoration of sinus rhythm.  If EF is still low at that time, may need to consider ischemic evaluation. ***  Paroxysmal Atrial Flutter Patient was found to be in rapid atrial flutter on recent admission.  S/p TEE/DCCV on 04/26/2021 with restoration of sinus rhythm. - Maintaining sinus rhythm today. - Continue Toprol-XL 12.10m daily. - Continue Amiodarone 2061mdaily. - Continue Eliquis 33m59mwice daily.  - Given age, would ideally like to avoid patient being on Amiodarone long-term.  May be able to stop this after a few months if no recurrence.  Will defer to primary cardiologist.  Severe Tricuspid Regurgitation Right Ventricle and Right Atrial Enlargement TTE during recent admission showed moderately enlarged RV with normal systolic function, mild to moderate TR, and mildly elevated PASP of 37.1 mmHg.  However, TEE showed mildly reduced RV systolic function and severe TR which was felt to likely be due to annular dilatation in the setting of severe right atrial enlargement. - Patient morbidly obese and there is concern for sleep apnea.  Will order sleep study. *** - We can reassess TR as well when we get repeat Echo in 2-69mo50monthr reevaluation of LV function.  Hyperlipidemia  Hyperlipidemia listed in chart but not on any medications.  Last lipid panel from ***  Morbid Obesity BMI *** -    Past Medical History:  Diagnosis Date   Allergy    Anemia    iron defienency   Anxiety    GERD (gastroesophageal reflux disease)    Hyperlipidemia    Impaired fasting glucose    Metabolic syndrome     Obesity     Past Surgical History:  Procedure Laterality Date   CARDIOVERSION N/A 04/26/2021   Procedure: CARDIOVERSION;  Surgeon: Freada Bergeron, MD;  Location: [facility] ENDOSCOPY;  Service: Cardiovascular;  Laterality: N/A;   COLONOSCOPY  04-2004   KNEE ARTHROSCOPY     TEE WITHOUT CARDIOVERSION N/A 04/26/2021   Procedure: TRANSESOPHAGEAL ECHOCARDIOGRAM (TEE);  Surgeon: Freada Bergeron, MD;  Location: [facility] ENDOSCOPY;  Service: Cardiovascular;  Laterality: N/A;    Current Medications: No outpatient medications have been marked as taking for the 05/18/21 encounter (Appointment) with Amber Mclean, PA-C.     Allergies:   Patient has no known allergies.   Social History   Socioeconomic History   Marital status: Single    Spouse name: Not on file   Number of children: Not on file   Years of education: Not on file   Highest education level: Not on file  Occupational History   Not on file  Tobacco Use   Smoking status: Former    Types: Cigarettes   Smokeless tobacco: Never  Vaping Use   Vaping Use: Never used  Substance and Sexual Activity   Alcohol use: Never   Drug use: Never   Sexual activity: Not Currently  Other Topics Concern   Not on file  Social History Narrative   Not on file   Social Determinants of Health   Financial Resource Strain: Not on file  Food Insecurity: Not on file  Transportation Needs: Not on file  Physical Activity: Not on file  Stress: Not on file  Social Connections: Not on file     Family History: The patient's family history includes ALS in her mother.  ROS:   Please see the history of present illness.     EKGs/Labs/Other Studies Reviewed:    The following studies were reviewed today:  TTE 04/17/2021: Impressions:  1. Left ventricular ejection fraction, by estimation, is 45 to 50%. Left  ventricular ejection fraction by 2D MOD biplane is 46.2 %. The left  ventricle has mildly decreased function. The left ventricle  demonstrates  global hypokinesis. Indeterminate  diastolic filling due to E-A fusion.   2. Right ventricular systolic function is normal. The right ventricular  size is moderately enlarged. There is mildly elevated pulmonary artery  systolic pressure. The estimated right ventricular systolic pressure is  37.9 mmHg.   3. Right atrial size was severely dilated.   4. The mitral valve is abnormal. Mild mitral valve regurgitation.   5. Tricuspid valve regurgitation is mild to moderate.   6. The aortic valve is tricuspid. Aortic valve regurgitation is not  visualized.   7. The inferior vena cava is dilated in size with <50% respiratory  variability, suggesting right atrial pressure of 15 mmHg.   Comparison(s): No prior Echocardiogram. _______________  TEE/DCCV 04/26/2021: Impressions:  1. Left ventricular ejection fraction, by estimation, is 45 to 50%. The  left ventricle has low normal function.   2. Right ventricular systolic function is mildly reduced. The right  ventricular size is moderately enlarged.   3. Left atrial size was  mildly dilated. No left atrial/left atrial  appendage thrombus was detected.   4. Right atrial size was severely dilated.   5. The mitral valve is normal in structure. Trivial mitral valve  regurgitation.   6. There is severe tricuspid regurgitation likely due to annular dilation  in the setting of severe RA enlargement.   7. The aortic valve is tricuspid. Aortic valve regurgitation is trivial.   8. TEE was aborted early due to patient hypoxia during examination.   9. Following TEE and improvement of hypoxia, patient was successfully  cardioverted back to sinus bradycardia.   EKG:  EKG ordered today. EKG personally reviewed and demonstrates ***.  Recent Labs: 04/16/2021: B Natriuretic Peptide 322.0 04/17/2021: TSH 2.511 04/18/2021: ALT 16 05/05/2021: Hemoglobin 13.7; Platelets 388 05/06/2021: BUN 24; Creatinine, Ser 0.82; Magnesium 1.9; Potassium 3.9;  Sodium 137  Recent Lipid Panel No results found for: CHOL, TRIG, HDL, CHOLHDL, VLDL, LDLCALC, LDLDIRECT  Physical Exam:    Vital Signs: LMP  (LMP Unknown)     Wt Readings from Last 3 Encounters:  05/05/21 272 lb (123.4 kg)  02/02/21 (!) 360 lb (163.3 kg)  01/25/21 (!) 360 lb (163.3 kg)     General: 61 y.o. female in no acute distress. HEENT: Normocephalic and atraumatic. Sclera clear. EOMs intact. Neck: Supple. No carotid bruits. No JVD. Heart: *** RRR. Distinct S1 and S2. No murmurs, gallops, or rubs. Radial and distal pedal pulses 2+ and equal bilaterally. Lungs: No increased work of breathing. Clear to ausculation bilaterally. No wheezes, rhonchi, or rales.  Abdomen: Soft, non-distended, and non-tender to palpation. Bowel sounds present in all 4 quadrants.  MSK: Normal strength and tone for age. *** Extremities: No lower extremity edema.    Skin: Warm and dry. Neuro: Alert and oriented x3. No focal deficits. Psych: Normal affect. Responds appropriately.   Assessment:    No diagnosis found.  Plan:     Disposition: Follow up in ***   Medication Adjustments/Labs and Tests Ordered: Current medicines are reviewed at length with the patient today.  Concerns regarding medicines are outlined above.  No orders of the defined types were placed in this encounter.  No orders of the defined types were placed in this encounter.   There are no Patient Instructions on file for this visit.   Signed, Amber Mclean, PA-C  05/10/2021 11:34 AM     [facility]

## 2021-05-17 NOTE — Progress Notes (Deleted)
Office Visit    Patient Name: Amber Herring Date of Encounter: 05/17/2021  Primary Care Provider:  Denita Lung, MD Primary Cardiologist:  Pixie Casino, MD  Chief Complaint    61 year old female with a history of chronic systolic heart failure (HFmrEf), atypical atrial flutter s/p DCCV on Eliquis, iron deficiency anemia, GERD, anxiety, depression, and obesity who presents for post-hospital follow-up related to atrial flutter and heart failure.   Past Medical History    Past Medical History:  Diagnosis Date   Allergy    Anemia    iron defienency   Anxiety    GERD (gastroesophageal reflux disease)    Hyperlipidemia    Impaired fasting glucose    Metabolic syndrome    Obesity    Past Surgical History:  Procedure Laterality Date   CARDIOVERSION N/A 04/26/2021   Procedure: CARDIOVERSION;  Surgeon: Freada Bergeron, MD;  Location: [facility] ENDOSCOPY;  Service: Cardiovascular;  Laterality: N/A;   COLONOSCOPY  04-2004   KNEE ARTHROSCOPY     TEE WITHOUT CARDIOVERSION N/A 04/26/2021   Procedure: TRANSESOPHAGEAL ECHOCARDIOGRAM (TEE);  Surgeon: Freada Bergeron, MD;  Location: [facility] ENDOSCOPY;  Service: Cardiovascular;  Laterality: N/A;    Allergies  No Known Allergies  History of Present Illness    61 year old female with the above past medical history including chronic systolic heart failure (HFmrEF), atypical atrial flutter s/p DCCV on Eliquis, iron deficiency anemia, GERD, anxiety, depression, and obesity.  She presented to the ED on 04/16/2021 with chest pain, worsening shortness of breath, persistent generalized edema, weight gain, and vaginal bleeding. Chest x-ray showed enlarged cardiac silhouette, CT abdomen pelvis showed bilateral pleural effusions and cardiomegaly. She ruled out for pneumonia. BNP was elevated at 322, troponin was negative.  EKG showed atrial flutter. Echocardiogram showed an EF of 45 to 50%, LV global hypokinesis, mildly decreased LV function,  moderately enlarged RV, mildly elevated PASP severely dilated RA mild MR, mild-moderate TR. She was hospitalized from 04/16/2020 05/06/2021. Cardiology was consulted in the setting of newly diagnosed CHF and atrial flutter. She was diuresed with Lasix, started on amiodarone, metoprolol, and Eliquis. She underwent TEE/DCCV on 04/26/21 with restoration of normal sinus rhythm.  GYN was also consulted in the setting of postmenopausal vaginal bleeding. Pelvic ultrasound was unremarkable.  She received IV iron in setting of iron deficiency anemia. Also noted to have a stage II sacral decubitus ulcer.  Patient was assessed by PT, not approved for SNF. She was discharged on Bumex at a reduced dose due to hypotension with home health.   She presents today for follow-up.  Since her hospitalization  Chronic systolic heart failure: Atrial flutter: Hypotension: Iron deficiency anemia: Obesity: Disposition:  Home Medications    Current Outpatient Medications  Medication Sig Dispense Refill   acetaminophen (TYLENOL) 325 MG tablet Take 2 tablets (650 mg total) by mouth every 6 (six) hours as needed for mild pain (or Fever >/= 101).     amiodarone (PACERONE) 200 MG tablet Take 1 tablet (200 mg total) by mouth daily. 30 tablet 1   apixaban (ELIQUIS) 5 MG TABS tablet Take 1 tablet (5 mg total) by mouth 2 (two) times daily. 60 tablet 1   APPLE CIDER VINEGAR PO Take 1 tablet by mouth every morning. gummies     bumetanide (BUMEX) 1 MG tablet Take 1 tablet (1 mg total) by mouth 2 (two) times daily. 60 tablet 1   capsaicin (ZOSTRIX) 0.025 % cream Apply topically 2 (two) times daily.  60 g 0   diclofenac Sodium (VOLTAREN) 1 % GEL Apply 2 g topically 4 (four) times daily.     EPINEPHrine (PRIMATENE MIST) 0.125 MG/ACT AERO Inhale 1 puff into the lungs daily as needed (wheezing).     ferrous sulfate 325 (65 FE) MG tablet Take 1 tablet (325 mg total) by mouth 3 (three) times daily with meals.  3   loratadine (CLARITIN) 10 MG  tablet Take 1 tablet (10 mg total) by mouth daily as needed for allergies. 30 tablet 0   megestrol (MEGACE) 40 MG tablet Take 1 tablet (40 mg total) by mouth 2 (two) times daily. 60 tablet 1   metoprolol succinate (TOPROL-XL) 25 MG 24 hr tablet Take 0.5 tablets (12.5 mg total) by mouth daily. 30 tablet 1   Multiple Vitamins-Minerals (ADULT ONE DAILY GUMMIES) CHEW Chew 2 tablets by mouth every morning.     OVER THE COUNTER MEDICATION Take 1 capsule by mouth 2 (two) times daily. Beet root capsule     OVER THE COUNTER MEDICATION Take 2 tablets by mouth every morning. Omega XL - purchased online     OVER THE COUNTER MEDICATION Apply 1 application topically 3 (three) times daily as needed (knee pain). Otc foam for knee pain     pantoprazole (PROTONIX) 40 MG tablet Take 1 tablet (40 mg total) by mouth daily. 30 tablet 1   polyethylene glycol (MIRALAX / GLYCOLAX) 17 g packet Take 17 g by mouth 2 (two) times daily. 72 each 1   senna-docusate (SENOKOT-S) 8.6-50 MG tablet Take 1 tablet by mouth 2 (two) times daily. 60 tablet 1   sodium chloride (OCEAN) 0.65 % SOLN nasal spray Place 1 spray into both nostrils as needed for congestion (nose irritation).  0   traMADol (ULTRAM) 50 MG tablet Take 1 tablet (50 mg total) by mouth every 6 (six) hours as needed for moderate pain. 15 tablet 0   No current facility-administered medications for this visit.     Review of Systems    ***.  All other systems reviewed and are otherwise negative except as noted above.    Physical Exam    VS:  LMP  (LMP Unknown)  , BMI There is no height or weight on file to calculate BMI.     GEN: Well nourished, well developed, in no acute distress. HEENT: normal. Neck: Supple, no JVD, carotid bruits, or masses. Cardiac: RRR, no murmurs, rubs, or gallops. No clubbing, cyanosis, edema.  Radials/DP/PT 2+ and equal bilaterally.  Respiratory:  Respirations regular and unlabored, clear to auscultation bilaterally. GI: Soft, nontender,  nondistended, BS + x 4. MS: no deformity or atrophy. Skin: warm and dry, no rash. Neuro:  Strength and sensation are intact. Psych: Normal affect.  Accessory Clinical Findings    ECG personally reviewed by me today - *** - no acute changes.  Lab Results  Component Value Date   WBC 7.1 05/05/2021   HGB 13.7 05/05/2021   HCT 45.6 05/05/2021   MCV 80.3 05/05/2021   PLT 388 05/05/2021   Lab Results  Component Value Date   CREATININE 0.82 05/06/2021   BUN 24 (H) 05/06/2021   NA 137 05/06/2021   K 3.9 05/06/2021   CL 94 (L) 05/06/2021   CO2 24 05/06/2021   Lab Results  Component Value Date   ALT 16 04/18/2021   AST 23 04/18/2021   ALKPHOS 77 04/18/2021   BILITOT 1.2 04/18/2021   No results found for: CHOL, HDL, LDLCALC, LDLDIRECT, TRIG,  CHOLHDL  No results found for: HGBA1C  Assessment & Plan    1.  ***   Lenna Sciara, NP 05/17/2021, 8:22 AM

## 2021-05-18 ENCOUNTER — Ambulatory Visit: Payer: Self-pay | Admitting: Student

## 2021-06-03 ENCOUNTER — Telehealth: Payer: Self-pay | Admitting: *Deleted

## 2021-06-03 NOTE — Telephone Encounter (Signed)
Patient called and stated that she is out of Eliquis and would like samples. She doesn't have benefits they are pending and she can't afford the medication. She stated that she has an appointment with Dr. Burt Knack on March 17,23. She has one pill left. She stated that it is hard for her to walk and EMS has to come and take her that cost is $2000.00. She can be reached at [phone]. She was crying. Thank you

## 2021-06-04 ENCOUNTER — Telehealth: Payer: Self-pay | Admitting: Internal Medicine

## 2021-06-04 ENCOUNTER — Telehealth: Payer: Self-pay

## 2021-06-04 NOTE — Telephone Encounter (Signed)
Pt c/o medication issue:  1. Name of Medication:  apixaban (ELIQUIS) 5 MG TABS tablet  2. How are you currently taking this medication (dosage and times per day)?   3. Are you having a reaction (difficulty breathing--STAT)?   4. What is your medication issue?   Patient is following up. She states her daughter went to her local [facility] and they would not accept the coupon. She would like to know if there is anything else that may be done for her to get the medication. Can samples be provided until 3/17 appointment with Jory Sims, NP? Please advise.

## 2021-06-04 NOTE — Telephone Encounter (Signed)
Patient calling the office for samples of medication:   1.  What medication and dosage are you requesting samples for? apixaban (ELIQUIS) 5 MG TABS tablet  2.  Are you currently out of this medication? She is out of the medication

## 2021-06-04 NOTE — Telephone Encounter (Signed)
Supplied pt with Eliquis samples until upcoming 06/2021 visit.

## 2021-06-04 NOTE — Telephone Encounter (Signed)
Called pt to inform her that I would be leaving her a 30 day free coupon at the front desk at [facility], for her to pick up. I advised the pt that if she has any other problems, questions or concerns, to please give our office a call back. Pt verbalized understanding.

## 2021-06-04 NOTE — Telephone Encounter (Signed)
Patient was given samples today

## 2021-06-15 ENCOUNTER — Ambulatory Visit: Payer: Self-pay | Attending: Internal Medicine | Admitting: Internal Medicine

## 2021-06-29 ENCOUNTER — Telehealth: Payer: Self-pay

## 2021-06-29 NOTE — Telephone Encounter (Signed)
Called patient to rescheduled appointment to 4/28 with K. lawrence ?

## 2021-07-02 ENCOUNTER — Ambulatory Visit: Payer: Self-pay | Admitting: Adult Health

## 2021-07-05 ENCOUNTER — Telehealth: Payer: Self-pay | Admitting: Internal Medicine

## 2021-07-05 MED ORDER — AMIODARONE HCL 200 MG PO TABS: 200.0000 mg | ORAL_TABLET | Freq: Every day | ORAL | 0 refills | Status: DC

## 2021-07-05 MED ORDER — BUMETANIDE 1 MG PO TABS: 1.0000 mg | ORAL_TABLET | Freq: Two times a day (BID) | ORAL | 0 refills | Status: DC

## 2021-07-05 NOTE — Telephone Encounter (Signed)
?*  STAT* If patient is at the pharmacy, call can be transferred to refill team. ? ? ?1. Which medications need to be refilled? (please list name of each medication and dose if known)  ?amiodarone (PACERONE) 200 MG tablet pantoprazole (PROTONIX) 40 MG tablet bumetanide (BUMEX) 1 MG tablet  megestrol (MEGACE) 40 MG tablet ? ?2. Which pharmacy/location (including street and city if local pharmacy) is medication to be sent to? ?[id number] - [place], [place] ? ?3. Do they need a 30 day or 90 day supply?  ? ?Patient states she is completely out of medication. 3/17 appointment was cancelled by the provider and rescheduled for 4/28 with Jory Sims, NP. Patient is requesting enough medication to last her until she can be seen. Please assist. ? ?

## 2021-07-05 NOTE — Telephone Encounter (Signed)
Refill for 30 Day supply of Amiodarone (Pacerone) 200 MG and Bumetanide (Bumex) 1 MG sent to [facility] pharmacy ?

## 2021-07-07 ENCOUNTER — Telehealth: Payer: Self-pay | Admitting: Adult Health

## 2021-07-07 NOTE — Telephone Encounter (Signed)
?*  STAT* If patient is at the pharmacy, call can be transferred to refill team. ? ? ?1. Which medications need to be refilled? (please list name of each medication and dose if known) Pantoprazole . Eliquis and   Megestrol ? ?2. Which pharmacy/location (including street and city if local pharmacy) is medication to be sent to?[facility] [place],  [place] ? ?3. Do they need a 30 day or 90 day supply? Need enough her appointment on 4- 28-23 ? ?

## 2021-07-29 ENCOUNTER — Telehealth: Payer: Self-pay | Admitting: Internal Medicine

## 2021-07-29 MED ORDER — AMIODARONE HCL 200 MG PO TABS: 200.0000 mg | ORAL_TABLET | Freq: Every day | ORAL | 0 refills | Status: DC

## 2021-07-29 NOTE — Telephone Encounter (Signed)
Medication samples have been provided to the patient. ? ?Drug name: Eliquis Qty: 3  LOT: [id number] Exp.Date: 2/25 ? ?Samples left at front desk for patient pick-up. ?Patient notified. ? ?

## 2021-07-29 NOTE — Telephone Encounter (Signed)
Pt c/o medication issue: ? ?1. Name of Medication: apixaban (ELIQUIS) 5 MG TABS tablet ? ?2. How are you currently taking this medication (dosage and times per day)? Take 1 tablet (5 mg total) by mouth 2 (two) times daily. ? ?3. Are you having a reaction (difficulty breathing--STAT)? no ? ?4. What is your medication issue? Patient needs samples until her appt on 4/28 ? ?

## 2021-07-29 NOTE — Telephone Encounter (Signed)
Medication was refilled today, Patient call was never removed.  ?

## 2021-07-29 NOTE — Telephone Encounter (Signed)
?*  STAT* If patient is at the pharmacy, call can be transferred to refill team. ? ? ?1. Which medications need to be refilled? (please list name of each medication and dose if known) amiodarone (PACERONE) 200 MG tablet ? ?2. Which pharmacy/location (including street and city if local pharmacy) is medication to be sent to? [id number] - [place], [place] ? ?3. Do they need a 30 day or 90 day supply? 90 ? ?

## 2021-08-03 ENCOUNTER — Other Ambulatory Visit: Payer: Self-pay | Admitting: Internal Medicine

## 2021-08-06 ENCOUNTER — Other Ambulatory Visit: Payer: Self-pay | Admitting: Internal Medicine

## 2021-08-12 NOTE — Progress Notes (Signed)
?Cardiology Clinic Note  ? ?Patient Name: Amber Herring ?Date of Encounter: 08/13/2021 ? ?Primary Care Provider:  Denita Lung, MD ?Primary Cardiologist:  Amber Casino, MD ? ?Patient Profile  ?  ?61 y.o. female with history of obesity, depression and anemia presented with generalized swelling and cardiomegaly concerning for heart failure as well as atrial flutter CHADS VASC Score 2, with admission in January 2023 for acute on chronic systolic CHF and CAP. She is now s/p TEE-guided cardioversion maintaining sinus rhythm on amiodarone. Mildly reduced EF of 45%-50% with severely dilated right atrium, chronic venous stasis. Has not been seen since discharge 05/06/2021. ?  ?Past Medical History  ?  ?Past Medical History:  ?Diagnosis Date  ? Allergy   ? Anemia   ? iron defienency  ? Anxiety   ? GERD (gastroesophageal reflux disease)   ? Hyperlipidemia   ? Impaired fasting glucose   ? Metabolic syndrome   ? Obesity   ? ?Past Surgical History:  ?Procedure Laterality Date  ? CARDIOVERSION N/A 04/26/2021  ? Procedure: CARDIOVERSION;  Surgeon: Amber Bergeron, MD;  Location: [place];  Service: Cardiovascular;  Laterality: N/A;  ? COLONOSCOPY  04-2004  ? KNEE ARTHROSCOPY    ? TEE WITHOUT CARDIOVERSION N/A 04/26/2021  ? Procedure: TRANSESOPHAGEAL ECHOCARDIOGRAM (TEE);  Surgeon: Amber Bergeron, MD;  Location: [place];  Service: Cardiovascular;  Laterality: N/A;  ? ? ?Allergies ? ?No Known Allergies ? ?History of Present Illness  ?  ?Amber Herring is a 61 year old patient we are following for ongoing assessment and management of NICM, EF of 45-50%, atrial flutter s/p TEE in 04/2021 on amiodarone, with anticoagulation with apixaban 5 mg BID. She has recently received Medicare and is going to have review at home by [facility] for assessment next week. ? ?She denies any cardiac symptoms but has significant complaints of arthritis pain and increased vaginal bleeding. She is to see PCP recommended by her Medicare  assessment.   ? ?Home Medications  ?  ?Current Outpatient Medications  ?Medication Sig Dispense Refill  ? acetaminophen (TYLENOL) 325 MG tablet Take 2 tablets (650 mg total) by mouth every 6 (six) hours as needed for mild pain (or Fever >/= 101).    ? APPLE CIDER VINEGAR PO Take 1 tablet by mouth every morning. gummies    ? capsaicin (ZOSTRIX) 0.025 % cream Apply topically 2 (two) times daily. 60 g 0  ? diclofenac Sodium (VOLTAREN) 1 % GEL Apply 2 g topically 4 (four) times daily.    ? EPINEPHrine (PRIMATENE MIST) 0.125 MG/ACT AERO Inhale 1 puff into the lungs daily as needed (wheezing).    ? ferrous sulfate 325 (65 FE) MG tablet Take 1 tablet (325 mg total) by mouth 3 (three) times daily with meals.  3  ? loratadine (CLARITIN) 10 MG tablet Take 1 tablet (10 mg total) by mouth daily as needed for allergies. 30 tablet 0  ? megestrol (MEGACE) 40 MG tablet Take 1 tablet (40 mg total) by mouth 2 (two) times daily. 60 tablet 1  ? Multiple Vitamins-Minerals (ADULT ONE DAILY GUMMIES) CHEW Chew 2 tablets by mouth every morning.    ? OVER THE COUNTER MEDICATION Take 1 capsule by mouth 2 (two) times daily. Beet root capsule    ? OVER THE COUNTER MEDICATION Take 2 tablets by mouth every morning. Omega XL - purchased online    ? OVER THE COUNTER MEDICATION Apply 1 application topically 3 (three) times daily as needed (knee pain).  Otc foam for knee pain    ? pantoprazole (PROTONIX) 40 MG tablet Take 1 tablet (40 mg total) by mouth daily. 30 tablet 1  ? polyethylene glycol (MIRALAX / GLYCOLAX) 17 g packet Take 17 g by mouth 2 (two) times daily. 72 each 1  ? senna-docusate (SENOKOT-S) 8.6-50 MG tablet Take 1 tablet by mouth 2 (two) times daily. 60 tablet 1  ? sodium chloride (OCEAN) 0.65 % SOLN nasal spray Place 1 spray into both nostrils as needed for congestion (nose irritation).  0  ? traMADol (ULTRAM) 50 MG tablet Take 1 tablet (50 mg total) by mouth every 6 (six) hours as needed for moderate pain. 15 tablet 0  ? amiodarone  (PACERONE) 200 MG tablet Take 1 tablet (200 mg total) by mouth daily. 90 tablet 3  ? apixaban (ELIQUIS) 5 MG TABS tablet Take 1 tablet (5 mg total) by mouth 2 (two) times daily. 90 tablet 2  ? bumetanide (BUMEX) 1 MG tablet TAKE 1 TABLET(1 MG) BY MOUTH TWICE DAILY 90 tablet 3  ? metoprolol succinate (TOPROL-XL) 25 MG 24 hr tablet Take 0.5 tablets (12.5 mg total) by mouth daily. 45 tablet 3  ? ?No current facility-administered medications for this visit.  ?  ? ?Family History  ?  ?Family History  ?Problem Relation Age of Onset  ? ALS Mother   ? ?She indicated that her mother is deceased. She indicated that her father is deceased. ? ?Social History  ?  ?Social History  ? ?Socioeconomic History  ? Marital status: Single  ?  Spouse name: Not on file  ? Number of children: Not on file  ? Years of education: Not on file  ? Highest education level: Not on file  ?Occupational History  ? Not on file  ?Tobacco Use  ? Smoking status: Former  ?  Types: Cigarettes  ? Smokeless tobacco: Never  ?Vaping Use  ? Vaping Use: Never used  ?Substance and Sexual Activity  ? Alcohol use: Never  ? Drug use: Never  ? Sexual activity: Not Currently  ?Other Topics Concern  ? Not on file  ?Social History Narrative  ? Not on file  ? ?Social Determinants of Health  ? ?Financial Resource Strain: Not on file  ?Food Insecurity: Not on file  ?Transportation Needs: Not on file  ?Physical Activity: Not on file  ?Stress: Not on file  ?Social Connections: Not on file  ?Intimate Partner Violence: Not on file  ?  ? ?Review of Systems  ?  ?General:  No chills, fever, night sweats or weight changes.  ?Cardiovascular:  No chest pain, dyspnea on exertion, edema, orthopnea, palpitations, paroxysmal nocturnal dyspnea. ?Dermatological: No rash, lesions/masses ?Respiratory: No cough, dyspnea ?Urologic: No hematuria, dysuria ?Abdominal:   No nausea, vomiting, diarrhea, no bright red blood per rectum, melena, or hematemesis, positive for vaginal  bleeding. ?Neurologic:  No visual changes, wkns, changes in mental status. ?All other systems reviewed and are otherwise negative except as noted above. ? ?  ? ?Physical Exam  ?  ?VS:  BP (!) 106/50 (BP Location: Left Arm)   Pulse (!) 58   Ht _0  (1.753 m)   Wt 300 lb (136.1 kg)   SpO2 100%   BMI 44.30 kg/m?  , BMI Body mass index is 44.3 kg/m?. ?    ?GEN: Well nourished, well developed, in no acute distress. ?HEENT: normal. ?Neck: Supple, no JVD, carotid bruits, or masses. ?Cardiac: RRR, no murmurs, rubs, or gallops. No clubbing, cyanosis, edema.  Radials/DP/PT 2+ and equal bilaterally.  ?Respiratory:  Respirations regular and unlabored, clear to auscultation bilaterally. ?GI: Soft, nontender, nondistended, BS + x 4. ?MS: no deformity or atrophy. ?Skin: warm and dry, no rash. ?Neuro:  Strength and sensation are intact. ?Psych: Normal affect. ? ?Accessory Clinical Findings  ?  ?ECG personally reviewed by me today- Sinus bradycardia left axis deviation, incomplete RBBB, rate of 58 bpm.  - No acute changes ? ?Lab Results  ?Component Value Date  ? WBC 7.1 05/05/2021  ? HGB 13.7 05/05/2021  ? HCT 45.6 05/05/2021  ? MCV 80.3 05/05/2021  ? PLT 388 05/05/2021  ? ?Lab Results  ?Component Value Date  ? CREATININE 0.82 05/06/2021  ? BUN 24 (H) 05/06/2021  ? NA 137 05/06/2021  ? K 3.9 05/06/2021  ? CL 94 (L) 05/06/2021  ? CO2 24 05/06/2021  ? ?Lab Results  ?Component Value Date  ? ALT 16 04/18/2021  ? AST 23 04/18/2021  ? ALKPHOS 77 04/18/2021  ? BILITOT 1.2 04/18/2021  ? ?No results found for: CHOL, HDL, LDLCALC, LDLDIRECT, TRIG, CHOLHDL  ?No results found for: HGBA1C ? ?Review of Prior Studies: ?TEE-DCCV 04/26/21: ? 1. Left ventricular ejection fraction, by estimation, is 45 to 50%. The  ?left ventricle has low normal function.  ? 2. Right ventricular systolic function is mildly reduced. The right  ?ventricular size is moderately enlarged.  ? 3. Left atrial size was mildly dilated. No left atrial/left atrial  ?appendage  thrombus was detected.  ? 4. Right atrial size was severely dilated.  ? 5. The mitral valve is normal in structure. Trivial mitral valve  ?regurgitation.  ? 6. 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

## 2021-08-13 ENCOUNTER — Ambulatory Visit (INDEPENDENT_AMBULATORY_CARE_PROVIDER_SITE_OTHER): Payer: Medicaid Other | Admitting: Adult Health

## 2021-08-13 ENCOUNTER — Encounter: Payer: Self-pay | Admitting: Adult Health

## 2021-08-13 VITALS — BP 106/50 | HR 58 | Ht 69.0 in | Wt 300.0 lb

## 2021-08-13 DIAGNOSIS — D500 Iron deficiency anemia secondary to blood loss (chronic): Secondary | ICD-10-CM | POA: Diagnosis not present

## 2021-08-13 DIAGNOSIS — N939 Abnormal uterine and vaginal bleeding, unspecified: Secondary | ICD-10-CM

## 2021-08-13 DIAGNOSIS — I480 Paroxysmal atrial fibrillation: Secondary | ICD-10-CM | POA: Diagnosis not present

## 2021-08-13 DIAGNOSIS — M1990 Unspecified osteoarthritis, unspecified site: Secondary | ICD-10-CM

## 2021-08-13 DIAGNOSIS — I5043 Acute on chronic combined systolic (congestive) and diastolic (congestive) heart failure: Secondary | ICD-10-CM

## 2021-08-13 LAB — COMPREHENSIVE METABOLIC PANEL
ALT: 26 IU/L (ref 0–32)
AST: 24 IU/L (ref 0–40)
Albumin/Globulin Ratio: 1.4 (ref 1.2–2.2)
Albumin: 4.3 g/dL (ref 3.8–4.9)
Alkaline Phosphatase: 98 IU/L (ref 44–121)
BUN/Creatinine Ratio: 22 (ref 9–23)
BUN: 18 mg/dL (ref 6–24)
Bilirubin Total: 0.5 mg/dL (ref 0.0–1.2)
CO2: 21 mmol/L (ref 20–29)
Calcium: 10.2 mg/dL (ref 8.7–10.2)
Chloride: 103 mmol/L (ref 96–106)
Creatinine, Ser: 0.82 mg/dL (ref 0.57–1.00)
Globulin, Total: 3.1 g/dL (ref 1.5–4.5)
Glucose: 71 mg/dL (ref 70–99)
Potassium: 4.1 mmol/L (ref 3.5–5.2)
Sodium: 142 mmol/L (ref 134–144)
Total Protein: 7.4 g/dL (ref 6.0–8.5)
eGFR: 82 mL/min/{1.73_m2} (ref >59)

## 2021-08-13 LAB — LIPID PANEL
Chol/HDL Ratio: 3.3 ratio (ref 0.0–4.4)
Cholesterol, Total: 222 mg/dL — ABNORMAL HIGH (ref 100–199)
HDL: 68 mg/dL (ref >39)
LDL Chol Calc (NIH): 142 mg/dL — ABNORMAL HIGH (ref 0–99)
Triglycerides: 67 mg/dL (ref 0–149)
VLDL Cholesterol Cal: 12 mg/dL (ref 5–40)

## 2021-08-13 LAB — CBC
Hematocrit: 39.6 % (ref 34.0–46.6)
Hemoglobin: 13.3 g/dL (ref 11.1–15.9)
MCH: 30 pg (ref 26.6–33.0)
MCHC: 33.6 g/dL (ref 31.5–35.7)
MCV: 89 fL (ref 79–97)
Platelets: 298 10*3/uL (ref 150–450)
RBC: 4.44 x10E6/uL (ref 3.77–5.28)
RDW: 13.7 % (ref 11.7–15.4)
WBC: 7.3 10*3/uL (ref 3.4–10.8)

## 2021-08-13 LAB — TSH: TSH: 3.63 u[IU]/mL (ref 0.450–4.500)

## 2021-08-13 MED ORDER — AMIODARONE HCL 200 MG PO TABS: 200.0000 mg | ORAL_TABLET | Freq: Every day | ORAL | 3 refills | Status: DC

## 2021-08-13 MED ORDER — METOPROLOL SUCCINATE ER 25 MG PO TB24: 12.5000 mg | ORAL_TABLET | Freq: Every day | ORAL | 3 refills | Status: DC

## 2021-08-13 MED ORDER — APIXABAN 5 MG PO TABS: 5.0000 mg | ORAL_TABLET | Freq: Two times a day (BID) | ORAL | 2 refills | Status: DC

## 2021-08-13 MED ORDER — BUMETANIDE 1 MG PO TABS: ORAL_TABLET | ORAL | 3 refills | Status: DC

## 2021-08-13 NOTE — Patient Instructions (Signed)
Medication Instructions:  ?Your Physician recommend you continue on your current medication as directed.   ? ?*If you need a refill on your cardiac medications before your next appointment, please call your pharmacy* ? ? ?Lab Work: ?Today (cmet, cbc, tsh, lipids) ?If you have labs (blood work) drawn today and your tests are completely normal, you will receive your results only by: ?MyChart Message (if you have MyChart) OR ?A paper copy in the mail ?If you have any lab test that is abnormal or we need to change your treatment, we will call you to review the results. ? ? ?Follow-Up: ?At [facility], you and your health needs are our priority.  As part of our continuing mission to provide you with exceptional heart care, we have created designated Provider Care Teams.  These Care Teams include your primary Cardiologist (physician) and Advanced Practice Providers (APPs -  Physician Assistants and Nurse Practitioners) who all work together to provide you with the care you need, when you need it. ? ?We recommend signing up for the patient portal called "MyChart".  Sign up information is provided on this After Visit Summary.  MyChart is used to connect with patients for Virtual Visits (Telemedicine).  Patients are able to view lab/test results, encounter notes, upcoming appointments, etc.  Non-urgent messages can be sent to your provider as well.   ?To learn more about what you can do with MyChart, go to [email or web address].   ? ?Your next appointment:   ?6 month(s) ? ?The format for your next appointment:   ?In Person ? ?Provider:   ?Jory Sims, DNP, ANP   or   Pixie Casino, MD will plan to see you again in 6 month(s). ? ? ?

## 2021-08-17 ENCOUNTER — Telehealth: Payer: Self-pay

## 2021-08-17 DIAGNOSIS — E785 Hyperlipidemia, unspecified: Secondary | ICD-10-CM

## 2021-08-17 MED ORDER — ROSUVASTATIN CALCIUM 5 MG PO TABS: 5.0000 mg | ORAL_TABLET | Freq: Every day | ORAL | 3 refills | Status: DC

## 2021-08-17 NOTE — Telephone Encounter (Signed)
-----  Message from Lendon Colonel, NP sent at 08/14/2021  8:14 AM EDT ----- ?I have reviewed her labs. Cholesterol and LDL are elevated. Begin rosuvastatin 5 mg daily, send her low cholesterol diet and walking program.  Follow up fasting lipids and LFTs in 3 months.  ? ?Curt Bears ?

## 2021-08-17 NOTE — Telephone Encounter (Addendum)
Called patient regarding results. Patient had understanding of results. Patient aware will start Crestor 5 mg Daily. And will repeat Lipid and LFT's in #months.----- Message from Lendon Colonel, NP sent at 08/14/2021  8:14 AM EDT ----- ?I have reviewed her labs. Cholesterol and LDL are elevated. Begin rosuvastatin 5 mg daily, send her low cholesterol diet and walking program.  Follow up fasting lipids and LFTs in 3 months.  ? ?Amber Herring ?

## 2021-08-20 ENCOUNTER — Telehealth: Payer: Self-pay | Admitting: Adult Health

## 2021-08-20 NOTE — Telephone Encounter (Signed)
Called patient, gave results-  ?Patient requested a letter of diet information for lowering cholesterol be mailed to her. I did mail information to her address.  ?Patient verbalized understanding. Thankful for call back. ?  ?

## 2021-08-20 NOTE — Telephone Encounter (Signed)
Pt is requesting call back in regards to meal plans she was suppose to follow per Dr.  ?

## 2021-08-25 ENCOUNTER — Other Ambulatory Visit: Payer: Self-pay | Admitting: Internal Medicine

## 2021-08-25 DIAGNOSIS — D500 Iron deficiency anemia secondary to blood loss (chronic): Secondary | ICD-10-CM

## 2021-08-25 DIAGNOSIS — I480 Paroxysmal atrial fibrillation: Secondary | ICD-10-CM

## 2021-08-25 DIAGNOSIS — I5043 Acute on chronic combined systolic (congestive) and diastolic (congestive) heart failure: Secondary | ICD-10-CM

## 2021-09-10 ENCOUNTER — Telehealth: Payer: Self-pay | Admitting: Internal Medicine

## 2021-09-10 NOTE — Telephone Encounter (Signed)
Returned call to patient who states that she started Crestor 73m over a month ago. Patient reports she started having bad muscle aches, and joint pain. Patient states that she stopped the crestor around 3 days ago and reports that she feels much better and her muscle aches/joint pains are much better.   Patient would like to know if there is anything else she can try. This is the first statin that she has taken.   Advised patient I would forward message to Dr. HDebara Pickettfor him to review and advise. Patient verbalized understanding.

## 2021-09-10 NOTE — Telephone Encounter (Signed)
Pt c/o medication issue:  1. Name of Medication:  rosuvastatin (CRESTOR) 5 MG tablet  2. How are you currently taking this medication (dosage and times per day)? Stop taking 2 days ago   3. Are you having a reaction (difficulty breathing--STAT)? Yes  4. What is your medication issue? Pt called stating that this medicine is making her bones ache and joints hurt and makes her feel like she can't move.  Would like a different medication.

## 2021-09-10 NOTE — Telephone Encounter (Signed)
Would advise she stay off the rosuvastatin for 2 weeks - if symptoms totally resolved, then can try another statin at that time - ok to Rx for pravastatin 40 mg daily.  Thanks.  Dr. Lemmie Evens

## 2021-09-10 NOTE — Telephone Encounter (Signed)
Returned call to patient and made her aware of Dr. Lysbeth Penner recommendations. Patient will stay off Rosuvastatin for 2 weeks and will call in with update. If feeling better she would like to go ahead and switch to pravastatin.   Will forward to Dr. Lysbeth Penner nurse to make aware.

## 2021-10-29 ENCOUNTER — Other Ambulatory Visit: Payer: Self-pay | Admitting: Physician Assistant

## 2021-10-29 DIAGNOSIS — Z1231 Encounter for screening mammogram for malignant neoplasm of breast: Secondary | ICD-10-CM

## 2021-11-19 ENCOUNTER — Ambulatory Visit: Payer: Medicaid Other | Admitting: Surgical

## 2021-12-03 ENCOUNTER — Ambulatory Visit: Payer: Medicaid Other | Admitting: Surgical

## 2021-12-17 ENCOUNTER — Telehealth: Payer: Self-pay

## 2021-12-17 ENCOUNTER — Encounter: Payer: Self-pay | Admitting: Orthopedic Surgery

## 2021-12-17 ENCOUNTER — Ambulatory Visit (INDEPENDENT_AMBULATORY_CARE_PROVIDER_SITE_OTHER): Payer: Medicaid Other | Admitting: Orthopedic Surgery

## 2021-12-17 DIAGNOSIS — M170 Bilateral primary osteoarthritis of knee: Secondary | ICD-10-CM

## 2021-12-17 NOTE — Telephone Encounter (Signed)
FYI- We no longer have any programs that will cover gel injections for Medicaid patients.  I did advise patient about TriVisc, but patient declined at this time.  Patient may want to try cortisone injections at her next visit.

## 2021-12-17 NOTE — Telephone Encounter (Signed)
Needs auth for bil knee gel injections

## 2021-12-17 NOTE — Progress Notes (Signed)
Office Visit Note   Patient: Amber Herring           Date of Birth: 1960/08/21           MRN: [id number] Visit Date: 12/17/2021 Requested by: Jorge Ny, PA-C [id number] [place],  [place] [id number] PCP: Jorge Ny, PA-C  Subjective: Chief Complaint  Patient presents with   Right Knee - Pain   Left Knee - Pain    HPI: Patient presents for evaluation of bilateral knee pain right equal to left.  Reports chronic pain but has been severe for more than a year.  Has difficulty with ambulating and weightbearing.  Really unable to stand for long period of time.  Reports weakness and giving way.  Describes locking and stiffness.  Does have history of CHF and takes Eliquis for atrial fibrillation.  Was taking ibuprofen but she cannot really take that anymore with Eliquis.  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  Currently not working.  Lives with her daughter.  No prior injections in either knee.  Has a history of right knee arthroscopy 30 years ago.  She has 5-10 300 pounds for BMI of 43.  Radiographs on the hospital system are reviewed and they show severe end-stage tricompartmental arthritis with significant erosion in the joint surfaces due to the arthritis.              ROS: All systems reviewed are negative as they relate to the chief complaint within the history of present illness.  Patient denies  fevers or chills.   Assessment & Plan: Visit Diagnoses:  1. Bilateral primary osteoarthritis of knee   2. Primary osteoarthritis of both knees     Plan: Impression is bilateral knee arthritis with left knee clinically more symptomatic than the right knee.  She does have some edema in the lower leg region.  She has palpable pedal pulses and normal rate.  Plan at this time is refer to weight loss center for preop for total knee replacement.  We will try some gel shots just to see if that can help her to some degree.  Follow-up in about 3 weeks for the gel shots.  We  talked a lot about total knee replacement and the risk and benefits in her case.  I would put her in the moderate risk category based on her multiple medical comorbidities and current BMI.  I think weight loss would make her a better candidate for knee replacement.  Her knee arthritis however is quite severe and I do think she has total knee replacement in her future.  Follow-Up Instructions: No follow-ups on file.   Orders:  Orders Placed This Encounter  Procedures   Amb Ref to Medical Weight Management   No orders of the defined types were placed in this encounter.     Procedures: No procedures performed   Clinical Data: No additional findings.  Objective: Vital Signs: There were no vitals taken for this visit.  Physical Exam:   Constitutional: Patient appears well-developed HEENT:  Head: Normocephalic Eyes:EOM are normal Neck: Normal range of motion Cardiovascular: Normal rate Pulmonary/chest: Effort normal Neurologic: Patient is alert Skin: Skin is warm Psychiatric: Patient has normal mood and affect   Ortho Exam: Ortho exam demonstrates range of motion of the left knee 10-90.  Does have some pain but no effusion in the left knee with intact extensor mechanism.  Some edema is present in bilateral lower extremities.  Pedal  pulses intact bilateral feet.  Right knee has a little bit easier range of motion from 0-90.  No groin pain on the right left-hand side with internal/external rotation of the hips.  Specialty Comments:  No specialty comments available.  Imaging: No results found.   PMFS History: Patient Active Problem List   Diagnosis Date Noted   Postmenopausal vaginal bleeding    Constipation    Acute on chronic combined systolic and diastolic CHF (congestive heart failure) ([place])    Atrial flutter ([place]) 04/17/2021   CAP (community acquired pneumonia) 04/17/2021   Pressure injury of skin 04/17/2021   Anasarca 04/16/2021   Cardiomegaly 04/16/2021   Chest  pain 04/16/2021   Symptomatic anemia 01/25/2021   Morbid obesity ([place]) 01/25/2021   Vaginal bleeding 01/25/2021   Iron deficiency anemia due to chronic blood loss 01/25/2021   Hypokalemia 01/25/2021   Facial swelling 11/17/2006   Headache(784.0) 05/19/2005   Stress at work 11/17/2003   Urinary tract infection with hematuria 10/16/1997   Knee pain 06/16/1996   Sinusitis 02/17/1996   Back pain 10/16/1992   Past Medical History:  Diagnosis Date   Allergy    Anemia    iron defienency   Anxiety    GERD (gastroesophageal reflux disease)    Hyperlipidemia    Impaired fasting glucose    Metabolic syndrome    Obesity     Family History  Problem Relation Age of Onset   ALS Mother     Past Surgical History:  Procedure Laterality Date   CARDIOVERSION N/A 04/26/2021   Procedure: CARDIOVERSION;  Surgeon: Freada Bergeron, MD;  Location: [facility] ENDOSCOPY;  Service: Cardiovascular;  Laterality: N/A;   COLONOSCOPY  04-2004   KNEE ARTHROSCOPY     TEE WITHOUT CARDIOVERSION N/A 04/26/2021   Procedure: TRANSESOPHAGEAL ECHOCARDIOGRAM (TEE);  Surgeon: Freada Bergeron, MD;  Location: [facility] ENDOSCOPY;  Service: Cardiovascular;  Laterality: N/A;   Social History   Occupational History   Not on file  Tobacco Use   Smoking status: Former    Types: Cigarettes   Smokeless tobacco: Never  Vaping Use   Vaping Use: Never used  Substance and Sexual Activity   Alcohol use: Never   Drug use: Never   Sexual activity: Not Currently

## 2022-01-12 ENCOUNTER — Ambulatory Visit (INDEPENDENT_AMBULATORY_CARE_PROVIDER_SITE_OTHER): Payer: Medicaid Other | Admitting: Orthopedic Surgery

## 2022-01-12 DIAGNOSIS — M170 Bilateral primary osteoarthritis of knee: Secondary | ICD-10-CM | POA: Diagnosis not present

## 2022-01-12 DIAGNOSIS — M1712 Unilateral primary osteoarthritis, left knee: Secondary | ICD-10-CM

## 2022-01-13 ENCOUNTER — Encounter: Payer: Self-pay | Admitting: Orthopedic Surgery

## 2022-01-13 MED ORDER — LIDOCAINE HCL 1 % IJ SOLN
5.0000 mL | INTRAMUSCULAR | Status: AC | PRN
  Administered 2022-01-12: 5 mL

## 2022-01-13 MED ORDER — METHYLPREDNISOLONE ACETATE 40 MG/ML IJ SUSP
40.0000 mg | INTRAMUSCULAR | Status: AC | PRN
  Administered 2022-01-12: 40 mg via INTRA_ARTICULAR

## 2022-01-13 MED ORDER — BUPIVACAINE HCL 0.25 % IJ SOLN
4.0000 mL | INTRAMUSCULAR | Status: AC | PRN
  Administered 2022-01-12: 4 mL via INTRA_ARTICULAR

## 2022-01-13 NOTE — Progress Notes (Signed)
Office Visit Note   Patient: Amber Herring           Date of Birth: 06/09/1960           MRN: [id number] Visit Date: 01/12/2022 Requested by: Jorge Ny, PA-C [address] [place],  [place] [address] PCP: Jorge Ny, PA-C  Subjective: Chief Complaint  Patient presents with   Right Knee - Pain   Left Knee - Pain    HPI: Amber Herring is a 61 y.o. female who presents to the office reporting bilateral knee pain.  She has a history of arthritis.  Insurance does not cover gel injections for this patient.  She wants to discuss other options.  BMI exceeds acceptable limit for knee replacement.  Has significant pain with weightbearing as well as at rest..                ROS: All systems reviewed are negative as they relate to the chief complaint within the history of present illness.  Patient denies fevers or chills.  Assessment & Plan: Visit Diagnoses:  1. Bilateral primary osteoarthritis of knee     Plan: Impression is bilateral knee pain with flexion contracture on the left and less of a flexion contracture on the right.  Weight loss referral made.  Left knee injected.  Continue with nonweightbearing quad strengthening exercises and diet modifications so that she can get closer to an ideal body weight for knee replacement.  Follow-up as needed  Follow-Up Instructions: No follow-ups on file.   Orders:  No orders of the defined types were placed in this encounter.  No orders of the defined types were placed in this encounter.     Procedures: Large Joint Inj: L knee on 01/12/2022 5:58 PM Indications: diagnostic evaluation, joint swelling and pain Details: 18 G 1.5 in needle, superolateral approach  Arthrogram: No  Medications: 5 mL lidocaine 1 %; 40 mg methylPREDNISolone acetate 40 MG/ML; 4 mL bupivacaine 0.25 % Outcome: tolerated well, no immediate complications Procedure, treatment alternatives, risks and benefits explained, specific risks discussed. Consent  was given by the patient. Immediately prior to procedure a time out was called to verify the correct patient, procedure, equipment, support staff and site/side marked as required. Patient was prepped and draped in the usual sterile fashion.       Clinical Data: No additional findings.  Objective: Vital Signs: There were no vitals taken for this visit.  Physical Exam:  Constitutional: Patient appears well-developed HEENT:  Head: Normocephalic Eyes:EOM are normal Neck: Normal range of motion Cardiovascular: Normal rate Pulmonary/chest: Effort normal Neurologic: Patient is alert Skin: Skin is warm Psychiatric: Patient has normal mood and affect  Ortho Exam: Ortho exam demonstrates left knee range of motion 30-1 05 right knee range of motion 15-1 15.  Pedal pulses palpable.  No groin pain on the right or left-hand side with internal or external rotation of the leg.  Ankle dorsiflexion intact.  No effusion is present in either knee  Specialty Comments:  No specialty comments available.  Imaging: No results found.   PMFS History: Patient Active Problem List   Diagnosis Date Noted   Postmenopausal vaginal bleeding    Constipation    Acute on chronic combined systolic and diastolic CHF (congestive heart failure) ([place])    Atrial flutter ([place]) 04/17/2021   CAP (community acquired pneumonia) 04/17/2021   Pressure injury of skin 04/17/2021   Anasarca 04/16/2021   Cardiomegaly 04/16/2021   Chest pain 04/16/2021   Symptomatic  anemia 01/25/2021   Morbid obesity ([place]) 01/25/2021   Vaginal bleeding 01/25/2021   Iron deficiency anemia due to chronic blood loss 01/25/2021   Hypokalemia 01/25/2021   Facial swelling 11/17/2006   Headache(784.0) 05/19/2005   Stress at work 11/17/2003   Urinary tract infection with hematuria 10/16/1997   Knee pain 06/16/1996   Sinusitis 02/17/1996   Back pain 10/16/1992   Past Medical History:  Diagnosis Date   Allergy    Anemia    iron  defienency   Anxiety    GERD (gastroesophageal reflux disease)    Hyperlipidemia    Impaired fasting glucose    Metabolic syndrome    Obesity     Family History  Problem Relation Age of Onset   ALS Mother     Past Surgical History:  Procedure Laterality Date   CARDIOVERSION N/A 04/26/2021   Procedure: CARDIOVERSION;  Surgeon: Freada Bergeron, MD;  Location: [facility] ENDOSCOPY;  Service: Cardiovascular;  Laterality: N/A;   COLONOSCOPY  04-2004   KNEE ARTHROSCOPY     TEE WITHOUT CARDIOVERSION N/A 04/26/2021   Procedure: TRANSESOPHAGEAL ECHOCARDIOGRAM (TEE);  Surgeon: Freada Bergeron, MD;  Location: [facility] ENDOSCOPY;  Service: Cardiovascular;  Laterality: N/A;   Social History   Occupational History   Not on file  Tobacco Use   Smoking status: Former    Types: Cigarettes   Smokeless tobacco: Never  Vaping Use   Vaping Use: Never used  Substance and Sexual Activity   Alcohol use: Never   Drug use: Never   Sexual activity: Not Currently

## 2022-01-17 ENCOUNTER — Other Ambulatory Visit: Payer: Self-pay | Admitting: Adult Health

## 2022-01-17 DIAGNOSIS — I5043 Acute on chronic combined systolic (congestive) and diastolic (congestive) heart failure: Secondary | ICD-10-CM

## 2022-01-17 DIAGNOSIS — I480 Paroxysmal atrial fibrillation: Secondary | ICD-10-CM

## 2022-01-17 DIAGNOSIS — D500 Iron deficiency anemia secondary to blood loss (chronic): Secondary | ICD-10-CM

## 2022-01-17 NOTE — Telephone Encounter (Signed)
Prescription refill request for Eliquis received. Indication: PAF Last office visit: 08/13/21  Arnold Long NP Scr: 0.69 on 10/29/21 Age: 61 Weight: 136.1kg  Based on above findings Eliquis 44m twice daily is the appropriate dose.  Refill approved.

## 2022-01-31 ENCOUNTER — Ambulatory Visit (INDEPENDENT_AMBULATORY_CARE_PROVIDER_SITE_OTHER): Payer: Medicaid Other | Admitting: Surgical

## 2022-01-31 DIAGNOSIS — M1711 Unilateral primary osteoarthritis, right knee: Secondary | ICD-10-CM | POA: Diagnosis not present

## 2022-01-31 DIAGNOSIS — M170 Bilateral primary osteoarthritis of knee: Secondary | ICD-10-CM

## 2022-02-05 ENCOUNTER — Encounter: Payer: Self-pay | Admitting: Orthopedic Surgery

## 2022-02-05 MED ORDER — METHYLPREDNISOLONE ACETATE 40 MG/ML IJ SUSP
40.0000 mg | INTRAMUSCULAR | Status: AC | PRN
  Administered 2022-01-31: 40 mg via INTRA_ARTICULAR

## 2022-02-05 MED ORDER — LIDOCAINE HCL 1 % IJ SOLN
5.0000 mL | INTRAMUSCULAR | Status: AC | PRN
  Administered 2022-01-31: 5 mL

## 2022-02-05 MED ORDER — BUPIVACAINE HCL 0.25 % IJ SOLN
4.0000 mL | INTRAMUSCULAR | Status: AC | PRN
  Administered 2022-01-31: 4 mL via INTRA_ARTICULAR

## 2022-02-05 NOTE — Progress Notes (Signed)
Procedure Note  Patient: Amber Herring             Date of Birth: 07-Nov-1960           MRN: [id number]             Visit Date: 01/31/2022  Procedures: Visit Diagnoses:  1. Bilateral primary osteoarthritis of knee   2. Primary osteoarthritis of both knees     Large Joint Inj: R knee on 01/31/2022 10:55 AM Indications: diagnostic evaluation, joint swelling and pain Details: 18 G 1.5 in needle, superolateral approach  Arthrogram: No  Medications: 5 mL lidocaine 1 %; 40 mg methylPREDNISolone acetate 40 MG/ML; 4 mL bupivacaine 0.25 % Outcome: tolerated well, no immediate complications  Patient returns for planned injection of the right knee.  She has severe right knee osteoarthritis.  The contact number for healthy weight and wellness was given to patient again today.  She also had left knee injection at her last appointment that provided decent relief for about 1 to 2 weeks. Procedure, treatment alternatives, risks and benefits explained, specific risks discussed. Consent was given by the patient. Immediately prior to procedure a time out was called to verify the correct patient, procedure, equipment, support staff and site/side marked as required. Patient was prepped and draped in the usual sterile fashion.

## 2022-02-11 ENCOUNTER — Ambulatory Visit: Payer: Medicaid Other | Attending: Adult Health | Admitting: Adult Health

## 2022-02-11 ENCOUNTER — Encounter: Payer: Self-pay | Admitting: Adult Health

## 2022-02-11 VITALS — BP 110/70 | HR 56 | Ht 69.0 in | Wt 304.0 lb

## 2022-02-11 DIAGNOSIS — E7800 Pure hypercholesterolemia, unspecified: Secondary | ICD-10-CM

## 2022-02-11 DIAGNOSIS — D500 Iron deficiency anemia secondary to blood loss (chronic): Secondary | ICD-10-CM

## 2022-02-11 DIAGNOSIS — I10 Essential (primary) hypertension: Secondary | ICD-10-CM

## 2022-02-11 DIAGNOSIS — I480 Paroxysmal atrial fibrillation: Secondary | ICD-10-CM

## 2022-02-11 DIAGNOSIS — I5043 Acute on chronic combined systolic (congestive) and diastolic (congestive) heart failure: Secondary | ICD-10-CM

## 2022-02-11 MED ORDER — BUMETANIDE 1 MG PO TABS: ORAL_TABLET | ORAL | 3 refills | Status: DC

## 2022-02-11 MED ORDER — METOPROLOL SUCCINATE ER 25 MG PO TB24: 12.5000 mg | ORAL_TABLET | Freq: Every day | ORAL | 3 refills | Status: DC

## 2022-02-11 MED ORDER — DICLOFENAC SODIUM 1 % EX GEL: 2.0000 g | Freq: Four times a day (QID) | CUTANEOUS | Status: DC

## 2022-02-11 MED ORDER — PRAVASTATIN SODIUM 20 MG PO TABS: 20.0000 mg | ORAL_TABLET | Freq: Every evening | ORAL | 3 refills | Status: DC

## 2022-02-11 MED ORDER — AMIODARONE HCL 200 MG PO TABS: 200.0000 mg | ORAL_TABLET | Freq: Every day | ORAL | 3 refills | Status: DC

## 2022-02-11 NOTE — Patient Instructions (Signed)
Medication Instructions:  Stop Crestor. Start Pravastatin 20 mg ( Take 1 Tablet Daily). *If you need a refill on your cardiac medications before your next appointment, please call your pharmacy*   Lab Work: BMET, TSH Today. If you have labs (blood work) drawn today and your tests are completely normal, you will receive your results only by: [place] (if you have MyChart) OR A paper copy in the mail If you have any lab test that is abnormal or we need to change your treatment, we will call you to review the results.   Testing/Procedures: No Testing   Follow-Up: At [facility], you and your health needs are our priority.  As part of our continuing mission to provide you with exceptional heart care, we have created designated Provider Care Teams.  These Care Teams include your primary Cardiologist (physician) and Advanced Practice Providers (APPs -  Physician Assistants and Nurse Practitioners) who all work together to provide you with the care you need, when you need it.  We recommend signing up for the patient portal called "MyChart".  Sign up information is provided on this After Visit Summary.  MyChart is used to connect with patients for Virtual Visits (Telemedicine).  Patients are able to view lab/test results, encounter notes, upcoming appointments, etc.  Non-urgent messages can be sent to your provider as well.   To learn more about what you can do with MyChart, go to [email or web address].    Your next appointment:   6 week(s)  The format for your next appointment:   In Person  Provider:   Jory Sims, DNP, ANP

## 2022-02-11 NOTE — Progress Notes (Signed)
Cardiology Clinic Note   Patient Name: Amber Herring Date of Encounter: 02/11/2022  Primary Care Provider:  Jorge Ny, PA-C Primary Cardiologist:  Pixie Casino, MD  Patient Profile    61 y.o. female with history of obesity, depression and anemia presented with generalized swelling and cardiomegaly concerning for heart failure as well as atrial flutter CHADS VASC Score 2, with admission in January 2023 for acute on chronic systolic CHF and CAP. She is now s/p TEE-guided cardioversion maintaining sinus rhythm on amiodarone. Mildly reduced EF of 45%-50% with severely dilated right atrium, chronic venous stasis.  Past Medical History    Past Medical History:  Diagnosis Date   Allergy    Anemia    iron defienency   Anxiety    GERD (gastroesophageal reflux disease)    Hyperlipidemia    Impaired fasting glucose    Metabolic syndrome    Obesity    Past Surgical History:  Procedure Laterality Date   CARDIOVERSION N/A 04/26/2021   Procedure: CARDIOVERSION;  Surgeon: Freada Bergeron, MD;  Location: [facility] ENDOSCOPY;  Service: Cardiovascular;  Laterality: N/A;   COLONOSCOPY  04-2004   KNEE ARTHROSCOPY     TEE WITHOUT CARDIOVERSION N/A 04/26/2021   Procedure: TRANSESOPHAGEAL ECHOCARDIOGRAM (TEE);  Surgeon: Freada Bergeron, MD;  Location: [facility] ENDOSCOPY;  Service: Cardiovascular;  Laterality: N/A;    Allergies  No Known Allergies  History of Present Illness   [name] comes today with her daughter.  Her main complaint is concerning rosuvastatin as this is caused significant myalgias and she had to stop.  She denies any chest discomfort, palpitations, racing heart rate, or edema.  Other than her statin therapy, she has been compliant with her medication regimen.  She does talk about being really tired all the time, not being able to exercise because of her weight.  I did do the STOP BANG questionnaire for sleep apnea.  And her score was 7.  She is interested in losing weight  so that she can have her knees repaired and she states that they have been causing her a lot of pain.  She did have cortisone injections to both knees this last week.  Home Medications    Current Outpatient Medications  Medication Sig Dispense Refill   acetaminophen (TYLENOL) 325 MG tablet Take 2 tablets (650 mg total) by mouth every 6 (six) hours as needed for mild pain (or Fever >/= 101).     apixaban (ELIQUIS) 5 MG TABS tablet TAKE 1 TABLET(5 MG) BY MOUTH TWICE DAILY 180 tablet 1   APPLE CIDER VINEGAR PO Take 1 tablet by mouth every morning. gummies     capsaicin (ZOSTRIX) 0.025 % cream Apply topically 2 (two) times daily. 60 g 0   EPINEPHrine (PRIMATENE MIST) 0.125 MG/ACT AERO Inhale 1 puff into the lungs daily as needed (wheezing).     ferrous sulfate 325 (65 FE) MG tablet Take 1 tablet (325 mg total) by mouth 3 (three) times daily with meals.  3   loratadine (CLARITIN) 10 MG tablet Take 1 tablet (10 mg total) by mouth daily as needed for allergies. 30 tablet 0   megestrol (MEGACE) 40 MG tablet Take 1 tablet (40 mg total) by mouth 2 (two) times daily. 60 tablet 1   Multiple Vitamins-Minerals (ADULT ONE DAILY GUMMIES) CHEW Chew 2 tablets by mouth every morning.     OVER THE COUNTER MEDICATION Take 1 capsule by mouth 2 (two) times daily. Beet root capsule     OVER  THE COUNTER MEDICATION Take 2 tablets by mouth every morning. Omega XL - purchased online     OVER THE COUNTER MEDICATION Apply 1 application topically 3 (three) times daily as needed (knee pain). Otc foam for knee pain     pantoprazole (PROTONIX) 40 MG tablet Take 1 tablet (40 mg total) by mouth daily. 30 tablet 1   polyethylene glycol (MIRALAX / GLYCOLAX) 17 g packet Take 17 g by mouth 2 (two) times daily. 72 each 1   pravastatin (PRAVACHOL) 20 MG tablet Take 1 tablet (20 mg total) by mouth every evening. 90 tablet 3   senna-docusate (SENOKOT-S) 8.6-50 MG tablet Take 1 tablet by mouth 2 (two) times daily. 60 tablet 1   sodium  chloride (OCEAN) 0.65 % SOLN nasal spray Place 1 spray into both nostrils as needed for congestion (nose irritation).  0   traMADol (ULTRAM) 50 MG tablet Take 1 tablet (50 mg total) by mouth every 6 (six) hours as needed for moderate pain. 15 tablet 0   amiodarone (PACERONE) 200 MG tablet Take 1 tablet (200 mg total) by mouth daily. 90 tablet 3   bumetanide (BUMEX) 1 MG tablet TAKE 1 TABLET(1 MG) BY MOUTH TWICE DAILY 90 tablet 3   diclofenac Sodium (VOLTAREN) 1 % GEL Apply 2 g topically 4 (four) times daily.     metoprolol succinate (TOPROL-XL) 25 MG 24 hr tablet Take 0.5 tablets (12.5 mg total) by mouth daily. 45 tablet 3   No current facility-administered medications for this visit.     Family History    Family History  Problem Relation Age of Onset   ALS Mother    She indicated that her mother is deceased. She indicated that her father is deceased.  Social History    Social History   Socioeconomic History   Marital status: Single    Spouse name: Not on file   Number of children: Not on file   Years of education: Not on file   Highest education level: Not on file  Occupational History   Not on file  Tobacco Use   Smoking status: Former    Types: Cigarettes   Smokeless tobacco: Never  Vaping Use   Vaping Use: Never used  Substance and Sexual Activity   Alcohol use: Never   Drug use: Never   Sexual activity: Not Currently  Other Topics Concern   Not on file  Social History Narrative   Not on file   Social Determinants of Health   Financial Resource Strain: Not on file  Food Insecurity: Not on file  Transportation Needs: Not on file  Physical Activity: Not on file  Stress: Not on file  Social Connections: Not on file  Intimate Partner Violence: Not on file     Review of Systems    General:  No chills, fever, night sweats or weight changes.  Cardiovascular:  No chest pain, dyspnea on exertion, edema, orthopnea, palpitations, paroxysmal nocturnal  dyspnea. Dermatological: No rash, lesions/masses Respiratory: No cough, dyspnea Urologic: No hematuria, dysuria Abdominal:   No nausea, vomiting, diarrhea, bright red blood per rectum, melena, or hematemesis Neurologic:  No visual changes, wkns, changes in mental status. All other systems reviewed and are otherwise negative except as noted above.     Physical Exam    VS:  BP 110/70 (BP Location: Left Arm, Patient Position: Sitting, Cuff Size: Large)   Pulse (!) 56   Ht _0  (1.753 m)   Wt (!) 304 lb (137.9 kg)  SpO2 95%   BMI 44.89 kg/m  , BMI Body mass index is 44.89 kg/m. STOP-Bang Score:  7      GEN: Well nourished, well developed, in no acute distress.  Morbidly obese.  HEENT: normal. Neck: Supple, large, no JVD, carotid bruits, or masses. Cardiac: Irregular RRR, no murmurs, rubs, or gallops. No clubbing, cyanosis, mild dependent edema.  Radials/DP/PT 2+ and equal bilaterally.  Respiratory:  Respirations regular and unlabored, clear to auscultation bilaterally. GI: Soft, nontender, nondistended, BS + x 4. MS: no deformity or atrophy. Skin: warm and dry, no rash. Neuro:  Strength and sensation are intact. Psych: Normal affect.  Accessory Clinical Findings    ECG personally reviewed by me today-not completed this office visit  Lab Results  Component Value Date   WBC 7.3 08/13/2021   HGB 13.3 08/13/2021   HCT 39.6 08/13/2021   MCV 89 08/13/2021   PLT 298 08/13/2021   Lab Results  Component Value Date   CREATININE 0.82 08/13/2021   BUN 18 08/13/2021   NA 142 08/13/2021   K 4.1 08/13/2021   CL 103 08/13/2021   CO2 21 08/13/2021   Lab Results  Component Value Date   ALT 26 08/13/2021   AST 24 08/13/2021   ALKPHOS 98 08/13/2021   BILITOT 0.5 08/13/2021   Lab Results  Component Value Date   CHOL 222 (H) 08/13/2021   HDL 68 08/13/2021   LDLCALC 142 (H) 08/13/2021   TRIG 67 08/13/2021   CHOLHDL 3.3 08/13/2021    No results found for: "HGBA1C"  Review  of Prior Studies: TEE-DCCV 04/26/21:  1. Left ventricular ejection fraction, by estimation, is 45 to 50%. The  left ventricle has low normal function.   2. Right ventricular systolic function is mildly reduced. The right  ventricular size is moderately enlarged.   3. Left atrial size was mildly dilated. No left atrial/left atrial  appendage thrombus was detected.   4. Right atrial size was severely dilated.   5. The mitral valve is normal in structure. Trivial mitral valve  regurgitation.   6. There is severe tricuspid regurgitation likely due to annular dilation  in the setting of severe RA enlargement.   7. The aortic valve is tricuspid. Aortic valve regurgitation is trivial.   8. TEE was aborted early due to patient hypoxia during examination.   9. Following TEE and improvement of hypoxia, patient was successfully  cardioverted back to sinus bradycardia.    Echocardiogram 04/17/2021  1. Left ventricular ejection fraction, by estimation, is 45 to 50%. Left  ventricular ejection fraction by 2D MOD biplane is 46.2 %. The left  ventricle has mildly decreased function. The left ventricle demonstrates  global hypokinesis. Indeterminate  diastolic filling due to E-A fusion.   2. Right ventricular systolic function is normal. The right ventricular  size is moderately enlarged. There is mildly elevated pulmonary artery  systolic pressure. The estimated right ventricular systolic pressure is  46.2 mmHg.   3. Right atrial size was severely dilated.   4. The mitral valve is abnormal. Mild mitral valve regurgitation.   5. Tricuspid valve regurgitation is mild to moderate.   6. The aortic valve is tricuspid. Aortic valve regurgitation is not  visualized.   7. The inferior vena cava is dilated in size with <50% respiratory  variability, suggesting right atrial pressure of 15 mmHg.   Assessment & Plan   1.  Permanent atrial fibrillation: Remains on Eliquis, no complaints of bleeding dyscrasia  symptoms.  Remains on amiodarone 200 mg daily, metoprolol 12.5 mg daily.  I will check a TSH and a BMET today.  2.  Hypertension: Blood pressures currently controlled on medication regimen.  Accuracy of blood pressure may be in question due to heart rate irregularity.  I will keep her on metoprolol amiodarone and bumetanide as directed.  I have counseled her on a low-sodium diet.  checking BMET.  3.  High STOP BANG Score: Score of 7, she snores very loudly, stops breathing, has daytime fatigue, is obese with large neck, is older than age 14, and has hypertension.  I will schedule a home sleep study for further evaluation.  She is interested in wearing CPAP if this is necessary to help her to breathe better and feel more energetic in the morning which may also help [place] concerning her atrial fibrillation.  4.  Hypercholesterolemia: Intolerant of rosuvastatin.  This will be discontinued and I will start her on pravastatin 20 mg daily.  May need to consider PCSK9 inhibition therapy should she fail second statin regimen.  5.  Morbid obesity: Patient is interested in [place] or other SGLT inhibition medication injections.  We will discuss this further on follow-up unless she wishes to discuss this with her primary care provider.  It is my understanding there is a shortage of some of these medications.  May refer to pharmacist for further evaluation and POC.    Current medicines are reviewed at length with the patient today.  I have spent 35 min's  dedicated to the care of this patient on the date of this encounter to include pre-visit review of records, assessment, management and diagnostic testing,with shared decision making. Signed, Phill Myron. [place] Pugh, ANP, [place]   02/11/2022 4:39 PM      Office [phone] Fax [phone]  Notice: This dictation was prepared with Dragon dictation along with smaller phrase technology. Any transcriptional errors that result from this process are unintentional  and may not be corrected upon review.

## 2022-02-12 LAB — BASIC METABOLIC PANEL
BUN/Creatinine Ratio: 19 (ref 9–23)
BUN: 15 mg/dL (ref 6–24)
CO2: 21 mmol/L (ref 20–29)
Calcium: 9.7 mg/dL (ref 8.7–10.2)
Chloride: 99 mmol/L (ref 96–106)
Creatinine, Ser: 0.79 mg/dL (ref 0.57–1.00)
Glucose: 79 mg/dL (ref 70–99)
Potassium: 4.1 mmol/L (ref 3.5–5.2)
Sodium: 141 mmol/L (ref 134–144)
eGFR: 86 mL/min/{1.73_m2} (ref >59)

## 2022-02-12 LAB — TSH: TSH: 2.68 u[IU]/mL (ref 0.450–4.500)

## 2022-02-14 ENCOUNTER — Encounter (INDEPENDENT_AMBULATORY_CARE_PROVIDER_SITE_OTHER): Payer: Self-pay

## 2022-02-16 ENCOUNTER — Telehealth: Payer: Self-pay | Admitting: Internal Medicine

## 2022-02-16 DIAGNOSIS — I5043 Acute on chronic combined systolic (congestive) and diastolic (congestive) heart failure: Secondary | ICD-10-CM

## 2022-02-16 DIAGNOSIS — D500 Iron deficiency anemia secondary to blood loss (chronic): Secondary | ICD-10-CM

## 2022-02-16 DIAGNOSIS — I480 Paroxysmal atrial fibrillation: Secondary | ICD-10-CM

## 2022-02-16 NOTE — Telephone Encounter (Signed)
*  STAT* If patient is at the pharmacy, call can be transferred to refill team.   1. Which medications need to be refilled? (please list name of each medication and dose if known)   amiodarone (PACERONE) 200 MG tablet  metoprolol succinate (TOPROL-XL) 25 MG 24 hr tablet pravastatin (PRAVACHOL) 20 MG tablet  2. Which pharmacy/location (including street and city if local pharmacy) is medication to be sent to? [facility], [place]  3. Do they need a 30 day or 90 day supply? 90   She states the pharmacy told her they never received it on 02/11/22.

## 2022-02-17 ENCOUNTER — Telehealth: Payer: Self-pay

## 2022-02-17 MED ORDER — METOPROLOL SUCCINATE ER 25 MG PO TB24: 12.5000 mg | ORAL_TABLET | Freq: Every day | ORAL | 3 refills | Status: DC

## 2022-02-17 MED ORDER — PRAVASTATIN SODIUM 20 MG PO TABS: 20.0000 mg | ORAL_TABLET | Freq: Every evening | ORAL | 3 refills | Status: DC

## 2022-02-17 MED ORDER — AMIODARONE HCL 200 MG PO TABS: 200.0000 mg | ORAL_TABLET | Freq: Every day | ORAL | 3 refills | Status: DC

## 2022-02-17 NOTE — Telephone Encounter (Addendum)
Called patient regarding results. Patient had understanding of results.----- Message from Lendon Colonel, NP sent at 02/12/2022  8:49 PM EDT ----- All labs are normal. Continue all medications.   KL

## 2022-03-04 ENCOUNTER — Emergency Department (HOSPITAL_COMMUNITY)
Admission: EM | Admit: 2022-03-04 | Discharge: 2022-03-04 | Disposition: A | Discharge status: Home / Self Care | Payer: Medicaid Other | Attending: Emergency Medicine | Admitting: Emergency Medicine

## 2022-03-04 ENCOUNTER — Encounter (HOSPITAL_COMMUNITY): Payer: Self-pay

## 2022-03-04 ENCOUNTER — Ambulatory Visit
Admission: EM | Admit: 2022-03-04 | Discharge: 2022-03-04 | Disposition: A | Discharge status: Home / Self Care | Payer: Medicaid Other

## 2022-03-04 ENCOUNTER — Other Ambulatory Visit: Payer: Self-pay

## 2022-03-04 DIAGNOSIS — N611 Abscess of the breast and nipple: Secondary | ICD-10-CM | POA: Diagnosis not present

## 2022-03-04 DIAGNOSIS — Z7901 Long term (current) use of anticoagulants: Secondary | ICD-10-CM | POA: Insufficient documentation

## 2022-03-04 HISTORY — DX: Heart failure, unspecified: I50.9

## 2022-03-04 HISTORY — DX: Unspecified osteoarthritis, unspecified site: M19.90

## 2022-03-04 MED ORDER — SULFAMETHOXAZOLE-TRIMETHOPRIM 800-160 MG PO TABS: 1.0000 | ORAL_TABLET | Freq: Two times a day (BID) | ORAL | 0 refills | Status: AC

## 2022-03-04 MED ORDER — ACETAMINOPHEN 500 MG PO TABS: 500.0000 mg | ORAL_TABLET | Freq: Four times a day (QID) | ORAL | 0 refills | Status: AC | PRN

## 2022-03-04 MED ORDER — HYDROCODONE-ACETAMINOPHEN 5-325 MG PO TABS: 1.0000 | ORAL_TABLET | Freq: Three times a day (TID) | ORAL | 0 refills | Status: AC | PRN

## 2022-03-04 MED ORDER — BACITRACIN ZINC 500 UNIT/GM EX OINT: 1.0000 | TOPICAL_OINTMENT | Freq: Two times a day (BID) | CUTANEOUS | 0 refills | Status: DC

## 2022-03-04 MED ORDER — BACITRACIN ZINC 500 UNIT/GM EX OINT
TOPICAL_OINTMENT | CUTANEOUS | Status: AC
  Filled 2022-03-04: qty 0.9

## 2022-03-04 NOTE — ED Triage Notes (Signed)
Patient sent from urgent care. Stated that she has a boil/abscess under her left breast for 2 weeks. She tried to pop it but nothing came out. Redness spreading around her breast.

## 2022-03-04 NOTE — ED Provider Notes (Signed)
[facility] Provider Note   CSN: [id number] Arrival date & time: 03/04/22  1608     History  Chief Complaint  Patient presents with   Abscess         Amber Herring is a 61 y.o. female presenting due to a burst abscess to the left breast.  She says it was present for a week and ruptured today.  Says the pain has somewhat improved since the rupture.  No fevers.  Says that she has a history of getting boils like this and believes she might have a condition that she has seen on TV.  Has not seen PCP   Abscess      Home Medications Prior to Admission medications   Medication Sig Start Date End Date Taking? Authorizing Provider  acetaminophen (TYLENOL) 325 MG tablet Take 2 tablets (650 mg total) by mouth every 6 (six) hours as needed for mild pain (or Fever >/= 101). 05/06/21   Eugenie Filler, MD  amiodarone (PACERONE) 200 MG tablet Take 1 tablet (200 mg total) by mouth daily. 02/17/22   Hilty, Nadean Corwin, MD  apixaban (ELIQUIS) 5 MG TABS tablet TAKE 1 TABLET(5 MG) BY MOUTH TWICE DAILY 01/17/22   Hilty, Nadean Corwin, MD  APPLE CIDER VINEGAR PO Take 1 tablet by mouth every morning. gummies Patient not taking: Reported on 03/04/2022    [provider]  bumetanide (BUMEX) 1 MG tablet TAKE 1 TABLET(1 MG) BY MOUTH TWICE DAILY Patient taking differently: Take 1 mg by mouth 2 (two) times daily. TAKE 1 TABLET(1 MG) BY MOUTH TWICE DAILY 02/11/22   Lendon Colonel, NP  capsaicin (ZOSTRIX) 0.025 % cream Apply topically 2 (two) times daily. 05/06/21   Eugenie Filler, MD  diclofenac Sodium (VOLTAREN) 1 % GEL Apply 2 g topically 4 (four) times daily. 02/11/22   Lendon Colonel, NP  EPINEPHrine (PRIMATENE MIST) 0.125 MG/ACT AERO Inhale 1 puff into the lungs daily as needed (wheezing).    [provider]  ferrous sulfate 325 (65 FE) MG tablet Take 1 tablet (325 mg total) by mouth 3 (three) times daily with meals. 05/06/21   Eugenie Filler, MD  loratadine (CLARITIN) 10 MG tablet Take 1 tablet (10 mg total) by mouth daily as needed for allergies. 05/06/21   Eugenie Filler, MD  megestrol (MEGACE) 40 MG tablet Take 1 tablet (40 mg total) by mouth 2 (two) times daily. Patient not taking: Reported on 03/04/2022 05/06/21   Eugenie Filler, MD  metoprolol succinate (TOPROL-XL) 25 MG 24 hr tablet Take 0.5 tablets (12.5 mg total) by mouth daily. 02/17/22   Hilty, Nadean Corwin, MD  Multiple Vitamins-Minerals (ADULT ONE DAILY GUMMIES) CHEW Chew 2 tablets by mouth every morning.    [provider]  OVER THE COUNTER MEDICATION Take 1 capsule by mouth 2 (two) times daily. Beet root capsule    [provider]  OVER THE COUNTER MEDICATION Take 2 tablets by mouth every morning. Omega XL - Engineer, manufacturing, Historical, MD  OVER THE COUNTER MEDICATION Apply 1 application topically 3 (three) times daily as needed (knee pain). Otc foam for knee pain    [provider]  pantoprazole (PROTONIX) 40 MG tablet Take 1 tablet (40 mg total) by mouth daily. 05/07/21   Eugenie Filler, MD  polyethylene glycol (MIRALAX / GLYCOLAX) 17 g packet Take 17 g by mouth 2 (two) times daily. Patient not taking: Reported on 03/04/2022  05/06/21   Eugenie Filler, MD  pravastatin (PRAVACHOL) 20 MG tablet Take 1 tablet (20 mg total) by mouth every evening. 02/17/22 02/12/23  Hilty, Nadean Corwin, MD  senna-docusate (SENOKOT-S) 8.6-50 MG tablet Take 1 tablet by mouth 2 (two) times daily. Patient not taking: Reported on 03/04/2022 05/06/21   Eugenie Filler, MD  sodium chloride (OCEAN) 0.65 % SOLN nasal spray Place 1 spray into both nostrils as needed for congestion (nose irritation). Patient not taking: Reported on 03/04/2022 05/06/21   Eugenie Filler, MD  traMADol (ULTRAM) 50 MG tablet Take 1 tablet (50 mg total) by mouth every 6 (six) hours as needed for moderate pain. Patient not taking: Reported on 03/04/2022 05/06/21    Eugenie Filler, MD      Allergies    Patient has no known allergies.    Review of Systems   Review of Systems  Physical Exam Updated Vital Signs BP 133/87 (BP Location: Left Wrist)   Pulse 68   Temp 98.3 F (36.8 C) (Oral)   Resp 18   SpO2 93%  Physical Exam Vitals and nursing note reviewed.  Constitutional:      Appearance: Normal appearance.  HENT:     Head: Normocephalic and atraumatic.  Eyes:     General: No scleral icterus.    Conjunctiva/sclera: Conjunctivae normal.  Pulmonary:     Effort: Pulmonary effort is normal. No respiratory distress.  Chest:       Comments: 2x2 inch abscess to the left under breast.  Draining serosanguineous fluid.  No nipple discharge.  Cellulitis to the anterior breast.  Normal right breast exam Skin:    Findings: No rash.  Neurological:     Mental Status: She is alert.  Psychiatric:        Mood and Affect: Mood normal.     ED Results / Procedures / Treatments   Labs (all labs ordered are listed, but only abnormal results are displayed) Labs Reviewed - No data to display  EKG None  Radiology No results found.  Procedures Procedures   Medications Ordered in ED Medications - No data to display  ED Course/ Medical Decision Making/ A&P                           Medical Decision Making  60 year old female presenting with a breast abscess.  Differential includes but is not limited to simple abscess, cellulitis, necrotizing infection, malignancy.   This is not an exhaustive differential.    Past Medical History / Co-morbidities / Social History: Obesity and multiple other abscesses   Additional history: I reviewed patient's urgent care note and it appears she was sent here due to the surface area of the wound   Physical Exam: Pertinent physical exam findings include 2x2 inch abscess to the left under breast.  Mildly fluctuant but already draining  Lab Tests: Considered however there are no signs of systemic  infection, do not believe it is indicated at this time    Medications: Declines medications, says that she has not had anything to eat today   MDM/Disposition: This is a 61 year old female presenting with a breast abscess.  Has been present for a week but ruptured today.  Has a history of abscesses.  On physical exam there is a ruptured and draining abscess in the skin folds under patient's left breast.  Right breast was normal.  Physical exam more concerning for an abscess than malignancy.  Appears more  superficial.  Ultimately she will be started on antibiotics and given pain control.  She will need to follow-up with a PCP or women's clinic.   Final Clinical Impression(s) / ED Diagnoses Final diagnoses:  Breast abscess    Rx / DC Orders ED Discharge Orders          Ordered    bacitracin ointment  2 times daily        03/04/22 1753    sulfamethoxazole-trimethoprim (BACTRIM DS) 800-160 MG tablet  2 times daily        03/04/22 1753    HYDROcodone-acetaminophen (NORCO/VICODIN) 5-325 MG tablet  3 times daily PRN        03/04/22 1753    acetaminophen (TYLENOL) 500 MG tablet  Every 6 hours PRN        03/04/22 1753           Results and diagnoses were explained to the patient. Return precautions discussed in full. Patient had no additional questions and expressed complete understanding.   This chart was dictated using voice recognition software.  Despite best efforts to proofread,  errors can occur which can change the documentation meaning.    Rhae Hammock, PA-C 03/04/22 1756    Dorie Rank, MD 03/07/22 [phone]

## 2022-03-04 NOTE — ED Triage Notes (Signed)
Pt presents to [facility] with co of l breast abscess. Pt reports 1 week ago she noticed it, it popped today and she is concerned she needs an antibiotic. Pt reports pain is 9.75/10 that is all the time.

## 2022-03-04 NOTE — Discharge Instructions (Signed)
Please go to the ER as soon as you leave urgent care for further evaluation and management.

## 2022-03-04 NOTE — Discharge Instructions (Addendum)
You have a breast abscess that is already draining.  He the antibiotic I am starting you on is called Bactrim.  Take it as prescribed.  It may upset your stomach so take it with food.  I refilled your extra-strength Tylenol.  Additionally I sent a stronger pain medication to your pharmacy.  This is hydrocodone.  Please do not drive on this medication or operate machinery.  Keep it out of the reach of others as it is a controlled substance.  Additionally do not drink alcohol with it.   Dr. Redmond Pulling is a general surgeon who you may follow-up with.  Additionally, please see Lilia Pro to discuss the possibility of you having Hidradenitis suppurativa.  Return with any worsening symptoms, otherwise it was a pleasure to meet you and we hope you feel better!

## 2022-03-04 NOTE — ED Provider Notes (Signed)
[facility]    CSN: [id number] Arrival date & time: 03/04/22  1444      History   Chief Complaint Chief Complaint  Patient presents with   Abscess    HPI Amber Herring is a 61 y.o. female.   Patient presents with abscess underneath left breast that has been present for about a week.  She reports that it started draining today.  She denies any obvious fevers at home but states that she just simply "hasn't felt well" since it started.  She reports that she has had an abscess on her back in the past that had to be lanced and she received antibiotics for it.   Abscess   Past Medical History:  Diagnosis Date   Allergy    Anemia    iron defienency   Anxiety    GERD (gastroesophageal reflux disease)    Hyperlipidemia    Impaired fasting glucose    Metabolic syndrome    Obesity     Patient Active Problem List   Diagnosis Date Noted   Postmenopausal vaginal bleeding    Constipation    Acute on chronic combined systolic and diastolic CHF (congestive heart failure) ([place])    Atrial flutter ([place]) 04/17/2021   CAP (community acquired pneumonia) 04/17/2021   Pressure injury of skin 04/17/2021   Anasarca 04/16/2021   Cardiomegaly 04/16/2021   Chest pain 04/16/2021   Symptomatic anemia 01/25/2021   Morbid obesity ([place]) 01/25/2021   Vaginal bleeding 01/25/2021   Iron deficiency anemia due to chronic blood loss 01/25/2021   Hypokalemia 01/25/2021   Facial swelling 11/17/2006   Headache(784.0) 05/19/2005   Stress at work 11/17/2003   Urinary tract infection with hematuria 10/16/1997   Knee pain 06/16/1996   Sinusitis 02/17/1996   Back pain 10/16/1992    Past Surgical History:  Procedure Laterality Date   CARDIOVERSION N/A 04/26/2021   Procedure: CARDIOVERSION;  Surgeon: Freada Bergeron, MD;  Location: [facility] ENDOSCOPY;  Service: Cardiovascular;  Laterality: N/A;   COLONOSCOPY  04-2004   KNEE ARTHROSCOPY     TEE WITHOUT CARDIOVERSION N/A 04/26/2021    Procedure: TRANSESOPHAGEAL ECHOCARDIOGRAM (TEE);  Surgeon: Freada Bergeron, MD;  Location: [facility] ENDOSCOPY;  Service: Cardiovascular;  Laterality: N/A;    OB History   No obstetric history on file.      Home Medications    Prior to Admission medications   Medication Sig Start Date End Date Taking? Authorizing Provider  acetaminophen (TYLENOL) 325 MG tablet Take 2 tablets (650 mg total) by mouth every 6 (six) hours as needed for mild pain (or Fever >/= 101). 05/06/21   Eugenie Filler, MD  amiodarone (PACERONE) 200 MG tablet Take 1 tablet (200 mg total) by mouth daily. 02/17/22   Hilty, Nadean Corwin, MD  apixaban (ELIQUIS) 5 MG TABS tablet TAKE 1 TABLET(5 MG) BY MOUTH TWICE DAILY 01/17/22   Hilty, Nadean Corwin, MD  APPLE CIDER VINEGAR PO Take 1 tablet by mouth every morning. gummies Patient not taking: Reported on 03/04/2022    [provider]  bumetanide (BUMEX) 1 MG tablet TAKE 1 TABLET(1 MG) BY MOUTH TWICE DAILY Patient taking differently: Take 1 mg by mouth 2 (two) times daily. TAKE 1 TABLET(1 MG) BY MOUTH TWICE DAILY 02/11/22   Lendon Colonel, NP  capsaicin (ZOSTRIX) 0.025 % cream Apply topically 2 (two) times daily. 05/06/21   Eugenie Filler, MD  diclofenac Sodium (VOLTAREN) 1 % GEL Apply 2 g topically 4 (four) times daily. 02/11/22  Lendon Colonel, NP  EPINEPHrine (PRIMATENE MIST) 0.125 MG/ACT AERO Inhale 1 puff into the lungs daily as needed (wheezing).    [provider]  ferrous sulfate 325 (65 FE) MG tablet Take 1 tablet (325 mg total) by mouth 3 (three) times daily with meals. 05/06/21   Eugenie Filler, MD  loratadine (CLARITIN) 10 MG tablet Take 1 tablet (10 mg total) by mouth daily as needed for allergies. 05/06/21   Eugenie Filler, MD  megestrol (MEGACE) 40 MG tablet Take 1 tablet (40 mg total) by mouth 2 (two) times daily. Patient not taking: Reported on 03/04/2022 05/06/21   Eugenie Filler, MD  metoprolol succinate (TOPROL-XL) 25 MG 24  hr tablet Take 0.5 tablets (12.5 mg total) by mouth daily. 02/17/22   Hilty, Nadean Corwin, MD  Multiple Vitamins-Minerals (ADULT ONE DAILY GUMMIES) CHEW Chew 2 tablets by mouth every morning.    [provider]  OVER THE COUNTER MEDICATION Take 1 capsule by mouth 2 (two) times daily. Beet root capsule    [provider]  OVER THE COUNTER MEDICATION Take 2 tablets by mouth every morning. Omega XL - Engineer, manufacturing, Historical, MD  OVER THE COUNTER MEDICATION Apply 1 application topically 3 (three) times daily as needed (knee pain). Otc foam for knee pain    [provider]  pantoprazole (PROTONIX) 40 MG tablet Take 1 tablet (40 mg total) by mouth daily. 05/07/21   Eugenie Filler, MD  polyethylene glycol (MIRALAX / GLYCOLAX) 17 g packet Take 17 g by mouth 2 (two) times daily. Patient not taking: Reported on 03/04/2022 05/06/21   Eugenie Filler, MD  pravastatin (PRAVACHOL) 20 MG tablet Take 1 tablet (20 mg total) by mouth every evening. 02/17/22 02/12/23  Hilty, Nadean Corwin, MD  senna-docusate (SENOKOT-S) 8.6-50 MG tablet Take 1 tablet by mouth 2 (two) times daily. Patient not taking: Reported on 03/04/2022 05/06/21   Eugenie Filler, MD  sodium chloride (OCEAN) 0.65 % SOLN nasal spray Place 1 spray into both nostrils as needed for congestion (nose irritation). Patient not taking: Reported on 03/04/2022 05/06/21   Eugenie Filler, MD  traMADol (ULTRAM) 50 MG tablet Take 1 tablet (50 mg total) by mouth every 6 (six) hours as needed for moderate pain. Patient not taking: Reported on 03/04/2022 05/06/21   Eugenie Filler, MD    Family History Family History  Problem Relation Age of Onset   ALS Mother     Social History Social History   Tobacco Use   Smoking status: Former    Packs/day: 1.00    Years: 30.00    Total pack years: 30.00    Types: Cigarettes    Quit date: 2013    Years since quitting: 10.8   Smokeless tobacco: Never  Vaping Use    Vaping Use: Never used  Substance Use Topics   Alcohol use: Never   Drug use: Never     Allergies   Patient has no known allergies.   Review of Systems Review of Systems Per HPI  Physical Exam Triage Vital Signs ED Triage Vitals  Enc Vitals Group     BP 03/04/22 1512 119/81     Pulse --      Resp 03/04/22 1512 19     Temp 03/04/22 1512 98 F (36.7 C)     Temp src --      SpO2 03/04/22 1512 93 %     Weight --  Height --      Head Circumference --      Peak Flow --      Pain Score 03/04/22 1508 9     Pain Loc --      Pain Edu? --      Excl. in [place]? --    No data found.  Updated Vital Signs BP 119/81   Temp 98 F (36.7 C)   Resp 19   SpO2 93%   Visual Acuity Right Eye Distance:   Left Eye Distance:   Bilateral Distance:    Right Eye Near:   Left Eye Near:    Bilateral Near:     Physical Exam Exam conducted with a chaperone present.  Constitutional:      General: She is not in acute distress.    Appearance: Normal appearance. She is not toxic-appearing or diaphoretic.  HENT:     Head: Normocephalic and atraumatic.  Eyes:     Extraocular Movements: Extraocular movements intact.     Conjunctiva/sclera: Conjunctivae normal.  Pulmonary:     Effort: Pulmonary effort is normal.  Chest:       Comments: Patient has approximately 3 inch x 3 inch diameter draining abscess present to underside of left breast. She has significant amount of surrounding induration as well.  Erythema is present to approximately two thirds of her breast as noted on diagram. Neurological:     General: No focal deficit present.     Mental Status: She is alert and oriented to person, place, and time. Mental status is at baseline.  Psychiatric:        Mood and Affect: Mood normal.        Behavior: Behavior normal.        Thought Content: Thought content normal.        Judgment: Judgment normal.      UC Treatments / Results  Labs (all labs ordered are listed, but only  abnormal results are displayed) Labs Reviewed - No data to display  EKG   Radiology No results found.  Procedures Procedures (including critical care time)  Medications Ordered in UC Medications - No data to display  Initial Impression / Assessment and Plan / UC Course  I have reviewed the triage vital signs and the nursing notes.  Pertinent labs & imaging results that were available during my care of the patient were reviewed by me and considered in my medical decision making (see chart for details).     Patient has a very large and significant abscess present to left breast that is involving approximately two thirds of her breast.  Due to significance of abscess, do not think that treatment, drainage, evaluation is safe/warranted here in urgent care given limited resources.  Patient was advised that she will need to go to the hospital for further evaluation and management and was agreeable with plan.  Vital signs stable at discharge.  Agree with her daughter transporting her to the hospital. Final Clinical Impressions(s) / UC Diagnoses   Final diagnoses:  Breast abscess     Discharge Instructions      Please go to the ER as soon as you leave urgent care for further evaluation and management.     ED Prescriptions   None    PDMP not reviewed this encounter.   Teodora Medici, [place] 03/04/22 [phone]

## 2022-03-04 NOTE — ED Notes (Signed)
Wound care provided to abscess under left breast, dressing applied.

## 2022-03-15 ENCOUNTER — Ambulatory Visit (INDEPENDENT_AMBULATORY_CARE_PROVIDER_SITE_OTHER): Payer: Self-pay | Admitting: Family Medicine

## 2022-03-25 ENCOUNTER — Ambulatory Visit: Payer: Medicaid Other | Admitting: Adult Health

## 2022-04-27 ENCOUNTER — Ambulatory Visit (INDEPENDENT_AMBULATORY_CARE_PROVIDER_SITE_OTHER): Payer: Medicaid Other | Admitting: Orthopedic Surgery

## 2022-04-27 ENCOUNTER — Encounter: Payer: Self-pay | Admitting: Orthopedic Surgery

## 2022-04-27 DIAGNOSIS — M170 Bilateral primary osteoarthritis of knee: Secondary | ICD-10-CM

## 2022-04-27 MED ORDER — LIDOCAINE HCL 1 % IJ SOLN
5.0000 mL | INTRAMUSCULAR | Status: AC | PRN
  Administered 2022-04-27: 5 mL

## 2022-04-27 MED ORDER — METHYLPREDNISOLONE ACETATE 40 MG/ML IJ SUSP
40.0000 mg | INTRAMUSCULAR | Status: AC | PRN
  Administered 2022-04-27: 40 mg via INTRA_ARTICULAR

## 2022-04-27 MED ORDER — BUPIVACAINE HCL 0.25 % IJ SOLN
4.0000 mL | INTRAMUSCULAR | Status: AC | PRN
  Administered 2022-04-27: 4 mL via INTRA_ARTICULAR

## 2022-04-27 NOTE — Progress Notes (Signed)
Office Visit Note   Patient: Amber Herring           Date of Birth: 07-31-1960           MRN: [id number] Visit Date: 04/27/2022 Requested by: Jorge Ny, PA-C [address] [place],  [place] [address] PCP: Jorge Ny, PA-C  Subjective: Chief Complaint  Patient presents with   Right Knee - Pain   Left Knee - Pain    HPI: Amber Herring is a 62 y.o. female who presents to the office reporting bilateral knee pain and arthritis.  Injections have helped her in the past.  She is getting around in a wheelchair.  No interval history of injury or trauma.  Patient states that the injections helped her pain but the "crunching" still persist.  This is not unexpected based on the amount of arthritis that she has.                ROS: All systems reviewed are negative as they relate to the chief complaint within the history of present illness.  Patient denies fevers or chills.  Assessment & Plan: Visit Diagnoses:  1. Bilateral primary osteoarthritis of knee     Plan: Impression is bilateral knee arthritis.  Bilateral knee injections performed today.  No effusion in either knee.  Left knee has more deformity than the right in terms of flexion contracture.  Follow-up in 4 to 6 months for repeat injections if she finds these to be beneficial.  Follow-Up Instructions: No follow-ups on file.   Orders:  No orders of the defined types were placed in this encounter.  No orders of the defined types were placed in this encounter.     Procedures: Large Joint Inj: bilateral knee on 04/27/2022 10:10 PM Indications: diagnostic evaluation, joint swelling and pain Details: 18 G 1.5 in needle, superolateral approach  Arthrogram: No  Medications (Right): 5 mL lidocaine 1 %; 4 mL bupivacaine 0.25 %; 40 mg methylPREDNISolone acetate 40 MG/ML Medications (Left): 5 mL lidocaine 1 %; 4 mL bupivacaine 0.25 %; 40 mg methylPREDNISolone acetate 40 MG/ML Outcome: tolerated well, no immediate  complications Procedure, treatment alternatives, risks and benefits explained, specific risks discussed. Consent was given by the patient. Immediately prior to procedure a time out was called to verify the correct patient, procedure, equipment, support staff and site/side marked as required. Patient was prepped and draped in the usual sterile fashion.       Clinical Data: No additional findings.  Objective: Vital Signs: There were no vitals taken for this visit.  Physical Exam:  Constitutional: Patient appears well-developed HEENT:  Head: Normocephalic Eyes:EOM are normal Neck: Normal range of motion Cardiovascular: Normal rate Pulmonary/chest: Effort normal Neurologic: Patient is alert Skin: Skin is warm Psychiatric: Patient has normal mood and affect  Ortho Exam: Ortho exam demonstrates range of motion on the left of 15-80.  On the right 5-85.  Patient does have bilateral 1-2+ edema in both lower extremities.  Pedal pulses palpable with ankle dorsiflexion intact.  Extensor mechanism intact.  Skin intact in both knee regions.  Specialty Comments:  No specialty comments available.  Imaging: No results found.   PMFS History: Patient Active Problem List   Diagnosis Date Noted   Postmenopausal vaginal bleeding    Constipation    Acute on chronic combined systolic and diastolic CHF (congestive heart failure) ([place])    Atrial flutter ([place]) 04/17/2021   CAP (community acquired pneumonia) 04/17/2021   Pressure injury of skin 04/17/2021  Anasarca 04/16/2021   Cardiomegaly 04/16/2021   Chest pain 04/16/2021   Symptomatic anemia 01/25/2021   Morbid obesity ([place]) 01/25/2021   Vaginal bleeding 01/25/2021   Iron deficiency anemia due to chronic blood loss 01/25/2021   Hypokalemia 01/25/2021   Facial swelling 11/17/2006   Headache(784.0) 05/19/2005   Stress at work 11/17/2003   Urinary tract infection with hematuria 10/16/1997   Knee pain 06/16/1996   Sinusitis 02/17/1996    Back pain 10/16/1992   Past Medical History:  Diagnosis Date   Allergy    Anemia    iron defienency   Anxiety    Arthritis    CHF (congestive heart failure) ([facility])    GERD (gastroesophageal reflux disease)    Hyperlipidemia    Impaired fasting glucose    Metabolic syndrome    Obesity     Family History  Problem Relation Age of Onset   ALS Mother     Past Surgical History:  Procedure Laterality Date   CARDIOVERSION N/A 04/26/2021   Procedure: CARDIOVERSION;  Surgeon: Freada Bergeron, MD;  Location: [facility] ENDOSCOPY;  Service: Cardiovascular;  Laterality: N/A;   COLONOSCOPY  04-2004   KNEE ARTHROSCOPY     TEE WITHOUT CARDIOVERSION N/A 04/26/2021   Procedure: TRANSESOPHAGEAL ECHOCARDIOGRAM (TEE);  Surgeon: Freada Bergeron, MD;  Location: [facility] ENDOSCOPY;  Service: Cardiovascular;  Laterality: N/A;   Social History   Occupational History   Not on file  Tobacco Use   Smoking status: Former    Packs/day: 1.00    Years: 30.00    Total pack years: 30.00    Types: Cigarettes    Quit date: 2013    Years since quitting: 11.0   Smokeless tobacco: Never  Vaping Use   Vaping Use: Never used  Substance and Sexual Activity   Alcohol use: Never   Drug use: Never   Sexual activity: Not Currently

## 2022-04-29 ENCOUNTER — Encounter: Payer: Self-pay | Admitting: Nurse Practitioner

## 2022-04-29 ENCOUNTER — Ambulatory Visit: Payer: Medicaid Other | Attending: Adult Health | Admitting: Nurse Practitioner

## 2022-04-29 VITALS — BP 124/75 | HR 60 | Ht 69.0 in | Wt 351.0 lb

## 2022-04-29 DIAGNOSIS — I480 Paroxysmal atrial fibrillation: Secondary | ICD-10-CM | POA: Diagnosis not present

## 2022-04-29 DIAGNOSIS — E782 Mixed hyperlipidemia: Secondary | ICD-10-CM

## 2022-04-29 DIAGNOSIS — I10 Essential (primary) hypertension: Secondary | ICD-10-CM

## 2022-04-29 DIAGNOSIS — I5022 Chronic systolic (congestive) heart failure: Secondary | ICD-10-CM

## 2022-04-29 DIAGNOSIS — Z87898 Personal history of other specified conditions: Secondary | ICD-10-CM

## 2022-04-29 NOTE — Patient Instructions (Signed)
Medication Instructions:  Your physician recommends that you continue on your current medications as directed. Please refer to the Current Medication list given to you today.   *If you need a refill on your cardiac medications before your next appointment, please call your pharmacy*   Lab Work: NONE ordered at this time of appointment   If you have labs (blood work) drawn today and your tests are completely normal, you will receive your results only by: March ARB (if you have MyChart) OR A paper copy in the mail If you have any lab test that is abnormal or we need to change your treatment, we will call you to review the results.   Testing/Procedures: Your physician has recommended that you have a sleep study. This test records several body functions during sleep, including: brain activity, eye movement, oxygen and carbon dioxide blood levels, heart rate and rhythm, breathing rate and rhythm, the flow of air through your mouth and nose, snoring, body muscle movements, and chest and belly movement.   Follow-Up: At [facility], you and your health needs are our priority.  As part of our continuing mission to provide you with exceptional heart care, we have created designated Provider Care Teams.  These Care Teams include your primary Cardiologist (physician) and Advanced Practice Providers (APPs -  Physician Assistants and Nurse Practitioners) who all work together to provide you with the care you need, when you need it.  We recommend signing up for the patient portal called "MyChart".  Sign up information is provided on this After Visit Summary.  MyChart is used to connect with patients for Virtual Visits (Telemedicine).  Patients are able to view lab/test results, encounter notes, upcoming appointments, etc.  Non-urgent messages can be sent to your provider as well.   To learn more about what you can do with MyChart, go to [email or web address].    Your next appointment:   3  month(s)  Provider:   Jory Sims, DNP, ANP or Diona Browner, NP        Other Instructions

## 2022-04-29 NOTE — Progress Notes (Signed)
Office Visit    Patient Name: Amber Herring Date of Encounter: 04/29/2022  Primary Care Provider:  Jorge Ny, PA-C Primary Cardiologist:  Pixie Casino, MD  Chief Complaint    62 year old female with a history of chronic systolic heart failure, paroxysmal atrial fibrillation, hypertension, hyperlipidemia, snoring, iron deficiency anemia, GERD, and obesity who presents for follow-up related to heart failure and atrial fibrillation.  Past Medical History    Past Medical History:  Diagnosis Date   Allergy    Anemia    iron defienency   Anxiety    Arthritis    CHF (congestive heart failure) (HCC)    GERD (gastroesophageal reflux disease)    Hyperlipidemia    Impaired fasting glucose    Metabolic syndrome    Obesity    Past Surgical History:  Procedure Laterality Date   CARDIOVERSION N/A 04/26/2021   Procedure: CARDIOVERSION;  Surgeon: Freada Bergeron, MD;  Location: [facility] ENDOSCOPY;  Service: Cardiovascular;  Laterality: N/A;   COLONOSCOPY  04-2004   KNEE ARTHROSCOPY     TEE WITHOUT CARDIOVERSION N/A 04/26/2021   Procedure: TRANSESOPHAGEAL ECHOCARDIOGRAM (TEE);  Surgeon: Freada Bergeron, MD;  Location[facility] ENDOSCOPY;  Service: Cardiovascular;  Laterality: N/A;    Allergies  No Known Allergies   Labs/Other Studies Reviewed    The following studies were reviewed today: TEE-DCCV 04/26/21:  1. Left ventricular ejection fraction, by estimation, is 45 to 50%. The  left ventricle has low normal function.   2. Right ventricular systolic function is mildly reduced. The right  ventricular size is moderately enlarged.   3. Left atrial size was mildly dilated. No left atrial/left atrial  appendage thrombus was detected.   4. Right atrial size was severely dilated.   5. The mitral valve is normal in structure. Trivial mitral valve  regurgitation.   6. There is severe tricuspid regurgitation likely due to annular dilation  in the setting of severe RA enlargement.    7. The aortic valve is tricuspid. Aortic valve regurgitation is trivial.   8. TEE was aborted early due to patient hypoxia during examination.   9. Following TEE and improvement of hypoxia, patient was successfully  cardioverted back to sinus bradycardia.    Echocardiogram 04/17/2021  1. Left ventricular ejection fraction, by estimation, is 45 to 50%. Left  ventricular ejection fraction by 2D MOD biplane is 46.2 %. The left  ventricle has mildly decreased function. The left ventricle demonstrates  global hypokinesis. Indeterminate  diastolic filling due to E-A fusion.   2. Right ventricular systolic function is normal. The right ventricular  size is moderately enlarged. There is mildly elevated pulmonary artery  systolic pressure. The estimated right ventricular systolic pressure is  98.9 mmHg.   3. Right atrial size was severely dilated.   4. The mitral valve is abnormal. Mild mitral valve regurgitation.   5. Tricuspid valve regurgitation is mild to moderate.   6. The aortic valve is tricuspid. Aortic valve regurgitation is not  visualized.   7. The inferior vena cava is dilated in size with <50% respiratory  variability, suggesting right atrial pressure of 15 mmHg.   Recent Labs: 05/06/2021: Magnesium 1.9 08/13/2021: ALT 26; Hemoglobin 13.3; Platelets 298 02/11/2022: BUN 15; Creatinine, Ser 0.79; Potassium 4.1; Sodium 141; TSH 2.680  Recent Lipid Panel    Component Value Date/Time   CHOL 222 (H) 08/13/2021 1112   TRIG 67 08/13/2021 1112   HDL 68 08/13/2021 1112   CHOLHDL 3.3 08/13/2021 1112  [place] 142 (H) 08/13/2021 1112    History of Present Illness    62 year old female with the above past medical history including chronic systolic heart failure, paroxysmal atrial fibrillation, hypertension, hyperlipidemia, snoring, iron deficiency anemia, GERD, and obesity.   She was hospitalized in January 2023 in the setting of acute on chronic systolic heart failure, CAP, atrial  fibrillation.  Echocardiogram showed EF 45 to 50%, mildly decreased LV function, indeterminate diastolic parameters, normal RV systolic function, mild mitral valve regurgitation, mild to moderate tricuspid valve regurgitation.  She underwent TEE/DCCV with restoration of NSR.  She has remained on Eliquis, amiodarone, and metoprolol she was last seen in the office on 02/11/2022 and was stable from a cardiac standpoint.  She history.  STOP-BANG score of 7.  Home sleep study was recommended but not ordered  She presents today for follow-up accompanied by her daughter.  Since her last visit he has been stable from a cardiac standpoint.  She is wondering why she never heard anything about a sleep study.  Additionally, she is wanting to discuss possible initiation of GLP-1 agonist for weight loss.  She denies any palpitations, chest pain, dyspnea, edema, PND, orthopnea, weight gain.   Home Medications    Current Outpatient Medications  Medication Sig Dispense Refill   acetaminophen (TYLENOL) 500 MG tablet Take 1 tablet (500 mg total) by mouth every 6 (six) hours as needed. 30 tablet 0   amiodarone (PACERONE) 200 MG tablet Take 1 tablet (200 mg total) by mouth daily. 90 tablet 3   apixaban (ELIQUIS) 5 MG TABS tablet TAKE 1 TABLET(5 MG) BY MOUTH TWICE DAILY 180 tablet 1   APPLE CIDER VINEGAR PO Take 1 tablet by mouth every morning. gummies     bacitracin ointment Apply 1 Application topically 2 (two) times daily. 120 g 0   bumetanide (BUMEX) 1 MG tablet TAKE 1 TABLET(1 MG) BY MOUTH TWICE DAILY (Patient taking differently: Take 1 mg by mouth 2 (two) times daily. TAKE 1 TABLET(1 MG) BY MOUTH TWICE DAILY) 90 tablet 3   capsaicin (ZOSTRIX) 0.025 % cream Apply topically 2 (two) times daily. 60 g 0   diclofenac Sodium (VOLTAREN) 1 % GEL Apply 2 g topically 4 (four) times daily.     EPINEPHrine (PRIMATENE MIST) 0.125 MG/ACT AERO Inhale 1 puff into the lungs daily as needed (wheezing).     ferrous sulfate 325 (65  FE) MG tablet Take 1 tablet (325 mg total) by mouth 3 (three) times daily with meals.  3   HYDROcodone-acetaminophen (NORCO/VICODIN) 5-325 MG tablet Take 1 tablet by mouth 3 (three) times daily as needed. 10 tablet 0   loratadine (CLARITIN) 10 MG tablet Take 1 tablet (10 mg total) by mouth daily as needed for allergies. 30 tablet 0   megestrol (MEGACE) 40 MG tablet Take 1 tablet (40 mg total) by mouth 2 (two) times daily. 60 tablet 1   metoprolol succinate (TOPROL-XL) 25 MG 24 hr tablet Take 0.5 tablets (12.5 mg total) by mouth daily. 45 tablet 3   Multiple Vitamins-Minerals (ADULT ONE DAILY GUMMIES) CHEW Chew 2 tablets by mouth every morning.     OVER THE COUNTER MEDICATION Take 1 capsule by mouth 2 (two) times daily. Beet root capsule     OVER THE COUNTER MEDICATION Take 2 tablets by mouth every morning. Omega XL - purchased online     OVER THE COUNTER MEDICATION Apply 1 application topically 3 (three) times daily as needed (knee pain). Otc foam for knee pain  pantoprazole (PROTONIX) 40 MG tablet Take 1 tablet (40 mg total) by mouth daily. 30 tablet 1   polyethylene glycol (MIRALAX / GLYCOLAX) 17 g packet Take 17 g by mouth 2 (two) times daily. 72 each 1   pravastatin (PRAVACHOL) 20 MG tablet Take 1 tablet (20 mg total) by mouth every evening. 90 tablet 3   senna-docusate (SENOKOT-S) 8.6-50 MG tablet Take 1 tablet by mouth 2 (two) times daily. 60 tablet 1   sodium chloride (OCEAN) 0.65 % SOLN nasal spray Place 1 spray into both nostrils as needed for congestion (nose irritation).  0   traMADol (ULTRAM) 50 MG tablet Take 1 tablet (50 mg total) by mouth every 6 (six) hours as needed for moderate pain. 15 tablet 0   No current facility-administered medications for this visit.     Review of Systems    She denies chest pain, palpitations, dyspnea, pnd, orthopnea, n, v, dizziness, syncope, edema, weight gain, or early satiety. All other systems reviewed and are otherwise negative except as noted  above.   Physical Exam    VS:  BP 124/75   Pulse 60   Ht _0  (1.753 m)   Wt (!) 351 lb (159.2 kg)   SpO2 95%   BMI 51.83 kg/m  , BMI Body mass index is 51.83 kg/m. STOP-Bang Score:  7      GEN: Well nourished, well developed, in no acute distress. HEENT: normal. Neck: Supple, no JVD, carotid bruits, or masses. Cardiac: RRR, no murmurs, rubs, or gallops. No clubbing, cyanosis, edema.  Radials/DP/PT 2+ and equal bilaterally.  Respiratory:  Respirations regular and unlabored, clear to auscultation bilaterally. GI: Obese, soft, nontender, nondistended, BS + x 4. MS: no deformity or atrophy. Skin: warm and dry, no rash. Neuro:  Strength and sensation are intact. Psych: Normal affect.  Accessory Clinical Findings    ECG personally reviewed by me today -No EKG in office today.   Lab Results  Component Value Date   WBC 7.3 08/13/2021   HGB 13.3 08/13/2021   HCT 39.6 08/13/2021   MCV 89 08/13/2021   PLT 298 08/13/2021   Lab Results  Component Value Date   CREATININE 0.79 02/11/2022   BUN 15 02/11/2022   NA 141 02/11/2022   K 4.1 02/11/2022   CL 99 02/11/2022   CO2 21 02/11/2022   Lab Results  Component Value Date   ALT 26 08/13/2021   AST 24 08/13/2021   ALKPHOS 98 08/13/2021   BILITOT 0.5 08/13/2021   Lab Results  Component Value Date   CHOL 222 (H) 08/13/2021   HDL 68 08/13/2021   LDLCALC 142 (H) 08/13/2021   TRIG 67 08/13/2021   CHOLHDL 3.3 08/13/2021    No results found for: "HGBA1C"  Assessment & Plan    1. Paroxysmal atrial fibrillation:  Maintaining NSR on exam today.  Denies any recent palpitations. Continue amiodarone, metoprolol, Eliquis.  2. Chronic systolic heart failure:  Echo in 04/2021 showed EF 45 to 50%, mildly decreased LV function, indeterminate diastolic parameters, normal RV systolic function, mild mitral valve regurgitation, mild to moderate tricuspid valve regurgitation. Of note,pt states her last documented weight is inaccurate.  Today's documented weight is closer to her baseline. Euvolemic and well compensated on exam.  Continue metoprolol, Bumex.  3. Hypertension: BP well controlled. Continue current antihypertensive regimen.   4. Hyperlipidemia: LDL was 142 in 07/2021.  Continue pravastatin.  5. History of snoring: Epworth scale of 5, STOP-BANG score of 7.  Sleep  study was recommended at her last visit, however, this was never completed.  Will order home sleep study ([facility]) to screen for OSA.   6. Obesity: I advised pt to discuss GLP-1 agonist with her PCP, however, she stated her PCP told her she was not comfortable prescribing this medication. I will discuss with our pharmacy team recommendations for initiation of GLP-1 agonist [facility]).  7. Disposition: Follow-up in 3 months.      Lenna Sciara, NP 04/29/2022, 6:02 PM

## 2022-05-04 ENCOUNTER — Telehealth: Payer: Self-pay

## 2022-05-04 ENCOUNTER — Telehealth: Payer: Self-pay | Admitting: *Deleted

## 2022-05-04 NOTE — Telephone Encounter (Signed)
Called and made the patient aware that SHE may proceed with the [facility] Sleep Study. PIN # provided to the patient. Patient made aware that SHE will be contacted after the test has been read with the results and any recommendations. Patient verbalized understanding and thanked me for the call.  SHE

## 2022-05-04 NOTE — Telephone Encounter (Signed)
Prior Authorization for [employer] sent to [place] via Fax. Per [facility] no PA is required. Stacy notified ok to have the patient to activate the device.

## 2022-05-06 ENCOUNTER — Encounter (INDEPENDENT_AMBULATORY_CARE_PROVIDER_SITE_OTHER): Payer: Medicaid Other | Admitting: Cardiology

## 2022-05-06 DIAGNOSIS — G4733 Obstructive sleep apnea (adult) (pediatric): Secondary | ICD-10-CM | POA: Diagnosis not present

## 2022-05-08 NOTE — Procedures (Signed)
Patient Information Study Date: 05/06/2022 Patient Name: Amber Herring Patient ID: [id number] Birth Date: Jul 29, 1960 Age: 62 Gender: Female BMI: 51.9 (W=350 lb, H=5' 9'') Referring Physician: Diona Browner, NP  TEST DESCRIPTION: Home sleep apnea testing was completed using the WatchPat, a Type 1 device, utilizing peripheral arterial tonometry (PAT), chest movement, actigraphy, pulse oximetry, pulse rate, body position and snore.  AHI was calculated with apnea and hypopnea using valid sleep time as the denominator. RDI includes apneas, hypopneas, and RERAs.  The data acquired and the scoring of sleep and all associated events were performed in accordance with the recommended standards and specifications as outlined in the AASM Manual for the Scoring of Sleep and Associated Events 2.2.0 (2015).  FINDINGS:  1.  Severe Obstructive Sleep Apnea with AHI 47.4/hr.   2.  No Central Sleep Apnea with pAHIc 2.7hr.  3.  Oxygen desaturations as low as 74%.  4.  Severe snoring was present. O2 sats were < 88% for 27.9 min.  5.  Total sleep time was 6 hrs and 45 min.  6.  23.7% of total sleep time was spent in REM sleep.   7.  Shortened sleep onset latency at 6 min.   8.  Prolonged REM sleep onset latency at 118 min.   9.  Total awakenings were 25.  10. Arrhythmia detection:  None  DIAGNOSIS:   Severe Obstructive Sleep Apnea (G47.33) Nocturnal Hypoxemia  RECOMMENDATIONS:   1.  Clinical correlation of these findings is necessary.  The decision to treat obstructive sleep apnea (OSA) is usually based on the presence of apnea symptoms or the presence of associated medical conditions such as Hypertension, Congestive Heart Failure, Atrial Fibrillation or Obesity.  The most common symptoms of OSA are snoring, gasping for breath while sleeping, daytime sleepiness and fatigue.   2.  Initiating apnea therapy is recommended given the presence of symptoms and/or associated conditions. Recommend proceeding with  one of the following:     a.  Auto-CPAP therapy with a pressure range of 5-20cm H2O.     b.  An oral appliance (OA) that can be obtained from certain dentists with expertise in sleep medicine.  These are primarily of use in non-obese patients with mild and moderate disease.     c.  An ENT consultation which may be useful to look for specific causes of obstruction and possible treatment options.     d.  If patient is intolerant to PAP therapy, consider referral to ENT for evaluation for hypoglossal nerve stimulator.   3.  Close follow-up is necessary to ensure success with CPAP or oral appliance therapy for maximum benefit.  4.  A follow-up oximetry study on CPAP is recommended to assess the adequacy of therapy and determine the need for supplemental oxygen or the potential need for Bi-level therapy.  An arterial blood gas to determine the adequacy of baseline ventilation and oxygenation should also be considered.  5.  Healthy sleep recommendations include:  adequate nightly sleep (normal 7-9 hrs/night), avoidance of caffeine after noon and alcohol near bedtime, and maintaining a sleep environment that is cool, dark and quiet.  6.  Weight loss for overweight patients is recommended.  Even modest amounts of weight loss can significantly improve the severity of sleep apnea.  7.  Snoring recommendations include:  weight loss where appropriate, side sleeping, and avoidance of alcohol before bed.  8.  Operation of motor vehicle should be avoided when sleepy.  Signature: Fransico Him, MD; [facility]; [place], [place] Board  of Sleep Medicine Electronically Signed: 05/09/2022

## 2022-05-09 ENCOUNTER — Ambulatory Visit: Payer: Medicaid Other | Attending: Nurse Practitioner

## 2022-05-09 DIAGNOSIS — Z87898 Personal history of other specified conditions: Secondary | ICD-10-CM

## 2022-06-24 ENCOUNTER — Telehealth: Payer: Self-pay | Admitting: *Deleted

## 2022-06-24 DIAGNOSIS — G4733 Obstructive sleep apnea (adult) (pediatric): Secondary | ICD-10-CM

## 2022-06-24 DIAGNOSIS — I10 Essential (primary) hypertension: Secondary | ICD-10-CM

## 2022-06-24 DIAGNOSIS — Z87898 Personal history of other specified conditions: Secondary | ICD-10-CM

## 2022-06-24 NOTE — Telephone Encounter (Signed)
-----   Message from Lauralee Evener, [place] sent at 05/09/2022  9:55 AM EST -----  ----- Message ----- From: Sueanne Margarita, MD Sent: 05/08/2022   9:42 PM EST To: [facility] Sleep Studies  Please let patient know that they have sleep apnea.  Recommend therapeutic CPAP titration for treatment of patient's sleep disordered breathing.  If unable to perform an in lab titration then initiate ResMed auto CPAP from 4 to 15cm H2O with heated humidity and mask of choice and overnight pulse ox on CPAP.

## 2022-06-24 NOTE — Telephone Encounter (Signed)
The patient has been notified of the result and verbalized understanding.  All questions (if any) were answered. Amber Herring, [place] 06/24/2022 12:02 PM    Patient is not able to sleep in the lab so will Per Dr Radford Pax: If unable to perform an in lab titration then initiate ResMed auto CPAP from 4 to 15cm H2O with heated humidity and mask of choice and overnight pulse ox on CPAP.    Upon patient request DME selection is [facility]. Patient understands he will be contacted by [place] to set up his cpap. Patient understands to call if [facility] does not contact him with new setup in a timely manner. Patient understands they will be called once confirmation has been received from [facility]/ that they have received their new machine to schedule 10 week follow up appointment.   [place] notified of new cpap order  Please add to airview Patient was grateful for the call and thanked me.

## 2022-07-29 ENCOUNTER — Ambulatory Visit: Payer: Medicaid Other

## 2022-08-01 ENCOUNTER — Other Ambulatory Visit: Payer: Self-pay | Admitting: Internal Medicine

## 2022-08-01 DIAGNOSIS — I5043 Acute on chronic combined systolic (congestive) and diastolic (congestive) heart failure: Secondary | ICD-10-CM

## 2022-08-01 DIAGNOSIS — I480 Paroxysmal atrial fibrillation: Secondary | ICD-10-CM

## 2022-08-01 DIAGNOSIS — D500 Iron deficiency anemia secondary to blood loss (chronic): Secondary | ICD-10-CM

## 2022-08-01 NOTE — Telephone Encounter (Signed)
Prescription refill request for Eliquis received. Indication: AF Last office visit: 02/11/22  Harriet Pho NP Scr: 0.79 on 02/11/22 Age: 62 Weight: 137.9kg  Based on above findings Eliquis 5mg  twice daily is the appropriate dose.  Refill approved.

## 2022-08-05 ENCOUNTER — Ambulatory Visit (INDEPENDENT_AMBULATORY_CARE_PROVIDER_SITE_OTHER): Payer: Medicaid Other | Admitting: Surgical

## 2022-08-05 ENCOUNTER — Other Ambulatory Visit (INDEPENDENT_AMBULATORY_CARE_PROVIDER_SITE_OTHER): Payer: Medicaid Other

## 2022-08-05 DIAGNOSIS — G8929 Other chronic pain: Secondary | ICD-10-CM | POA: Diagnosis not present

## 2022-08-05 DIAGNOSIS — M25552 Pain in left hip: Secondary | ICD-10-CM

## 2022-08-05 DIAGNOSIS — M545 Low back pain, unspecified: Secondary | ICD-10-CM | POA: Diagnosis not present

## 2022-08-05 DIAGNOSIS — M1711 Unilateral primary osteoarthritis, right knee: Secondary | ICD-10-CM

## 2022-08-05 DIAGNOSIS — M170 Bilateral primary osteoarthritis of knee: Secondary | ICD-10-CM

## 2022-08-05 DIAGNOSIS — M1712 Unilateral primary osteoarthritis, left knee: Secondary | ICD-10-CM | POA: Diagnosis not present

## 2022-08-06 ENCOUNTER — Encounter: Payer: Self-pay | Admitting: Surgical

## 2022-08-06 MED ORDER — METHYLPREDNISOLONE ACETATE 40 MG/ML IJ SUSP
40.0000 mg | INTRAMUSCULAR | Status: AC | PRN
  Administered 2022-08-05: 40 mg via INTRA_ARTICULAR

## 2022-08-06 MED ORDER — BUPIVACAINE HCL 0.25 % IJ SOLN
4.0000 mL | INTRAMUSCULAR | Status: AC | PRN
  Administered 2022-08-05: 4 mL via INTRA_ARTICULAR

## 2022-08-06 MED ORDER — LIDOCAINE HCL 1 % IJ SOLN
5.0000 mL | INTRAMUSCULAR | Status: AC | PRN
  Administered 2022-08-05: 5 mL

## 2022-08-06 NOTE — Progress Notes (Signed)
Office Visit Note   Patient: Amber Herring           Date of Birth: 1960/07/03           MRN: [id number] Visit Date: 08/05/2022 Requested by: Quita Skye, PA-C [id number] [place],  [place] [address] PCP: Quita Skye, PA-C  Subjective: Chief Complaint  Patient presents with   Right Knee - Pain   Left Knee - Pain    HPI: Amber Herring is a 62 y.o. female who presents to the office reporting bilateral knee pain.  She has history of bilateral knee severe arthritis.  Last injections provided decent relief for several months.  She would like to repeat these injections today.  Gel injections were not covered by her insurance..    She also complains of left low back and buttock pain.  She has no radicular pain.  She notices groin pain on occasion but this is only about once a month.  She feels a slipping sensation when she moves around in bed at night and it will wake her up and cause severe pain for about 5 seconds.  She denies any bowel or bladder incontinence or saddle anesthesia.  She has no radicular pain down either leg.  No numbness or tingling or burning sensation.  Taking Tylenol for pain.  Cannot take NSAIDs due to history of taking Eliquis.              ROS: All systems reviewed are negative as they relate to the chief complaint within the history of present illness.  Patient denies fevers or chills.  Assessment & Plan: Visit Diagnoses:  1. Bilateral primary osteoarthritis of knee   2. Pain in left hip   3. Chronic left-sided low back pain without sciatica     Plan: Patient is a 62 year old female who presents for evaluation of left buttock pain and low back pain.  She has radiographs of the lumbar spine demonstrating spondylolisthesis of L4-L5 with diffuse moderate to severe degenerative changes of the facet joints at multiple levels.  Not really having any significant radicular pain but she does describe a slipping sensation that wakes her up at night at gives her  about 5 seconds of severe pain.  In addition to the radiographs of the lumbar spine, we reviewed her CT scan of abdomen and pelvis from about a year ago that demonstrates moderate degenerative changes of the SI joint on the left with no significant degenerative changes of the SI joint on the right.  We discussed options available to patient including living with her symptoms versus MRI of the lumbar spine for further evaluation versus diagnostic left SI joint injection with Dr. Shon Baton.  She would like to try MRI of the lumbar spine for further evaluation.  Follow-up after MRI to review results.  Additionally, patient has history of bilateral knee osteoarthritis.  She would like to repeat cortisone injections today.  These were administered and patient tolerated procedure well.  She will follow-up in 3 months for repeat injections.  Follow-Up Instructions: No follow-ups on file.   Orders:  Orders Placed This Encounter  Procedures   XR HIP UNILAT W OR W/O PELVIS 2-3 VIEWS LEFT   XR Lumbar Spine 2-3 Views   No orders of the defined types were placed in this encounter.     Procedures: Large Joint Inj: bilateral knee on 08/05/2022 11:34 AM Indications: diagnostic evaluation, joint swelling and pain Details: 18 G 1.5 in needle, superolateral approach  Arthrogram: No  Medications (Right): 5 mL lidocaine 1 %; 4 mL bupivacaine 0.25 %; 40 mg methylPREDNISolone acetate 40 MG/ML Medications (Left): 5 mL lidocaine 1 %; 4 mL bupivacaine 0.25 %; 40 mg methylPREDNISolone acetate 40 MG/ML Outcome: tolerated well, no immediate complications Procedure, treatment alternatives, risks and benefits explained, specific risks discussed. Consent was given by the patient. Immediately prior to procedure a time out was called to verify the correct patient, procedure, equipment, support staff and site/side marked as required. Patient was prepped and draped in the usual sterile fashion.       Clinical Data: No  additional findings.  Objective: Vital Signs: There were no vitals taken for this visit.  Physical Exam:  Constitutional: Patient appears well-developed HEENT:  Head: Normocephalic Eyes:EOM are normal Neck: Normal range of motion Cardiovascular: Normal rate Pulmonary/chest: Effort normal Neurologic: Patient is alert Skin: Skin is warm Psychiatric: Patient has normal mood and affect  Ortho Exam: Ortho exam demonstrates right knee with 10 degree flexion contracture and flexion to about 110 degrees.  She has left knee with 25 to 30 degree flexion contracture and flexion to 110 degrees.  No calf tenderness.  Negative Homans' sign.  No pain with hip range of motion bilaterally.  Able to perform straight leg raise bilaterally.  She has intact hip flexion, quadricep, hamstring, dorsiflexion, plantarflexion bilaterally.  No clonus bilaterally.  Trace effusion in either knee.  She has tenderness over the medial and lateral joint lines of both knees.  No tenderness over the trochanter of the left hip.  She has tenderness diffusely throughout the axial lumbar spine but mostly around the level of L4-L5.  She also has some diffuse tenderness through the area of the SI joint but physical exam is somewhat limited by patient's body habitus.  Specialty Comments:  No specialty comments available.  Imaging: No results found.   PMFS History: Patient Active Problem List   Diagnosis Date Noted   Postmenopausal vaginal bleeding    Constipation    Acute on chronic combined systolic and diastolic CHF (congestive heart failure)    Atrial flutter 04/17/2021   CAP (community acquired pneumonia) 04/17/2021   Pressure injury of skin 04/17/2021   Anasarca 04/16/2021   Cardiomegaly 04/16/2021   Chest pain 04/16/2021   Symptomatic anemia 01/25/2021   Morbid obesity 01/25/2021   Vaginal bleeding 01/25/2021   Iron deficiency anemia due to chronic blood loss 01/25/2021   Hypokalemia 01/25/2021   Facial  swelling 11/17/2006   Headache(784.0) 05/19/2005   Stress at work 11/17/2003   Urinary tract infection with hematuria 10/16/1997   Knee pain 06/16/1996   Sinusitis 02/17/1996   Back pain 10/16/1992   Past Medical History:  Diagnosis Date   Allergy    Anemia    iron defienency   Anxiety    Arthritis    CHF (congestive heart failure) (HCC)    GERD (gastroesophageal reflux disease)    Hyperlipidemia    Impaired fasting glucose    Metabolic syndrome    Obesity     Family History  Problem Relation Age of Onset   ALS Mother     Past Surgical History:  Procedure Laterality Date   CARDIOVERSION N/A 04/26/2021   Procedure: CARDIOVERSION;  Surgeon: Meriam Sprague, MD;  Location: [facility] ENDOSCOPY;  Service: Cardiovascular;  Laterality: N/A;   COLONOSCOPY  04-2004   KNEE ARTHROSCOPY     TEE WITHOUT CARDIOVERSION N/A 04/26/2021   Procedure: TRANSESOPHAGEAL ECHOCARDIOGRAM (TEE);  Surgeon: Meriam Sprague, MD;  Location: [facility] ENDOSCOPY;  Service: Cardiovascular;  Laterality: N/A;   Social History   Occupational History   Not on file  Tobacco Use   Smoking status: Former    Packs/day: 1.00    Years: 30.00    Additional pack years: 0.00    Total pack years: 30.00    Types: Cigarettes    Quit date: 2013    Years since quitting: 11.3   Smokeless tobacco: Never  Vaping Use   Vaping Use: Never used  Substance and Sexual Activity   Alcohol use: Never   Drug use: Never   Sexual activity: Not Currently

## 2022-08-19 ENCOUNTER — Ambulatory Visit
Admission: RE | Admit: 2022-08-19 | Discharge: 2022-08-19 | Disposition: A | Discharge status: Home / Self Care | Payer: Medicaid Other | Source: Ambulatory Visit | Attending: Physician Assistant | Admitting: Physician Assistant

## 2022-08-19 DIAGNOSIS — Z1231 Encounter for screening mammogram for malignant neoplasm of breast: Secondary | ICD-10-CM

## 2022-08-24 ENCOUNTER — Other Ambulatory Visit: Payer: Self-pay | Admitting: Physician Assistant

## 2022-08-24 DIAGNOSIS — R928 Other abnormal and inconclusive findings on diagnostic imaging of breast: Secondary | ICD-10-CM

## 2022-09-02 ENCOUNTER — Other Ambulatory Visit: Payer: Self-pay | Admitting: Adult Health

## 2022-09-02 DIAGNOSIS — I480 Paroxysmal atrial fibrillation: Secondary | ICD-10-CM

## 2022-09-02 DIAGNOSIS — I5043 Acute on chronic combined systolic (congestive) and diastolic (congestive) heart failure: Secondary | ICD-10-CM

## 2022-09-02 DIAGNOSIS — D500 Iron deficiency anemia secondary to blood loss (chronic): Secondary | ICD-10-CM

## 2022-09-09 ENCOUNTER — Ambulatory Visit
Admission: RE | Admit: 2022-09-09 | Discharge: 2022-09-09 | Disposition: A | Discharge status: Home / Self Care | Payer: Medicaid Other | Source: Ambulatory Visit | Attending: Physician Assistant | Admitting: Physician Assistant

## 2022-09-09 DIAGNOSIS — R928 Other abnormal and inconclusive findings on diagnostic imaging of breast: Secondary | ICD-10-CM

## 2022-09-13 ENCOUNTER — Other Ambulatory Visit: Payer: Self-pay | Admitting: Physician Assistant

## 2022-09-13 DIAGNOSIS — N6310 Unspecified lump in the right breast, unspecified quadrant: Secondary | ICD-10-CM

## 2022-09-19 ENCOUNTER — Other Ambulatory Visit: Payer: Self-pay

## 2022-09-19 ENCOUNTER — Telehealth: Payer: Self-pay | Admitting: Internal Medicine

## 2022-09-19 DIAGNOSIS — D500 Iron deficiency anemia secondary to blood loss (chronic): Secondary | ICD-10-CM

## 2022-09-19 DIAGNOSIS — I480 Paroxysmal atrial fibrillation: Secondary | ICD-10-CM

## 2022-09-19 DIAGNOSIS — I5043 Acute on chronic combined systolic (congestive) and diastolic (congestive) heart failure: Secondary | ICD-10-CM

## 2022-09-19 MED ORDER — BUMETANIDE 1 MG PO TABS: ORAL_TABLET | ORAL | 1 refills | Status: DC

## 2022-09-19 MED ORDER — AMIODARONE HCL 200 MG PO TABS: 200.0000 mg | ORAL_TABLET | Freq: Every day | ORAL | 1 refills | Status: DC

## 2022-09-19 MED ORDER — METOPROLOL SUCCINATE ER 25 MG PO TB24: 12.5000 mg | ORAL_TABLET | Freq: Every day | ORAL | 1 refills | Status: DC

## 2022-09-19 MED ORDER — PRAVASTATIN SODIUM 20 MG PO TABS: 20.0000 mg | ORAL_TABLET | Freq: Every evening | ORAL | 1 refills | Status: DC

## 2022-09-19 MED ORDER — APIXABAN 5 MG PO TABS: ORAL_TABLET | ORAL | 1 refills | Status: DC

## 2022-09-19 NOTE — Telephone Encounter (Signed)
   [facility] Pre-operative Risk Assessment    Request for surgical clearance:  What type of surgery is being performed?   Colonoscopy   When is this surgery scheduled?  09/23/22   What type of clearance is required (medical clearance vs. Pharmacy clearance to hold med vs. Both)?  Both   Are there any medications that need to be held prior to surgery and how long? Eliquis, 48 hours prior    Practice name and name of physician performing surgery?  [facility]  Dr. Briant Sites   What is your office phone number? [phone]   7.   What is your office fax number? [phone]  8.   Anesthesia type (None, local, MAC, general)?  Propofol    Amber Herring 09/19/2022, 2:19 PM

## 2022-09-19 NOTE — Telephone Encounter (Signed)
Pt calling requesting her medications be resent to a different pharmacy. Pt's medications were resent to the pharmacy requested. Confirmation received.

## 2022-09-20 ENCOUNTER — Telehealth: Payer: Self-pay

## 2022-09-20 NOTE — Telephone Encounter (Signed)
   Name: Amber Herring  DOB: Jan 14, 1961  MRN: [id number]  Primary Cardiologist: Chrystie Nose, MD   Preoperative team, please contact this patient and set up a phone call appointment for further preoperative risk assessment. Please obtain consent and complete medication review. Thank you for your help.  I confirm that guidance regarding antiplatelet and oral anticoagulation therapy has been completed and, if necessary, noted below. Per pharm D: [name] for anticoagulation.     Procedure: colonoscopy Date of procedure: 09/23/22   CHA2DS2-VASc Score = 3  This indicates a 3.2% annual risk of stroke. The patient's score is based upon: CHF History: 1 HTN History: 1 Diabetes History: 0 Stroke History: 0 Vascular Disease History: 0 Age Score: 0 Gender Score: 1   CrCl >180mL/min Platelet count 298K   Per office protocol, patient can hold Eliquis for 2 days prior to procedure as requested.        Carlos Levering, NP 09/20/2022, 12:12 PM [facility]

## 2022-09-20 NOTE — Telephone Encounter (Signed)
Spoke with patient who is agreeable to do a tele visit on 6/5 at 1:40 pm. Med rec and consent given.

## 2022-09-20 NOTE — Telephone Encounter (Signed)
Please advise holding Eliquis prior to colonoscopy.  Thank you!  DW  

## 2022-09-20 NOTE — Telephone Encounter (Signed)
  Patient Consent for Virtual Visit        Amber Herring has provided verbal consent on 09/20/2022 for a virtual visit (video or telephone).   CONSENT FOR VIRTUAL VISIT FOR:  Amber Herring  By participating in this virtual visit I agree to the following:  I hereby voluntarily request, consent and authorize [facility] and its employed or contracted physicians, physician assistants, nurse practitioners or other licensed health care professionals (the Practitioner), to provide me with telemedicine health care services (the "Services") as deemed necessary by the treating Practitioner. I acknowledge and consent to receive the Services by the Practitioner via telemedicine. I understand that the telemedicine visit will involve communicating with the Practitioner through live audiovisual communication technology and the disclosure of certain medical information by electronic transmission. I acknowledge that I have been given the opportunity to request an in-person assessment or other available alternative prior to the telemedicine visit and am voluntarily participating in the telemedicine visit.  I understand that I have the right to withhold or withdraw my consent to the use of telemedicine in the course of my care at any time, without affecting my right to future care or treatment, and that the Practitioner or I may terminate the telemedicine visit at any time. I understand that I have the right to inspect all information obtained and/or recorded in the course of the telemedicine visit and may receive copies of available information for a reasonable fee.  I understand that some of the potential risks of receiving the Services via telemedicine include:  Delay or interruption in medical evaluation due to technological equipment failure or disruption; Information transmitted may not be sufficient (e.g. poor resolution of images) to allow for appropriate medical decision making by the  Practitioner; and/or  In rare instances, security protocols could fail, causing a breach of personal health information.  Furthermore, I acknowledge that it is my responsibility to provide information about my medical history, conditions and care that is complete and accurate to the best of my ability. I acknowledge that Practitioner's advice, recommendations, and/or decision may be based on factors not within their control, such as incomplete or inaccurate data provided by me or distortions of diagnostic images or specimens that may result from electronic transmissions. I understand that the practice of medicine is not an exact science and that Practitioner makes no warranties or guarantees regarding treatment outcomes. I acknowledge that a copy of this consent can be made available to me via my patient portal [facility] MyChart), or I can request a printed copy by calling the office of [facility].    I understand that my insurance will be billed for this visit.   I have read or had this consent read to me. I understand the contents of this consent, which adequately explains the benefits and risks of the Services being provided via telemedicine.  I have been provided ample opportunity to ask questions regarding this consent and the Services and have had my questions answered to my satisfaction. I give my informed consent for the services to be provided through the use of telemedicine in my medical care

## 2022-09-20 NOTE — Telephone Encounter (Signed)
[name] for anticoagulation.    Procedure: colonoscopy Date of procedure: 09/23/22  CHA2DS2-VASc Score = 3  This indicates a 3.2% annual risk of stroke. The patient's score is based upon: CHF History: 1 HTN History: 1 Diabetes History: 0 Stroke History: 0 Vascular Disease History: 0 Age Score: 0 Gender Score: 1   CrCl >128mL/min Platelet count 298K  Per office protocol, patient can hold Eliquis for 2 days prior to procedure as requested.    **This guidance is not considered finalized until pre-operative APP has relayed final recommendations.**

## 2022-09-21 ENCOUNTER — Other Ambulatory Visit: Payer: Self-pay | Admitting: Adult Health

## 2022-09-21 ENCOUNTER — Ambulatory Visit: Payer: Medicaid Other | Attending: Cardiovascular Disease | Admitting: Student

## 2022-09-21 DIAGNOSIS — I5043 Acute on chronic combined systolic (congestive) and diastolic (congestive) heart failure: Secondary | ICD-10-CM

## 2022-09-21 DIAGNOSIS — Z01810 Encounter for preprocedural cardiovascular examination: Secondary | ICD-10-CM | POA: Diagnosis not present

## 2022-09-21 DIAGNOSIS — I480 Paroxysmal atrial fibrillation: Secondary | ICD-10-CM

## 2022-09-21 DIAGNOSIS — D500 Iron deficiency anemia secondary to blood loss (chronic): Secondary | ICD-10-CM

## 2022-09-21 NOTE — Progress Notes (Signed)
Virtual Visit via Telephone Note   Because of Amber Herring's co-morbid illnesses, she is at least at moderate risk for complications without adequate follow up.  This format is felt to be most appropriate for this patient at this time.  The patient did not have access to video technology/had technical difficulties with video requiring transitioning to audio format only (telephone).  All issues noted in this document were discussed and addressed.  No physical exam could be performed with this format.  Please refer to the patient's chart for her consent to telehealth for [facility].  Evaluation Performed:  Preoperative cardiovascular risk assessment _____________   Date:  09/21/2022   Patient ID:  Amber Herring, DOB 07-Sep-1960, MRN [id number] Patient Location:  Home Provider location:   Office  Primary Care Provider:  Quita Skye, PA-C Primary Cardiologist:  Chrystie Nose, MD  Chief Complaint / Patient Profile   62 y.o. y/o female with a h/o chronic systolic heart failure, PAF s/p DCCV on anticoagulation, hypertension, hyperlipidemia, iron deficiency anemia, GERD who is pending colonoscopy by Dr. Nadara Mustard and presents today for telephonic preoperative cardiovascular risk assessment.  History of Present Illness    Amber Herring is a 61 y.o. female who presents via audio/video conferencing for a telehealth visit today.  Pt was last seen in cardiology clinic on 04/29/2022 by Bernadene Person, NP.  At that time Amber Herring was doing well.  The patient is now pending procedure as outlined above. Since her last visit, she is doing well from a cardiac standpoint. Patient denies shortness of breath or dyspnea on exertion. No chest pain, pressure, or tightness. She reports chronic lower extremity edema that is managed with bumex. It is typically at its best in the morning and progressively worsens throughout the day. Unfortunately she is not very active. She does not walk more  than 15 steps secondary to bilateral knee pain. She states she is "bone on bone." When she is home she gets around on a chair with wheels. Per the DASI she can only achieve  3.84 METs. She will need a stress test for further evaluation.   Past Medical History    Past Medical History:  Diagnosis Date   Allergy    Anemia    iron defienency   Anxiety    Arthritis    CHF (congestive heart failure) (HCC)    GERD (gastroesophageal reflux disease)    Hyperlipidemia    Impaired fasting glucose    Metabolic syndrome    Obesity    Past Surgical History:  Procedure Laterality Date   CARDIOVERSION N/A 04/26/2021   Procedure: CARDIOVERSION;  Surgeon: Meriam Sprague, MD;  Location: [facility] ENDOSCOPY;  Service: Cardiovascular;  Laterality: N/A;   COLONOSCOPY  04-2004   KNEE ARTHROSCOPY     TEE WITHOUT CARDIOVERSION N/A 04/26/2021   Procedure: TRANSESOPHAGEAL ECHOCARDIOGRAM (TEE);  Surgeon: Meriam Sprague, MD;  Location: [facility] ENDOSCOPY;  Service: Cardiovascular;  Laterality: N/A;    Allergies  No Known Allergies  Home Medications    Prior to Admission medications   Medication Sig Start Date End Date Taking? Authorizing Provider  acetaminophen (TYLENOL) 500 MG tablet Take 1 tablet (500 mg total) by mouth every 6 (six) hours as needed. 03/04/22   Redwine, Madison A, PA-C  amiodarone (PACERONE) 200 MG tablet Take 1 tablet (200 mg total) by mouth daily. 09/19/22   Hilty, Lisette Abu, MD  apixaban (ELIQUIS) 5 MG TABS tablet TAKE 1 TABLET(5 MG) BY  MOUTH TWICE DAILY 09/19/22   Hilty, Lisette Abu, MD  APPLE CIDER VINEGAR PO Take 1 tablet by mouth every morning. gummies    [provider]  bacitracin ointment Apply 1 Application topically 2 (two) times daily. 03/04/22   Redwine, Madison A, PA-C  bumetanide (BUMEX) 1 MG tablet TAKE 1 TABLET(1 MG) BY MOUTH TWICE DAILY 09/19/22   Hilty, Lisette Abu, MD  capsaicin (ZOSTRIX) 0.025 % cream Apply topically 2 (two) times daily. 05/06/21   Rodolph Bong,  MD  diclofenac Sodium (VOLTAREN) 1 % GEL Apply 2 g topically 4 (four) times daily. 02/11/22   Jodelle Gross, NP  EPINEPHrine (PRIMATENE MIST) 0.125 MG/ACT AERO Inhale 1 puff into the lungs daily as needed (wheezing).    [provider]  ferrous sulfate 325 (65 FE) MG tablet Take 1 tablet (325 mg total) by mouth 3 (three) times daily with meals. 05/06/21   Rodolph Bong, MD  HYDROcodone-acetaminophen (NORCO/VICODIN) 5-325 MG tablet Take 1 tablet by mouth 3 (three) times daily as needed. 03/04/22   Redwine, Madison A, PA-C  loratadine (CLARITIN) 10 MG tablet Take 1 tablet (10 mg total) by mouth daily as needed for allergies. 05/06/21   Rodolph Bong, MD  megestrol (MEGACE) 40 MG tablet Take 1 tablet (40 mg total) by mouth 2 (two) times daily. 05/06/21   Rodolph Bong, MD  metoprolol succinate (TOPROL-XL) 25 MG 24 hr tablet Take 0.5 tablets (12.5 mg total) by mouth daily. 09/19/22   Hilty, Lisette Abu, MD  Multiple Vitamins-Minerals (ADULT ONE DAILY GUMMIES) CHEW Chew 2 tablets by mouth every morning.    [provider]  OVER THE COUNTER MEDICATION Take 1 capsule by mouth 2 (two) times daily. Beet root capsule    [provider]  OVER THE COUNTER MEDICATION Take 2 tablets by mouth every morning. Omega XL - Web designer, Historical, MD  OVER THE COUNTER MEDICATION Apply 1 application topically 3 (three) times daily as needed (knee pain). Otc foam for knee pain    [provider]  pantoprazole (PROTONIX) 40 MG tablet Take 1 tablet (40 mg total) by mouth daily. 05/07/21   Rodolph Bong, MD  polyethylene glycol (MIRALAX / GLYCOLAX) 17 g packet Take 17 g by mouth 2 (two) times daily. 05/06/21   Rodolph Bong, MD  pravastatin (PRAVACHOL) 20 MG tablet Take 1 tablet (20 mg total) by mouth every evening. 09/19/22   Hilty, Lisette Abu, MD  senna-docusate (SENOKOT-S) 8.6-50 MG tablet Take 1 tablet by mouth 2 (two) times daily. 05/06/21   Rodolph Bong, MD  sodium chloride (OCEAN) 0.65 % SOLN nasal spray Place 1 spray into both nostrils as needed for congestion (nose irritation). 05/06/21   Rodolph Bong, MD  traMADol (ULTRAM) 50 MG tablet Take 1 tablet (50 mg total) by mouth every 6 (six) hours as needed for moderate pain. 05/06/21   Rodolph Bong, MD    Physical Exam    Vital Signs:  Amber Herring does not have vital signs available for review today.  Given telephonic nature of communication, physical exam is limited. AAOx3. NAD. Normal affect.  Speech and respirations are unlabored.  Accessory Clinical Findings    None  Assessment & Plan    Primary Cardiologist: Chrystie Nose, MD  Preoperative cardiovascular risk assessment.  Colonoscopy with Dr. Nadara Mustard.  Chart reviewed as part of pre-operative protocol coverage. According to the RCRI, patient has a 0.9% risk  of MACE. Patient reports activity equivalent to 3.84 METS (per DASI).   As patient cannot achieve 4.0 METS she will need to undergo stress testing for further evaluation.   I will route this recommendation to the pre-op team to get her scheduled for a nuclear stress test.   Shared Decision Making/Informed Consent The risks [chest pain, shortness of breath, cardiac arrhythmias, dizziness, blood pressure fluctuations, myocardial infarction, stroke/transient ischemic attack, nausea, vomiting, allergic reaction, radiation exposure, metallic taste sensation and life-threatening complications (estimated to be 1 in 10,000)], benefits (risk stratification, diagnosing coronary artery disease, treatment guidance) and alternatives of a nuclear stress test were discussed in detail with Ms. Mcgibbon and she agrees to proceed.   Please call with questions.  Time:   Today, I have spent 10 minutes with the patient with telehealth technology discussing medical history, symptoms, and management plan.     Carlos Levering, NP  09/21/2022, 9:40 AM

## 2022-09-22 NOTE — Telephone Encounter (Signed)
Pt needs lexiscan stress before being cleared. Stress test 10/07/22. Once the pt has been cleared our office we will fax clearance notes to requesting office.

## 2022-09-29 ENCOUNTER — Other Ambulatory Visit: Payer: Medicaid Other

## 2022-09-30 ENCOUNTER — Ambulatory Visit
Admission: RE | Admit: 2022-09-30 | Discharge: 2022-09-30 | Disposition: A | Discharge status: Home / Self Care | Payer: Medicaid Other | Source: Ambulatory Visit | Attending: Physician Assistant | Admitting: Physician Assistant

## 2022-09-30 DIAGNOSIS — N6310 Unspecified lump in the right breast, unspecified quadrant: Secondary | ICD-10-CM

## 2022-09-30 HISTORY — PX: BREAST BIOPSY: SHX20

## 2022-10-03 ENCOUNTER — Telehealth: Payer: Self-pay | Admitting: Orthopedic Surgery

## 2022-10-03 NOTE — Telephone Encounter (Signed)
Patient has Medicaid and they do not cover gel injection. Any other options? Also please advise on w/c. Last seen in April.

## 2022-10-03 NOTE — Telephone Encounter (Signed)
Patient called asked if she can get approved for the gel injection.   Patient also asked if she can get a wheelchair through medicaid?    The number to contact patient is [phone]

## 2022-10-03 NOTE — Telephone Encounter (Signed)
We can repeat cortisone injections on 7/19. Okay for wheelchair from my point of view given her severe knee OA

## 2022-10-04 ENCOUNTER — Other Ambulatory Visit: Payer: Self-pay

## 2022-10-04 ENCOUNTER — Telehealth: Payer: Self-pay

## 2022-10-04 DIAGNOSIS — I5023 Acute on chronic systolic (congestive) heart failure: Secondary | ICD-10-CM

## 2022-10-04 NOTE — Telephone Encounter (Signed)
Amber Levering, Amber Herring  Dorris Fetch, CMA  ------Will you please contact this patient. I had originally ordered a myoview stress test for her through a pre-op call. It was denied by insurance. After discussing with Dr. Rennis Golden, I would like to order an echo for systolic heart failure. Will you please place the order and contact the patient to let her know instead of the stress test we will do an echo prior to clearing her for colonoscopy?-------  Called patient and informed her of the order for Echocardiogram and that someone will be giving her a call to get scheduled for the test.

## 2022-10-05 NOTE — Telephone Encounter (Signed)
Talked with patient concerning the price for TriVisc.  Per patient she would like to proceed.  Advised patient that she will receive a call from [employer] to pay for medication and then the medication will be shipped to our office.  Patient voiced that she understands.   Submitted for TriVisc, bilateral knee.

## 2022-10-07 ENCOUNTER — Ambulatory Visit (HOSPITAL_COMMUNITY): Payer: Medicaid Other

## 2022-10-13 ENCOUNTER — Telehealth: Payer: Self-pay | Admitting: Orthopedic Surgery

## 2022-10-13 NOTE — Telephone Encounter (Signed)
Patient called in wondering if her injections were mailed here yet please advise there is no notes on a updated status as of now, please advise

## 2022-10-14 ENCOUNTER — Ambulatory Visit (HOSPITAL_COMMUNITY): Payer: Medicaid Other

## 2022-10-28 ENCOUNTER — Telehealth: Payer: Self-pay | Admitting: Cardiology

## 2022-10-28 NOTE — Telephone Encounter (Signed)
Patient is calling because she needs a sleep compliance appointment. She states that she has been using her CPAP for about 4 months now. Will send message to scheduler to arrange.

## 2022-10-28 NOTE — Telephone Encounter (Signed)
Patient would like a call back to discuss her CPAP.

## 2022-11-04 ENCOUNTER — Ambulatory Visit (HOSPITAL_COMMUNITY): Payer: Medicaid Other | Attending: Internal Medicine

## 2022-11-04 DIAGNOSIS — I5023 Acute on chronic systolic (congestive) heart failure: Secondary | ICD-10-CM | POA: Diagnosis not present

## 2022-11-04 LAB — ECHOCARDIOGRAM COMPLETE
Area-P 1/2: 3.17 cm2
Calc EF: 70.4 %
S' Lateral: 3.1 cm
Single Plane A2C EF: 70.8 %
Single Plane A4C EF: 69.7 %

## 2022-11-04 MED ORDER — PERFLUTREN LIPID MICROSPHERE
1.0000 mL | INTRAVENOUS | Status: AC | PRN
Start: 2022-11-04 — End: 2022-11-04
  Administered 2022-11-04: 4 mL via INTRAVENOUS

## 2022-11-04 NOTE — Telephone Encounter (Signed)
   Patient Name: Amber Herring  DOB: 03/17/1961 MRN: [id number]  Primary Cardiologist: Chrystie Nose, MD  Chart reviewed as part of pre-operative protocol coverage. Pt was seen virtually on 09/21/2022 for preoperative telephone visit. Surgical clearance was pending echocardiogram results. Recent echo was normal. Therefore, based on ACC/AHA guidelines, patient would be at acceptable risk for the planned procedure without further cardiovascular testing.  Per office protocol, patient can hold Eliquis for 2 days prior to procedure as requested. Please resume Eliquis as soon as possible postprocedure, at the discretion of the surgeon.   I will route this recommendation to the requesting party via Epic fax function and remove from pre-op pool.  Please call with questions.  Joylene Grapes, NP 11/04/2022, 2:27 PM

## 2022-11-09 ENCOUNTER — Ambulatory Visit (INDEPENDENT_AMBULATORY_CARE_PROVIDER_SITE_OTHER): Payer: Medicaid Other | Admitting: Surgical

## 2022-11-09 DIAGNOSIS — M170 Bilateral primary osteoarthritis of knee: Secondary | ICD-10-CM

## 2022-11-11 ENCOUNTER — Encounter: Payer: Self-pay | Admitting: Surgical

## 2022-11-11 MED ORDER — LIDOCAINE HCL 1 % IJ SOLN
5.0000 mL | INTRAMUSCULAR | Status: AC | PRN
Start: 2022-11-09 — End: 2022-11-09
  Administered 2022-11-09: 5 mL

## 2022-11-11 MED ORDER — SODIUM HYALURONATE (VISCOSUP) 25 MG/2.5ML IX SOSY
25.0000 mg | PREFILLED_SYRINGE | INTRA_ARTICULAR | Status: AC | PRN
Start: 2022-11-09 — End: 2022-11-09
  Administered 2022-11-09: 25 mg via INTRA_ARTICULAR

## 2022-11-11 NOTE — Progress Notes (Signed)
   Procedure Note  Patient: NIL ANSTETT             Date of Birth: 1960/07/05           MRN: [id number]             Visit Date: 11/09/2022  Procedures: Visit Diagnoses: No diagnosis found.  Large Joint Inj: bilateral knee on 11/09/2022 9:18 AM Indications: diagnostic evaluation, joint swelling and pain Details: 18 G 1.5 in needle, superolateral approach  Arthrogram: No  Medications (Right): 5 mL lidocaine 1 %; 25 mg Sodium Hyaluronate (Viscosup) 25 MG/2.5ML Medications (Left): 5 mL lidocaine 1 %; 25 mg Sodium Hyaluronate (Viscosup) 25 MG/2.5ML Outcome: tolerated well, no immediate complications Procedure, treatment alternatives, risks and benefits explained, specific risks discussed. Consent was given by the patient. Immediately prior to procedure a time out was called to verify the correct patient, procedure, equipment, support staff and site/side marked as required. Patient was prepped and draped in the usual sterile fashion.

## 2022-11-16 ENCOUNTER — Ambulatory Visit (INDEPENDENT_AMBULATORY_CARE_PROVIDER_SITE_OTHER): Payer: Medicaid Other | Admitting: Orthopedic Surgery

## 2022-11-16 DIAGNOSIS — M170 Bilateral primary osteoarthritis of knee: Secondary | ICD-10-CM | POA: Diagnosis not present

## 2022-11-17 ENCOUNTER — Encounter: Payer: Self-pay | Admitting: Orthopedic Surgery

## 2022-11-17 MED ORDER — SODIUM HYALURONATE (VISCOSUP) 25 MG/2.5ML IX SOSY
25.0000 mg | PREFILLED_SYRINGE | INTRA_ARTICULAR | Status: AC | PRN
Start: 2022-11-16 — End: 2022-11-16
  Administered 2022-11-16: 25 mg via INTRA_ARTICULAR

## 2022-11-17 MED ORDER — LIDOCAINE HCL 1 % IJ SOLN
5.0000 mL | INTRAMUSCULAR | Status: AC | PRN
Start: 2022-11-16 — End: 2022-11-16
  Administered 2022-11-16: 5 mL

## 2022-11-17 NOTE — Progress Notes (Signed)
   Procedure Note  Patient: Amber Herring             Date of Birth: 07/08/1960           MRN: [id number]             Visit Date: 11/16/2022  Procedures: Visit Diagnoses:  1. Bilateral primary osteoarthritis of knee     Large Joint Inj: R knee on 11/16/2022 6:59 AM Indications: diagnostic evaluation, joint swelling and pain Details: 18 G 1.5 in needle, superolateral approach  Arthrogram: No  Medications: 5 mL lidocaine 1 %; 25 mg Sodium Hyaluronate (Viscosup) 25 MG/2.5ML Outcome: tolerated well, no immediate complications Procedure, treatment alternatives, risks and benefits explained, specific risks discussed. Consent was given by the patient. Immediately prior to procedure a time out was called to verify the correct patient, procedure, equipment, support staff and site/side marked as required. Patient was prepped and draped in the usual sterile fashion.    Large Joint Inj: L knee on 11/16/2022 7:00 AM Indications: diagnostic evaluation, joint swelling and pain Details: 18 G 1.5 in needle, superolateral approach  Arthrogram: No  Medications: 5 mL lidocaine 1 %; 25 mg Sodium Hyaluronate (Viscosup) 25 MG/2.5ML Outcome: tolerated well, no immediate complications Procedure, treatment alternatives, risks and benefits explained, specific risks discussed. Consent was given by the patient. Immediately prior to procedure a time out was called to verify the correct patient, procedure, equipment, support staff and site/side marked as required. Patient was prepped and draped in the usual sterile fashion.

## 2022-11-18 IMAGING — DX DG KNEE 1-2V*L*
2 series · 2 of 2 positions shown · non-contrast
Comparison: None.

CLINICAL DATA: Chronic bilateral anterior knee pain

EXAM:
RIGHT KNEE - 1-2 VIEW; LEFT KNEE - 1-2 VIEW

[knee ap]
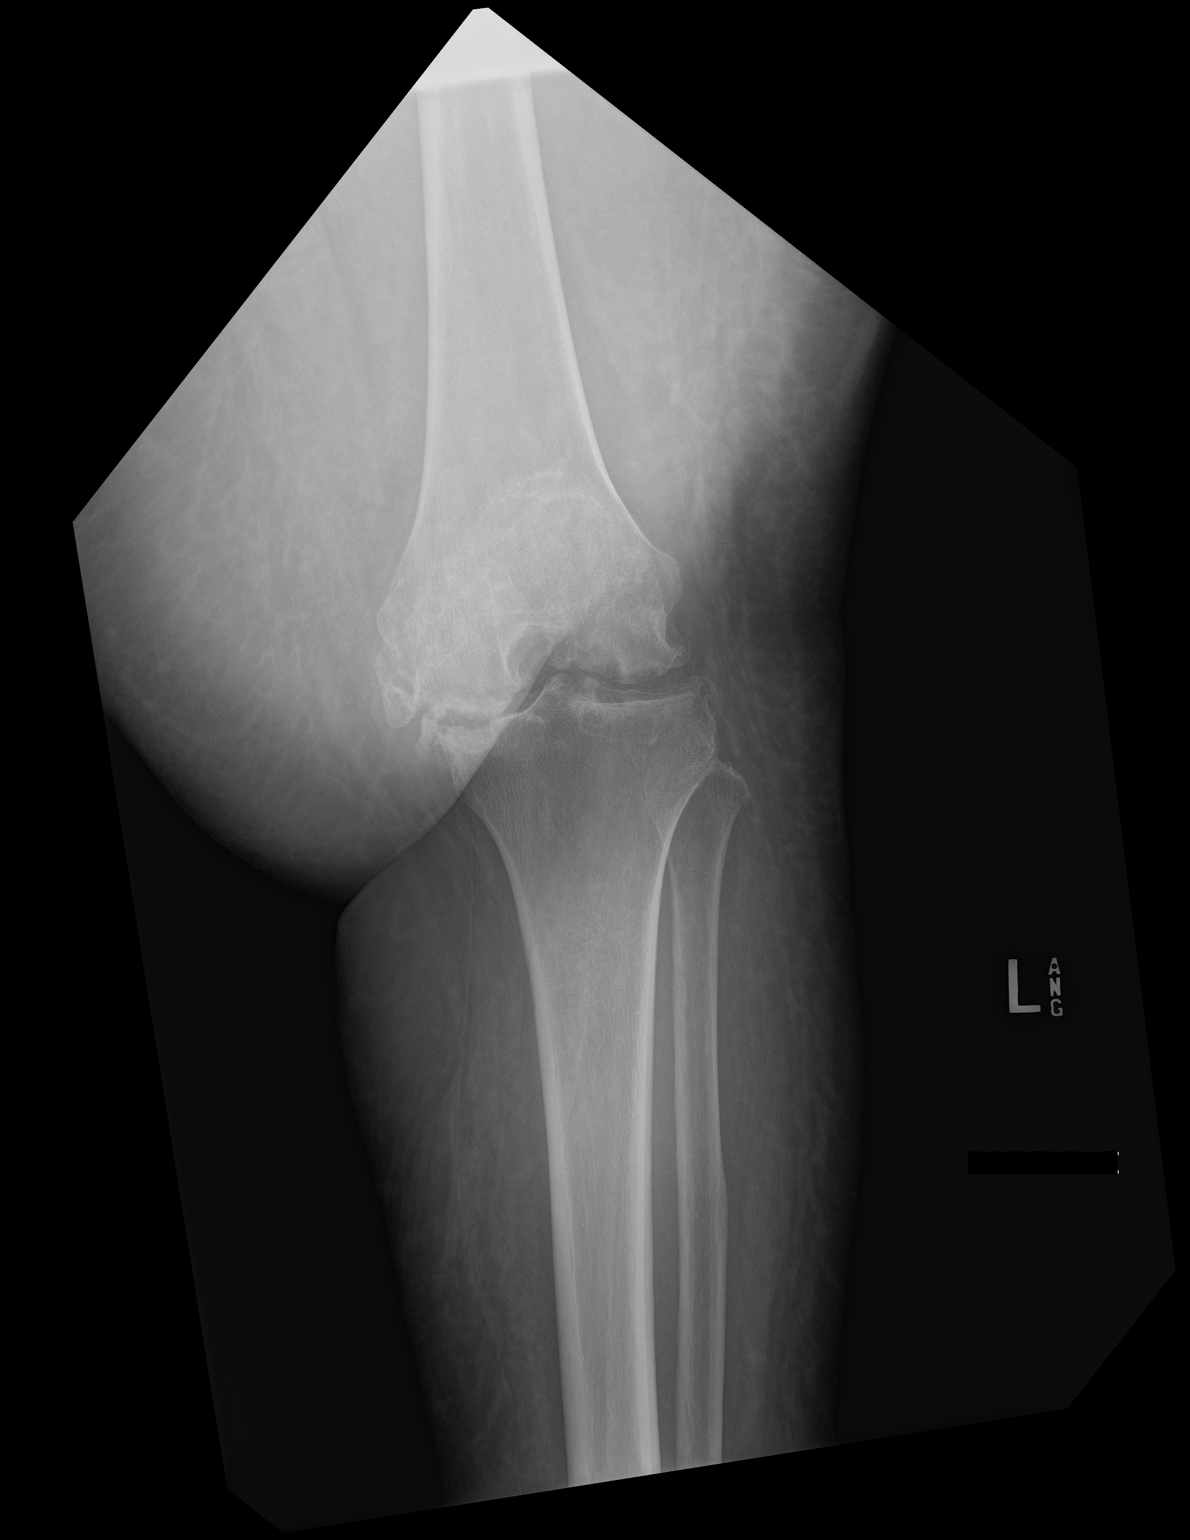

[knee lat]
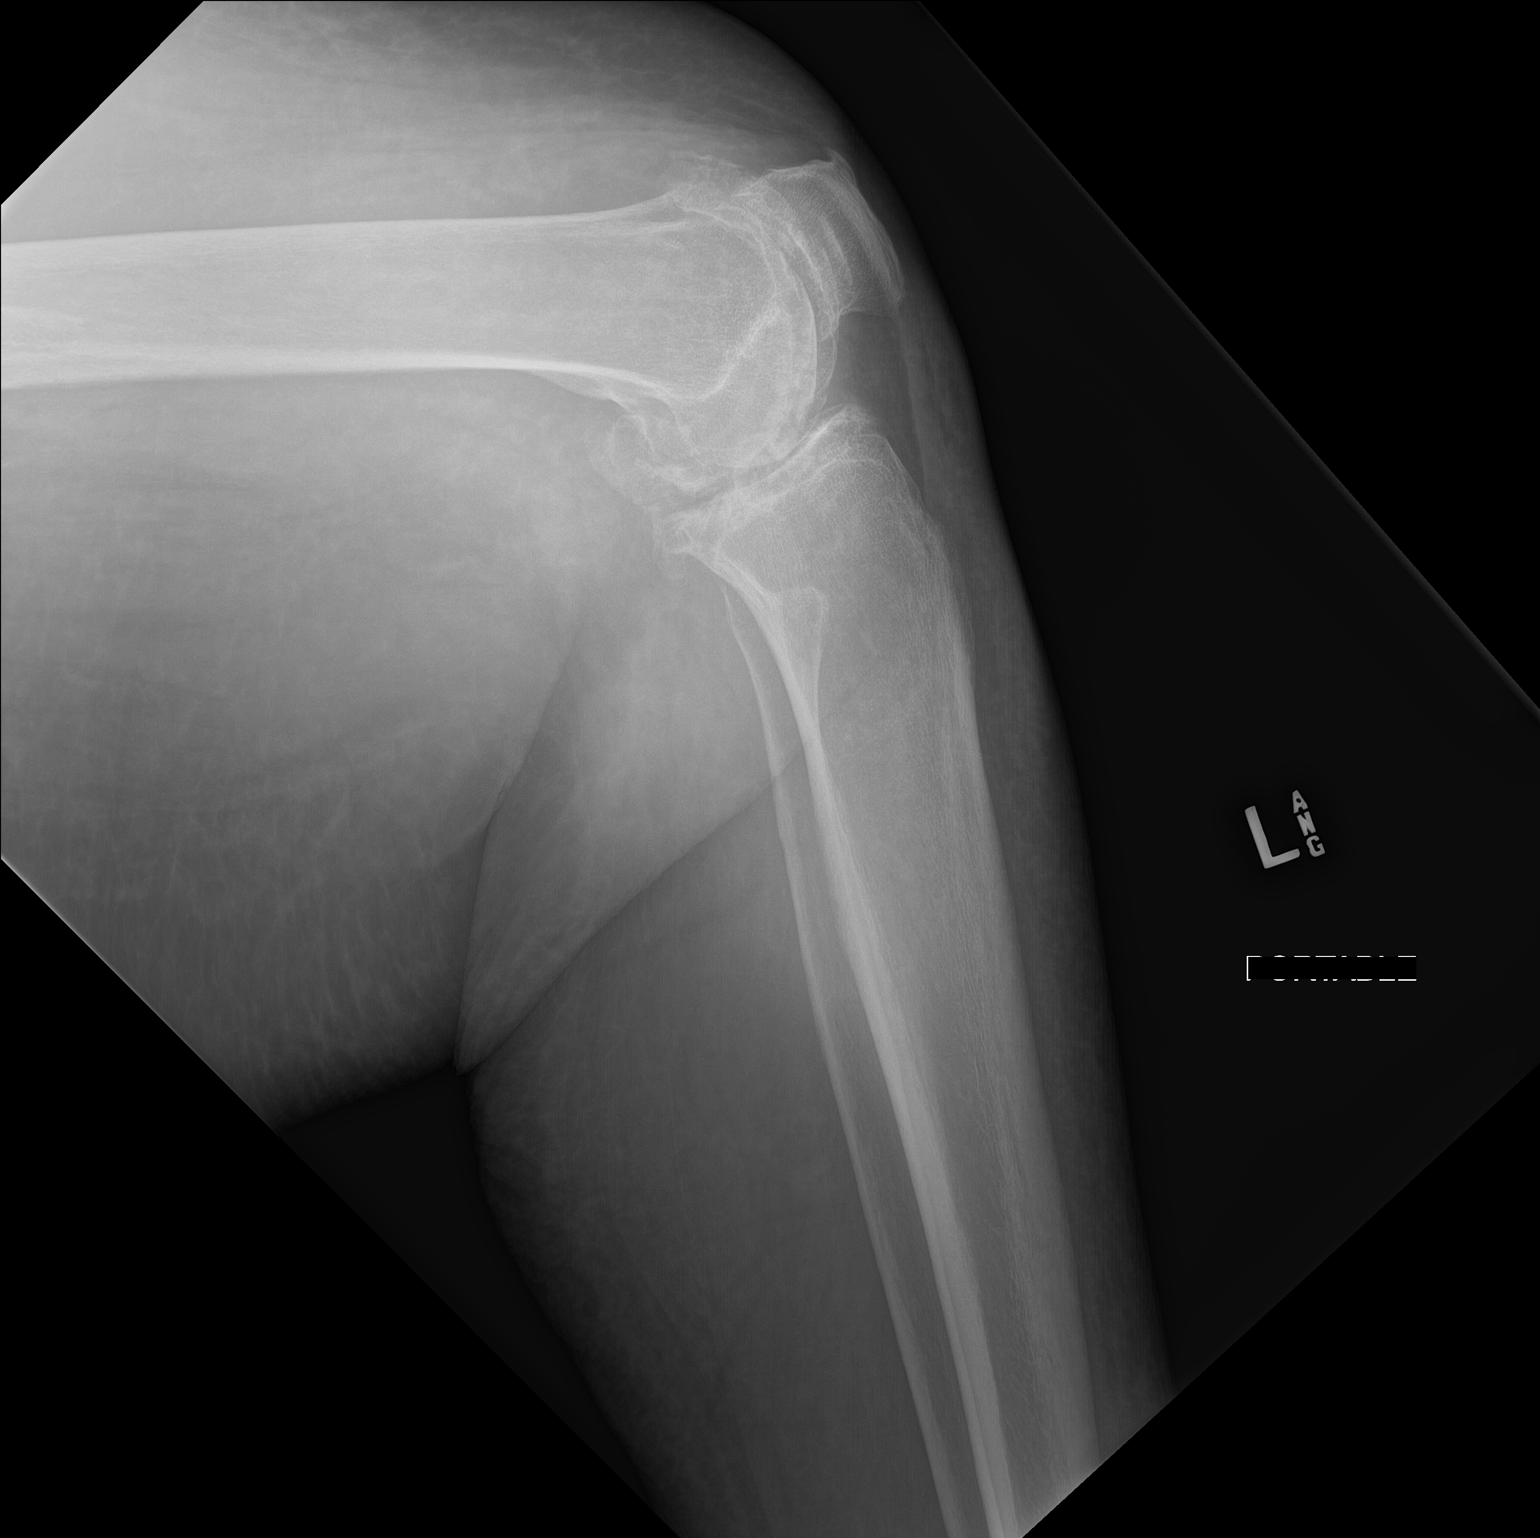

[2 of 2 positions shown; findings below may reference images not displayed]

FINDINGS: Left knee: Severe joint space loss, spurring, subchondral sclerosis
of the medial compartment. Lucency in the cortex of the medial
femoral condyle indicative of prior subchondral injury. Moderate
joint space loss and spurring of the patellofemoral compartment.
Mild spurring of the lateral compartment.

Right knee: Moderate joint space loss, spurring, subchondral cystic
changes seen in the medial compartment. Mild spurring of the lateral
compartment. Moderate spurring and joint space loss of the
patellofemoral compartment.
IMPRESSION: Bilateral knee osteoarthrosis, greatest involving the medial
compartment of the left knee.

## 2022-11-21 ENCOUNTER — Telehealth: Payer: Self-pay

## 2022-11-21 NOTE — Telephone Encounter (Signed)
-----   Message from Joni Reining sent at 11/20/2022  2:29 PM EDT ----- Regarding: FW: Re-referral to Weight management Will you please send this referral again as the patient can now participate?  Thank you, Samara Deist ----- Message ----- From: Luellen Pucker, RN Sent: 11/18/2022   6:43 PM EDT To: Jodelle Gross, NP Subject: Re-referral to Weight management               Hello, I was speaking with patient regarding an appointment change with Dr. Mayford Knife for sleep apnea and she mentioned that she had been referred to weight management but had not been able to previously follow up on it.  It looks like a referral went in 01/2022. She says she is now able to go to weight management and would like to re-referred. Alcario Drought, RN

## 2022-11-23 ENCOUNTER — Telehealth: Payer: Self-pay

## 2022-11-23 ENCOUNTER — Encounter: Payer: Self-pay | Admitting: Orthopedic Surgery

## 2022-11-23 ENCOUNTER — Ambulatory Visit: Payer: Medicaid Other | Admitting: Orthopedic Surgery

## 2022-11-23 ENCOUNTER — Ambulatory Visit: Payer: Medicaid Other | Admitting: Cardiology

## 2022-11-23 DIAGNOSIS — M170 Bilateral primary osteoarthritis of knee: Secondary | ICD-10-CM

## 2022-11-23 MED ORDER — LIDOCAINE HCL 1 % IJ SOLN
5.0000 mL | INTRAMUSCULAR | Status: AC | PRN
Start: 2022-11-23 — End: 2022-11-23
  Administered 2022-11-23: 5 mL

## 2022-11-23 MED ORDER — SODIUM HYALURONATE (VISCOSUP) 25 MG/2.5ML IX SOSY
25.0000 mg | PREFILLED_SYRINGE | INTRA_ARTICULAR | Status: AC | PRN
Start: 2022-11-23 — End: 2022-11-23
  Administered 2022-11-23: 25 mg via INTRA_ARTICULAR

## 2022-11-23 NOTE — Telephone Encounter (Signed)
Auth needed for repeat gel in 6 months

## 2022-11-23 NOTE — Telephone Encounter (Signed)
Noted.  Next available TriVisc injection will need to be after February 7,2025. Will submit online through [facility] in January, 2025.

## 2022-11-23 NOTE — Progress Notes (Signed)
   Procedure Note  Patient: Amber Herring             Date of Birth: [birth year]           MRN: [id number]             Visit Date: 11/23/2022  Procedures: Visit Diagnoses:  1. Bilateral primary osteoarthritis of knee     Large Joint Inj: R knee on 11/23/2022 9:37 AM Indications: diagnostic evaluation, joint swelling and pain Details: 18 G 1.5 in needle, superolateral approach  Arthrogram: No  Medications: 5 mL lidocaine 1 %; 25 mg Sodium Hyaluronate (Viscosup) 25 MG/2.5ML Outcome: tolerated well, no immediate complications Procedure, treatment alternatives, risks and benefits explained, specific risks discussed. Consent was given by the patient. Immediately prior to procedure a time out was called to verify the correct patient, procedure, equipment, support staff and site/side marked as required. Patient was prepped and draped in the usual sterile fashion.    Large Joint Inj: L knee on 11/23/2022 9:37 AM Indications: diagnostic evaluation, joint swelling and pain Details: 18 G 1.5 in needle, superolateral approach  Arthrogram: No  Medications: 5 mL lidocaine 1 %; 25 mg Sodium Hyaluronate (Viscosup) 25 MG/2.5ML Outcome: tolerated well, no immediate complications Procedure, treatment alternatives, risks and benefits explained, specific risks discussed. Consent was given by the patient. Immediately prior to procedure a time out was called to verify the correct patient, procedure, equipment, support staff and site/side marked as required. Patient was prepped and draped in the usual sterile fashion.     This patient is diagnosed with osteoarthritis of the knee(s).    Radiographs show evidence of joint space narrowing, osteophytes, subchondral sclerosis and/or subchondral cysts.  This patient has knee pain which interferes with functional and activities of daily living.    This patient has experienced inadequate response, adverse effects and/or intolerance with conservative treatments  such as acetaminophen, NSAIDS, topical creams, physical therapy or regular exercise, knee bracing and/or weight loss.   This patient has experienced inadequate response or has a contraindication to intra articular steroid injections for at least 3 months.   This patient is not scheduled to have a total knee replacement within 6 months of starting treatment with viscosupplementation.

## 2022-12-28 ENCOUNTER — Ambulatory Visit: Payer: Medicaid Other | Attending: Cardiology | Admitting: Cardiology

## 2022-12-28 ENCOUNTER — Encounter: Payer: Self-pay | Admitting: Cardiology

## 2022-12-28 ENCOUNTER — Telehealth: Payer: Self-pay | Admitting: *Deleted

## 2022-12-28 VITALS — BP 118/60 | HR 58 | Ht 70.0 in | Wt 384.0 lb

## 2022-12-28 DIAGNOSIS — G4733 Obstructive sleep apnea (adult) (pediatric): Secondary | ICD-10-CM

## 2022-12-28 DIAGNOSIS — I10 Essential (primary) hypertension: Secondary | ICD-10-CM

## 2022-12-28 DIAGNOSIS — I5022 Chronic systolic (congestive) heart failure: Secondary | ICD-10-CM

## 2022-12-28 NOTE — Telephone Encounter (Signed)
Order placed to [facility] via community message. 

## 2022-12-28 NOTE — Progress Notes (Signed)
Sleep Medicine CONSULT Note    Date:  12/28/2022   ID:  Amber Herring, DOB 22-Aug-1960, MRN [id number]  PCP:  Quita Skye, PA-C  Cardiologist: Chrystie Nose, MD   Chief Complaint  Patient presents with   New Patient (Initial Visit)    Obstructive sleep apnea    History of Present Illness:  Amber Herring is a 62 y.o. female who is being seen today for the evaluation of obstructive sleep apnea at the request of [place] Hilty, MD.  This is a 62 year old female with a history of CHF, hyperlipidemia, metabolic syndrome, obesity.  She was seen in January 2024 and complained of excessive daytime sleepiness with an Epworth Sleepiness Scale of 5 and a STOP-BANG score of 7. She was sleeping poorly at night waking up multiple times nightly.  She was seen by Bernadene Person, NP.  She underwent home sleep study showing severe obstructive sleep apnea with an AHI 47.4/h and nocturnal hypoxemia.  Recommendations were to perform an in lab CPAP titration but patient said she was unable to sleep in the sleep lab so she was started on auto CPAP from 4 to 15 cm H2O.  She is now referred for sleep medicine consultation to establish sleep care and treatment of his obstructive sleep apnea  She is doing well with his PAP device and thinks that she has gotten used to it.  She tolerates the full face mask but the air blows into her eyes and causes her to have a sinus HA. She feels the pressure is adequate.  Since going on PAP She feels rested in the am and has no significant daytime sleepiness.  She denies any significant mouth or nasal dryness or nasal congestion.  She does not think that he snores.    Past Medical History:  Diagnosis Date   Allergy    Anemia    iron defienency   Anxiety    Arthritis    CHF (congestive heart failure) (HCC)    GERD (gastroesophageal reflux disease)    Hyperlipidemia    Impaired fasting glucose    Metabolic syndrome    Obesity     Past Surgical History:  Procedure  Laterality Date   BREAST BIOPSY Right 09/30/2022   [place] RT BREAST BX W LOC DEV 1ST LESION IMG BX SPEC US GUIDE 09/30/2022 [facility] MAMMOGRAPHY   CARDIOVERSION N/A 04/26/2021   Procedure: CARDIOVERSION;  Surgeon: Meriam Sprague, MD;  Location: [facility] ENDOSCOPY;  Service: Cardiovascular;  Laterality: N/A;   COLONOSCOPY  04-2004   KNEE ARTHROSCOPY     TEE WITHOUT CARDIOVERSION N/A 04/26/2021   Procedure: TRANSESOPHAGEAL ECHOCARDIOGRAM (TEE);  Surgeon: Meriam Sprague, MD;  Location: [facility] ENDOSCOPY;  Service: Cardiovascular;  Laterality: N/A;    Current Medications: Current Meds  Medication Sig   acetaminophen (TYLENOL) 500 MG tablet Take 1 tablet (500 mg total) by mouth every 6 (six) hours as needed.   amiodarone (PACERONE) 200 MG tablet Take 1 tablet (200 mg total) by mouth daily.   apixaban (ELIQUIS) 5 MG TABS tablet TAKE 1 TABLET(5 MG) BY MOUTH TWICE DAILY   APPLE CIDER VINEGAR PO Take 1 tablet by mouth every morning. gummies   budesonide-formoterol (SYMBICORT) 80-4.5 MCG/ACT inhaler Inhale 2 puffs into the lungs daily at 6 (six) AM.   bumetanide (BUMEX) 1 MG tablet TAKE 1 TABLET(1 MG) BY MOUTH TWICE DAILY   diclofenac Sodium (VOLTAREN) 1 % GEL Apply 2 g topically 4 (four) times daily.   ferrous  sulfate 325 (65 FE) MG tablet Take 1 tablet (325 mg total) by mouth 3 (three) times daily with meals. (Patient taking differently: Take 325 mg by mouth daily at 6 (six) AM.)   HYDROcodone-acetaminophen (NORCO/VICODIN) 5-325 MG tablet Take 1 tablet by mouth 3 (three) times daily as needed.   loratadine (CLARITIN) 10 MG tablet Take 1 tablet (10 mg total) by mouth daily as needed for allergies.   megestrol (MEGACE) 40 MG tablet Take 1 tablet (40 mg total) by mouth 2 (two) times daily.   metoprolol succinate (TOPROL-XL) 25 MG 24 hr tablet Take 0.5 tablets (12.5 mg total) by mouth daily.   Multiple Vitamins-Minerals (ADULT ONE DAILY GUMMIES) CHEW Chew 2 tablets by mouth every morning.   OVER THE COUNTER  MEDICATION Take 1 capsule by mouth 2 (two) times daily. Beet root capsule   OVER THE COUNTER MEDICATION Take 2 tablets by mouth every morning. Omega XL - purchased online   OVER THE COUNTER MEDICATION Apply 1 application topically 3 (three) times daily as needed (knee pain). Otc foam for knee pain   pantoprazole (PROTONIX) 40 MG tablet Take 1 tablet (40 mg total) by mouth daily.   polyethylene glycol (MIRALAX / GLYCOLAX) 17 g packet Take 17 g by mouth 2 (two) times daily. (Patient taking differently: Take 17 g by mouth as needed for moderate constipation.)   pravastatin (PRAVACHOL) 20 MG tablet Take 1 tablet (20 mg total) by mouth every evening.   sodium chloride (OCEAN) 0.65 % SOLN nasal spray Place 1 spray into both nostrils as needed for congestion (nose irritation).   traMADol (ULTRAM) 50 MG tablet Take 1 tablet (50 mg total) by mouth every 6 (six) hours as needed for moderate pain.   TRIVISC 25 MG/2.5ML SOSY once a week. Knee pain   [DISCONTINUED] bacitracin ointment Apply 1 Application topically 2 (two) times daily.   [DISCONTINUED] capsaicin (ZOSTRIX) 0.025 % cream Apply topically 2 (two) times daily.   [DISCONTINUED] EPINEPHrine (PRIMATENE MIST) 0.125 MG/ACT AERO Inhale 1 puff into the lungs daily as needed (wheezing).   [DISCONTINUED] senna-docusate (SENOKOT-S) 8.6-50 MG tablet Take 1 tablet by mouth 2 (two) times daily.    Allergies:   Patient has no known allergies.   Social History   Socioeconomic History   Marital status: Single    Spouse name: Not on file   Number of children: Not on file   Years of education: Not on file   Highest education level: Not on file  Occupational History   Not on file  Tobacco Use   Smoking status: Former    Current packs/day: 0.00    Average packs/day: 1 pack/day for 30.0 years (30.0 ttl pk-yrs)    Types: Cigarettes    Start date: 6    Quit date: 2013    Years since quitting: 11.7   Smokeless tobacco: Never  Vaping Use   Vaping  status: Never Used  Substance and Sexual Activity   Alcohol use: Never   Drug use: Never   Sexual activity: Not Currently  Other Topics Concern   Not on file  Social History Narrative   Not on file   Social Determinants of Health   Financial Resource Strain: Not on File (09/24/2021)   Received from [employer]    Financial Resource Strain: 0  Recent Concern: Physicist, medical Strain - At Risk (09/24/2021)   Received from [employer], [employer]    Financial Resource Strain: 2  Food Insecurity: Not on File (09/24/2021)   Received from [employer]    Food: 0  Recent Concern: Food Insecurity - At Risk (09/24/2021)   Received from [place], [employer] Insecurity    Food: 2  Transportation Needs: Not on File (09/24/2021)   Received from [employer] Needs    Transportation: 0  Recent Concern: Transportation Needs - At Risk (09/24/2021)   Received from [place], [employer] Needs    Transportation: 2  Physical Activity: Not on File (09/24/2021)   Received from [place], [place]   Physical Activity    Physical Activity: 0  Stress: Not on File (09/24/2021)   Received from [facility], [place]   Stress    Stress: 0  Social Connections: Not on File (09/24/2021)   Received from [place], [place]   Social Connections    Social Connections and Isolation: 0     Family History:  The patient's family history includes ALS in her mother.   ROS:   Please see the history of present illness.    ROS All other systems reviewed and are negative.      No data to display             PHYSICAL EXAM:   VS:  BP 118/60   Pulse (!) 58   Ht 5\' 10"  (1.778 m)   Wt (!) 384 lb (174.2 kg)   SpO2 96%   BMI 55.10 kg/m    GEN: Well nourished, well developed, in no acute distress  HEENT: normal  Neck: no JVD, carotid bruits, or masses Cardiac: RRR; no murmurs, rubs, or gallops,no edema.  Intact distal pulses bilaterally.  Respiratory:  clear to  auscultation bilaterally, normal work of breathing GI: soft, nontender, nondistended, + BS MS: no deformity or atrophy  Skin: warm and dry, no rash Neuro:  Alert and Oriented x 3, Strength and sensation are intact Psych: euthymic mood, full affect  Wt Readings from Last 3 Encounters:  12/28/22 (!) 384 lb (174.2 kg)  04/29/22 (!) 351 lb (159.2 kg)  02/11/22 (!) 304 lb (137.9 kg)      Studies/Labs Reviewed:   Home sleep study, PAP compliance download  Recent Labs: 02/11/2022: BUN 15; Creatinine, Ser 0.79; Potassium 4.1; Sodium 141; TSH 2.680    CHA2DS2-VASc Score = 3   This indicates a 3.2% annual risk of stroke. The patient's score is based upon: CHF History: 1 HTN History: 1 Diabetes History: 0 Stroke History: 0 Vascular Disease History: 0 Age Score: 0 Gender Score: 1      ASSESSMENT:    1. OSA (obstructive sleep apnea)   2. Essential hypertension      PLAN:  In order of problems listed above:  OSA - The patient is tolerating PAP therapy well without any problems. The PAP download performed by his DME was personally reviewed and interpreted by me today and showed an AHI of 3.4 /hr on auto CPAP from 4-15 cm H2O with 83% compliance in using more than 4 hours nightly.  The patient has been using and benefiting from PAP use and will continue to benefit from therapy.  -she has been having problems with sinus HAs from air blowing in her eyes so I will change her to an under the nose FFM>>I will set up an appt with the DME for a mask fitting with her new mask  2.  HTN -BP controlled on exam today -continue prescription drug management with Toprol XL  25mg  daily with PRN refills  Followup with me in 1 year   Time Spent: 20 minutes total time of encounter, including 15 minutes spent in face-to-face patient care on the date of this encounter. This time includes coordination of care and counseling regarding above mentioned problem list. Remainder of non-face-to-face time  involved reviewing chart documents/testing relevant to the patient encounter and documentation in the medical record. I have independently reviewed documentation from referring provider  Medication Adjustments/Labs and Tests Ordered: Current medicines are reviewed at length with the patient today.  Concerns regarding medicines are outlined above.  Medication changes, Labs and Tests ordered today are listed in the Patient Instructions below.  There are no Patient Instructions on file for this visit.   Signed, Armanda Magic, MD  12/28/2022 4:23 PM    [facility] [address] [place], [place], [place]  [address] Phone: [phone]; Fax: [phone]

## 2022-12-28 NOTE — Patient Instructions (Signed)
Medication Instructions:  Your physician recommends that you continue on your current medications as directed. Please refer to the Current Medication list given to you today.  *If you need a refill on your cardiac medications before your next appointment, please call your pharmacy*   Lab Work: None.  If you have labs (blood work) drawn today and your tests are completely normal, you will receive your results only by: MyChart Message (if you have MyChart) OR A paper copy in the mail If you have any lab test that is abnormal or we need to change your treatment, we will call you to review the results.   Testing/Procedures: None.   Follow-Up:    Your next appointment:   1 year(s)  Provider:   Dr. Armanda Magic, MD   Other Instructions Dr. Mayford Knife has ordered an under the nose full face mask for you. Your DME company should be contacting you regarding a mask fitting.

## 2022-12-28 NOTE — Telephone Encounter (Signed)
-----   Message from Armanda Magic sent at 12/28/2022  4:21 PM EDT ----- Please order a new [employer] under the nose full face mask and get an OV with DME for a fitting for the new mask

## 2023-01-03 ENCOUNTER — Other Ambulatory Visit: Payer: Self-pay | Admitting: Internal Medicine

## 2023-01-03 DIAGNOSIS — I5043 Acute on chronic combined systolic (congestive) and diastolic (congestive) heart failure: Secondary | ICD-10-CM

## 2023-01-03 DIAGNOSIS — I480 Paroxysmal atrial fibrillation: Secondary | ICD-10-CM

## 2023-01-03 DIAGNOSIS — D500 Iron deficiency anemia secondary to blood loss (chronic): Secondary | ICD-10-CM

## 2023-01-06 ENCOUNTER — Other Ambulatory Visit: Payer: Self-pay | Admitting: Adult Health

## 2023-01-06 DIAGNOSIS — D500 Iron deficiency anemia secondary to blood loss (chronic): Secondary | ICD-10-CM

## 2023-01-06 DIAGNOSIS — I5043 Acute on chronic combined systolic (congestive) and diastolic (congestive) heart failure: Secondary | ICD-10-CM

## 2023-01-06 DIAGNOSIS — I480 Paroxysmal atrial fibrillation: Secondary | ICD-10-CM

## 2023-01-09 ENCOUNTER — Other Ambulatory Visit: Payer: Self-pay

## 2023-01-09 DIAGNOSIS — I480 Paroxysmal atrial fibrillation: Secondary | ICD-10-CM

## 2023-01-09 DIAGNOSIS — I5043 Acute on chronic combined systolic (congestive) and diastolic (congestive) heart failure: Secondary | ICD-10-CM

## 2023-01-09 DIAGNOSIS — D500 Iron deficiency anemia secondary to blood loss (chronic): Secondary | ICD-10-CM

## 2023-01-09 MED ORDER — AMIODARONE HCL 200 MG PO TABS
200.0000 mg | ORAL_TABLET | Freq: Every day | ORAL | 3 refills | Status: DC
Start: 2023-01-09 — End: 2023-12-28

## 2023-01-29 ENCOUNTER — Other Ambulatory Visit: Payer: Self-pay | Admitting: Internal Medicine

## 2023-01-29 DIAGNOSIS — D500 Iron deficiency anemia secondary to blood loss (chronic): Secondary | ICD-10-CM

## 2023-01-29 DIAGNOSIS — I5043 Acute on chronic combined systolic (congestive) and diastolic (congestive) heart failure: Secondary | ICD-10-CM

## 2023-01-29 DIAGNOSIS — I480 Paroxysmal atrial fibrillation: Secondary | ICD-10-CM

## 2023-01-30 ENCOUNTER — Other Ambulatory Visit: Payer: Self-pay | Admitting: Internal Medicine

## 2023-01-30 DIAGNOSIS — D500 Iron deficiency anemia secondary to blood loss (chronic): Secondary | ICD-10-CM

## 2023-01-30 DIAGNOSIS — I5043 Acute on chronic combined systolic (congestive) and diastolic (congestive) heart failure: Secondary | ICD-10-CM

## 2023-01-30 DIAGNOSIS — I480 Paroxysmal atrial fibrillation: Secondary | ICD-10-CM

## 2023-01-30 NOTE — Telephone Encounter (Signed)
Prescription refill request for Eliquis received. Indication:a flutter Last office visit:9/24 Scr:0.79  10/23 Age: 62 Weight:174.2  kg  Prescription refilled

## 2023-03-28 ENCOUNTER — Other Ambulatory Visit: Payer: Self-pay | Admitting: Adult Health

## 2023-03-31 ENCOUNTER — Other Ambulatory Visit: Payer: Self-pay | Admitting: Adult Health

## 2023-03-31 DIAGNOSIS — D500 Iron deficiency anemia secondary to blood loss (chronic): Secondary | ICD-10-CM

## 2023-03-31 DIAGNOSIS — I5043 Acute on chronic combined systolic (congestive) and diastolic (congestive) heart failure: Secondary | ICD-10-CM

## 2023-03-31 DIAGNOSIS — I480 Paroxysmal atrial fibrillation: Secondary | ICD-10-CM

## 2023-04-26 ENCOUNTER — Other Ambulatory Visit: Payer: Self-pay | Admitting: Internal Medicine

## 2023-04-26 DIAGNOSIS — I5043 Acute on chronic combined systolic (congestive) and diastolic (congestive) heart failure: Secondary | ICD-10-CM

## 2023-04-26 DIAGNOSIS — I480 Paroxysmal atrial fibrillation: Secondary | ICD-10-CM

## 2023-04-26 DIAGNOSIS — D500 Iron deficiency anemia secondary to blood loss (chronic): Secondary | ICD-10-CM

## 2023-04-26 NOTE — Telephone Encounter (Addendum)
 Eliquis  5mg  refill request received. Patient is 63 years old, weight-174.2kg, Crea-0.79 on 02/11/22-needs labs, Diagnosis-Afib, and last seen by Dr.Turner on 12/28/22. Dose is appropriate based on dosing criteria.   Called pt's PCP office and had to leave a message for their medical records department.

## 2023-04-27 NOTE — Telephone Encounter (Signed)
 Pt returned call and made aware labs are needed. She states she will go on Friday around 2pm. Ordered labs she states she has enough eliquis to last until appt.

## 2023-04-27 NOTE — Telephone Encounter (Signed)
 PCP returned call and states pt has not had labs drawn since 09/2021.

## 2023-04-27 NOTE — Telephone Encounter (Signed)
 Called pt, no answer. Voice mailbox full. Called and spoke with pt's daughter, Amber Herring. I made her aware that Amber Herring's labs are overdue. She states she will call back to discuss labs.

## 2023-07-30 ENCOUNTER — Other Ambulatory Visit: Payer: Self-pay | Admitting: Cardiology

## 2023-07-30 DIAGNOSIS — D500 Iron deficiency anemia secondary to blood loss (chronic): Secondary | ICD-10-CM

## 2023-07-30 DIAGNOSIS — I5043 Acute on chronic combined systolic (congestive) and diastolic (congestive) heart failure: Secondary | ICD-10-CM

## 2023-07-30 DIAGNOSIS — I480 Paroxysmal atrial fibrillation: Secondary | ICD-10-CM

## 2023-08-29 ENCOUNTER — Other Ambulatory Visit: Payer: Self-pay | Admitting: Internal Medicine

## 2023-08-29 DIAGNOSIS — I5043 Acute on chronic combined systolic (congestive) and diastolic (congestive) heart failure: Secondary | ICD-10-CM

## 2023-08-29 DIAGNOSIS — I480 Paroxysmal atrial fibrillation: Secondary | ICD-10-CM

## 2023-08-29 DIAGNOSIS — D500 Iron deficiency anemia secondary to blood loss (chronic): Secondary | ICD-10-CM

## 2023-10-05 ENCOUNTER — Other Ambulatory Visit: Payer: Self-pay | Admitting: Internal Medicine

## 2023-10-05 DIAGNOSIS — I480 Paroxysmal atrial fibrillation: Secondary | ICD-10-CM

## 2023-10-05 DIAGNOSIS — I5043 Acute on chronic combined systolic (congestive) and diastolic (congestive) heart failure: Secondary | ICD-10-CM

## 2023-10-05 DIAGNOSIS — D500 Iron deficiency anemia secondary to blood loss (chronic): Secondary | ICD-10-CM

## 2023-10-30 ENCOUNTER — Telehealth: Payer: Self-pay | Admitting: Internal Medicine

## 2023-10-30 DIAGNOSIS — D500 Iron deficiency anemia secondary to blood loss (chronic): Secondary | ICD-10-CM

## 2023-10-30 DIAGNOSIS — I5043 Acute on chronic combined systolic (congestive) and diastolic (congestive) heart failure: Secondary | ICD-10-CM

## 2023-10-30 DIAGNOSIS — I480 Paroxysmal atrial fibrillation: Secondary | ICD-10-CM

## 2023-10-30 MED ORDER — APIXABAN 5 MG PO TABS: 5.0000 mg | ORAL_TABLET | Freq: Two times a day (BID) | ORAL | 1 refills | Status: DC

## 2023-10-30 NOTE — Telephone Encounter (Signed)
 Prescription refill request for Eliquis  received. Indication: Afib  Last office visit: 12/28/22 [place])  Scr: 0.9 ( 05/11/23 via [facility])  Age: 63 Weight: 174.2kg  Appropriate dose. Refill sent.

## 2023-10-30 NOTE — Telephone Encounter (Signed)
*  STAT* If patient is at the pharmacy, call can be transferred to refill team.   1. Which medications need to be refilled? (please list name of each medication and dose if known) ELIQUIS  5 MG TABS tablet [[id number]]    2. Would you like to learn more about the convenience, safety, & potential cost savings by using the [facility]? Na      3. Are you open to using the [facility] (Type [facility]. No ).   4. Which pharmacy/location (including street and city if local pharmacy) is medication to be sent to? [id number] [place], [place] - [address] AT [facility] [address], [place] [address] Phone: [phone]  Fax: [phone] DEA #: [id number]    5. Do they need a 30 day or 90 day supply? 90

## 2023-11-01 ENCOUNTER — Telehealth: Payer: Self-pay

## 2023-11-01 ENCOUNTER — Other Ambulatory Visit (HOSPITAL_COMMUNITY): Payer: Self-pay

## 2023-11-01 ENCOUNTER — Telehealth: Payer: Self-pay | Admitting: Internal Medicine

## 2023-11-01 DIAGNOSIS — I480 Paroxysmal atrial fibrillation: Secondary | ICD-10-CM

## 2023-11-01 NOTE — Telephone Encounter (Signed)
 Pharmacy Patient Advocate Encounter  Insurance verification completed.   The patient is insured through [employer] claim for [employer]. Currently a quantity of 60 is a 30 day supply and the co-pay is NA, REFILL TOO SOON.  Patient has a Medicare plan so they don't qualify to use a copay card. They will have to spend at least 3% of their annual income towards medication copay's in order to qualify for PAP with [facility]. Advise patient call the customer service number on their insurance card and request a medicare payment plan if they cannot afford deductible.     This test claim was processed through [facility]- copay amounts may vary at other pharmacies due to pharmacy/plan contracts, or as the patient moves through the different stages of their insurance plan.

## 2023-11-01 NOTE — Telephone Encounter (Signed)
 Patient has a Medicare plan so they don't qualify to use a copay card. They will have to spend at least 3% of their annual income towards medication copay's in order to qualify for PAP with [facility]. Advise patient call the customer service number on their insurance card and request a medicare payment plan if they cannot afford deductible.

## 2023-11-01 NOTE — Telephone Encounter (Signed)
 Pt c/o medication issue:  1. Name of Medication:   apixaban  (ELIQUIS ) 5 MG TABS tablet    2. How are you currently taking this medication (dosage and times per day)? As written  3. Are you having a reaction (difficulty breathing--STAT)? no  4. What is your medication issue? Pt went to pick up script and it was $390.00 after ins. She wants to know if there is a coupon or something else she can take

## 2023-11-02 NOTE — Telephone Encounter (Signed)
 Spoke to patient. She states she is on disability and can not afford the co-pay, does not want to proceed with payment plan. She is interested in the patient assistance program and if that does not work, she would like to ask provider to prescribe different medication without high cost.   Is the medication cost assistance program something you can look into for this patient?

## 2023-11-02 NOTE — Telephone Encounter (Signed)
 Please see messages. Pt not able to pay copay for Eliquis , does not qualify for coupons or assistance. Pt asking to change medication to something affordable for her.

## 2023-11-02 NOTE — Telephone Encounter (Signed)
 If patient has not paid anything into deductible yet for this year, they will not be approved for patient assistance with [employer].

## 2023-11-06 DIAGNOSIS — I480 Paroxysmal atrial fibrillation: Secondary | ICD-10-CM | POA: Insufficient documentation

## 2023-11-07 NOTE — Telephone Encounter (Signed)
 Not sure if patient is currently taking Eliquis . IF so, recommend starting warfarin 5mg  once daily and overlap for 3 days with Eliquis . Then d/c Eliquis .  If she is not on Eliquis , recommend starting warfarin 5mg  once daily. Schedule patient for new Coumadin appt to check INR approximately 5 days after starting warfarin

## 2023-11-07 NOTE — Telephone Encounter (Signed)
 I spoke with patient about transition from Eliquis  to Coumadin, but she is reluctant to come to office for monitoring.  She will think about it and let us  know by Friday 7/25.

## 2023-11-10 ENCOUNTER — Telehealth: Payer: Self-pay

## 2023-11-10 NOTE — Telephone Encounter (Signed)
 I spoke to patient and she told me that she will stay with Eliquis , because [employer] apparently worked out a plan with her.  I told her that I would pass this along and if she needed anything to give us  a call.  She thanked me for the call and verbalized understanding.

## 2023-12-27 ENCOUNTER — Telehealth: Payer: Self-pay | Admitting: Orthopedic Surgery

## 2023-12-27 ENCOUNTER — Other Ambulatory Visit: Payer: Self-pay | Admitting: Cardiology

## 2023-12-27 DIAGNOSIS — D500 Iron deficiency anemia secondary to blood loss (chronic): Secondary | ICD-10-CM

## 2023-12-27 DIAGNOSIS — I5043 Acute on chronic combined systolic (congestive) and diastolic (congestive) heart failure: Secondary | ICD-10-CM

## 2023-12-27 DIAGNOSIS — I480 Paroxysmal atrial fibrillation: Secondary | ICD-10-CM

## 2023-12-27 NOTE — Telephone Encounter (Signed)
 Pt's daughter Rosina came in office and gave pt's medicare card. Please submit to insurance for bil knee gel injections. Please call pt when approved at [phone].

## 2023-12-29 ENCOUNTER — Encounter: Payer: Self-pay | Admitting: Cardiology

## 2024-01-11 ENCOUNTER — Other Ambulatory Visit: Payer: Self-pay

## 2024-01-11 DIAGNOSIS — Z1231 Encounter for screening mammogram for malignant neoplasm of breast: Secondary | ICD-10-CM

## 2024-01-11 DIAGNOSIS — M170 Bilateral primary osteoarthritis of knee: Secondary | ICD-10-CM

## 2024-01-11 NOTE — Telephone Encounter (Signed)
VOB has been submitted  

## 2024-01-26 ENCOUNTER — Ambulatory Visit: Admitting: Surgical

## 2024-01-30 ENCOUNTER — Other Ambulatory Visit: Payer: Self-pay | Admitting: Cardiology

## 2024-01-30 DIAGNOSIS — I5043 Acute on chronic combined systolic (congestive) and diastolic (congestive) heart failure: Secondary | ICD-10-CM

## 2024-01-30 DIAGNOSIS — I480 Paroxysmal atrial fibrillation: Secondary | ICD-10-CM

## 2024-01-30 DIAGNOSIS — D500 Iron deficiency anemia secondary to blood loss (chronic): Secondary | ICD-10-CM

## 2024-02-02 ENCOUNTER — Other Ambulatory Visit: Payer: Self-pay | Admitting: Internal Medicine

## 2024-02-02 DIAGNOSIS — I5043 Acute on chronic combined systolic (congestive) and diastolic (congestive) heart failure: Secondary | ICD-10-CM

## 2024-02-02 DIAGNOSIS — D500 Iron deficiency anemia secondary to blood loss (chronic): Secondary | ICD-10-CM

## 2024-02-02 DIAGNOSIS — I480 Paroxysmal atrial fibrillation: Secondary | ICD-10-CM

## 2024-02-12 ENCOUNTER — Other Ambulatory Visit: Payer: Self-pay | Admitting: Internal Medicine

## 2024-02-12 DIAGNOSIS — I480 Paroxysmal atrial fibrillation: Secondary | ICD-10-CM

## 2024-02-12 DIAGNOSIS — D500 Iron deficiency anemia secondary to blood loss (chronic): Secondary | ICD-10-CM

## 2024-02-12 DIAGNOSIS — I5043 Acute on chronic combined systolic (congestive) and diastolic (congestive) heart failure: Secondary | ICD-10-CM

## 2024-02-14 DIAGNOSIS — Z0289 Encounter for other administrative examinations: Secondary | ICD-10-CM

## 2024-02-15 ENCOUNTER — Ambulatory Visit (INDEPENDENT_AMBULATORY_CARE_PROVIDER_SITE_OTHER): Payer: Self-pay | Admitting: Adult Health

## 2024-02-15 ENCOUNTER — Encounter (INDEPENDENT_AMBULATORY_CARE_PROVIDER_SITE_OTHER): Payer: Self-pay | Admitting: Adult Health

## 2024-02-15 VITALS — Ht 69.0 in | Wt >= 6400 oz

## 2024-02-15 DIAGNOSIS — I517 Cardiomegaly: Secondary | ICD-10-CM

## 2024-02-15 DIAGNOSIS — Z6841 Body Mass Index (BMI) 40.0 and over, adult: Secondary | ICD-10-CM

## 2024-02-15 DIAGNOSIS — M170 Bilateral primary osteoarthritis of knee: Secondary | ICD-10-CM

## 2024-02-15 DIAGNOSIS — I484 Atypical atrial flutter: Secondary | ICD-10-CM

## 2024-02-15 DIAGNOSIS — D500 Iron deficiency anemia secondary to blood loss (chronic): Secondary | ICD-10-CM

## 2024-02-15 DIAGNOSIS — I5043 Acute on chronic combined systolic (congestive) and diastolic (congestive) heart failure: Secondary | ICD-10-CM

## 2024-02-15 DIAGNOSIS — I10 Essential (primary) hypertension: Secondary | ICD-10-CM

## 2024-02-15 DIAGNOSIS — E559 Vitamin D deficiency, unspecified: Secondary | ICD-10-CM

## 2024-02-15 DIAGNOSIS — Z0000 Encounter for general adult medical examination without abnormal findings: Secondary | ICD-10-CM

## 2024-02-15 NOTE — Progress Notes (Unsigned)
 Office: [phone]  /  Fax: [phone]   Initial Visit    Amber Herring was seen in clinic today to evaluate for obesity. She is interested in losing weight to improve overall health and reduce the risk of weight related complications. She presents today to review program treatment options, initial physical assessment, and evaluation.     Pt unable to stand on bioimpedance scale. She was weighed in wheelchair (451 lbs)- moved safely to a chair, then wheelchair weighed (32 lbs) Pt's estimates weight is 425 lbs  She was referred by: PCP  When asked what else they would like to accomplish? She states: Adopt a healthier eating pattern and lifestyle, Improve energy levels and physical activity, Improve existing medical conditions, Reduce number of medications, Need to lose weight for Bilateral Knee Replacement and BMI <40, and Improve quality of life  When asked how has your weight affected you? She states: Contributed to medical problems, Contributed to orthopedic problems or mobility issues, Having fatigue, Having poor endurance, Problems with eating patterns, and Has affected mood   Weight history: 425 lbs  Highest weight: 425 lbs  Some associated conditions: Hypertension, Arthritis:bil knee, and Vitamin D Deficiency  Contributing factors: family history of obesity, disruption of circadian rhythm / sleep disordered breathing, consumption of processed foods, use of obesogenic medications: Other: Narcotics, moderate to high levels of stress, and reduced physical activity  Weight promoting medications identified: Other: Narcotics  Prior weight loss attempts: None  Current nutrition plan: None  Current level of physical activity: None  Current or previous pharmacotherapy: None  Response to medication: Never tried medications   Past medical history includes:   Past Medical History:  Diagnosis Date   Allergy    Anemia    iron  defienency   Anxiety    Arthritis    CHF  (congestive heart failure) (HCC)    GERD (gastroesophageal reflux disease)    Hyperlipidemia    Impaired fasting glucose    Metabolic syndrome    Obesity      Objective    Ht 5' 9 (1.753 m)   Wt (!) 425 lb (192.8 kg)   BMI 62.76 kg/m  She was weighed on the bioimpedance scale: Body mass index is 62.76 kg/m.  Body Fat%:UNABLE TO CALC, Visceral Fat Rating:UNABLE TO CALC, Weight trend over the last 12 months: Increasing  General:  Alert, oriented and cooperative. Patient is in no acute distress.  Respiratory: Normal respiratory effort, no problems with respiration noted   Gait: able to ambulate independently  Mental Status: Normal mood and affect. Normal behavior. Normal judgment and thought content.   DIAGNOSTIC DATA REVIEWED:  BMET    Component Value Date/Time   NA 141 02/11/2022 1548   K 4.1 02/11/2022 1548   CL 99 02/11/2022 1548   CO2 21 02/11/2022 1548   GLUCOSE 79 02/11/2022 1548   GLUCOSE 135 (H) 05/06/2021 0444   BUN 15 02/11/2022 1548   CREATININE 0.79 02/11/2022 1548   CALCIUM  9.7 02/11/2022 1548   GFRNONAA >60 05/06/2021 0444   No results found for: HGBA1C No results found for: INSULIN CBC    Component Value Date/Time   WBC 7.3 08/13/2021 1112   WBC 7.1 05/05/2021 0448   RBC 4.44 08/13/2021 1112   RBC 5.68 (H) 05/05/2021 0448   HGB 13.3 08/13/2021 1112   HCT 39.6 08/13/2021 1112   PLT 298 08/13/2021 1112   MCV 89 08/13/2021 1112   MCH 30.0 08/13/2021 1112   MCH 24.1 (L)  05/05/2021 0448   MCHC 33.6 08/13/2021 1112   MCHC 30.0 05/05/2021 0448   RDW 13.7 08/13/2021 1112   Iron /TIBC/Ferritin/ %Sat    Component Value Date/Time   IRON  19 (L) 04/17/2021 0525   TIBC 394 04/17/2021 0525   FERRITIN 8 (L) 04/17/2021 0525   IRONPCTSAT 5 (L) 04/17/2021 0525   Lipid Panel     Component Value Date/Time   CHOL 222 (H) 08/13/2021 1112   TRIG 67 08/13/2021 1112   HDL 68 08/13/2021 1112   CHOLHDL 3.3 08/13/2021 1112   LDLCALC 142 (H) 08/13/2021 1112    Hepatic Function Panel     Component Value Date/Time   PROT 7.4 08/13/2021 1112   ALBUMIN 4.3 08/13/2021 1112   AST 24 08/13/2021 1112   ALT 26 08/13/2021 1112   ALKPHOS 98 08/13/2021 1112   BILITOT 0.5 08/13/2021 1112      Component Value Date/Time   TSH 2.680 02/11/2022 1548     Assessment and Plan   Healthcare maintenance  Cardiomegaly  Atypical atrial flutter (HCC)  Acute on chronic combined systolic and diastolic CHF (congestive heart failure) (HCC)  Iron  deficiency anemia due to chronic blood loss  Morbid obesity (HCC), STARTING BMI    Assessment and Plan     ESTABLISH WITH [facility]        Obesity Treatment / Action Plan:  Patient will work on garnering support from family and friends to begin weight loss journey. Will work on eliminating or reducing the presence of highly palatable, calorie dense foods in the home. Will complete provided nutritional and psychosocial assessment questionnaire before the next appointment. Will be scheduled for indirect calorimetry to determine resting energy expenditure in a fasting state.  This will allow us  to create a reduced calorie, high-protein meal plan to promote loss of fat mass while preserving muscle mass. Will work on reducing intake of added sugars, simple sugars and processed carbs. Counseled on the health benefits of losing 5%-15% of total body weight. Was counseled on nutritional approaches to weight loss and benefits of reducing processed foods and consuming plant-based foods and high quality protein as part of nutritional weight management. Was counseled on pharmacotherapy and role as an adjunct in weight management.   Obesity Education Performed Today:  She was weighed on the bioimpedance scale and results were discussed and documented in the synopsis.  We discussed obesity as a disease and the importance of a more detailed evaluation of all the factors contributing to the disease.  We discussed the  importance of long term lifestyle changes which include nutrition, exercise and behavioral modifications as well as the importance of customizing this to her specific health and social needs.  We discussed the benefits of reaching a healthier weight to alleviate the symptoms of existing conditions and reduce the risks of the biomechanical, metabolic and psychological effects of obesity.  We reviewed the four pillars of obesity medicine and importance of using a multimodal approach.  We reviewed the basic principles in weight management.   Amber Herring appears to be in the action stage of change and states they are ready to start intensive lifestyle modifications and behavioral modifications.  I have spent 30 minutes in the care of the patient today including: 3 minutes before the visit reviewing and preparing the chart. 24 minutes face-to-face assessing and reviewing listed medical problems as outlined in obesity care plan, providing nutritional and behavioral counseling on topics outlined in the obesity care plan, counseling regarding anti-obesity medication as  outlined in obesity care plan, independently interpreting test results and goals of care, as described in assessment and plan, reviewing and discussing biometric information and progress, and reviewing latest PCP notes and specialist consultations 3 minutes after the visit updating chart and documentation of encounter.  Reviewed by clinician on day of visit: allergies, medications, problem list, medical history, surgical history, family history, social history, and previous encounter notes pertinent to obesity diagnosis.  Amber Herring d. Amber Anstey, NP-C

## 2024-02-16 ENCOUNTER — Ambulatory Visit: Admitting: Surgical

## 2024-02-16 DIAGNOSIS — M170 Bilateral primary osteoarthritis of knee: Secondary | ICD-10-CM | POA: Diagnosis not present

## 2024-02-18 ENCOUNTER — Encounter: Payer: Self-pay | Admitting: Surgical

## 2024-02-18 MED ORDER — SODIUM HYALURONATE 60 MG/3ML IX PRSY
60.0000 mg | PREFILLED_SYRINGE | INTRA_ARTICULAR | Status: AC | PRN
  Administered 2024-02-16: 60 mg via INTRA_ARTICULAR

## 2024-02-18 MED ORDER — LIDOCAINE HCL 1 % IJ SOLN
5.0000 mL | INTRAMUSCULAR | Status: AC | PRN
  Administered 2024-02-16: 5 mL

## 2024-02-18 NOTE — Progress Notes (Signed)
   Procedure Note  Patient: Amber Herring             Date of Birth: 07/24/1960           MRN: [id number]             Visit Date: 02/16/2024  Procedures: Visit Diagnoses: No diagnosis found.  Large Joint Inj: bilateral knee on 02/16/2024 9:29 AM Indications: diagnostic evaluation, joint swelling and pain Details: 18 G 1.5 in needle, superolateral approach  Arthrogram: No  Medications (Right): 5 mL lidocaine  1 %; 60 mg Sodium Hyaluronate 60 MG/3ML Medications (Left): 5 mL lidocaine  1 %; 60 mg Sodium Hyaluronate 60 MG/3ML Outcome: tolerated well, no immediate complications Procedure, treatment alternatives, risks and benefits explained, specific risks discussed. Consent was given by the patient. Immediately prior to procedure a time out was called to verify the correct patient, procedure, equipment, support staff and site/side marked as required. Patient was prepped and draped in the usual sterile fashion.     Previous gel injections gave about 1 year of relief

## 2024-02-19 ENCOUNTER — Encounter: Payer: Self-pay | Admitting: Radiology

## 2024-02-28 ENCOUNTER — Ambulatory Visit (INDEPENDENT_AMBULATORY_CARE_PROVIDER_SITE_OTHER): Payer: Self-pay | Admitting: Family Medicine

## 2024-03-01 ENCOUNTER — Ambulatory Visit: Admitting: Internal Medicine

## 2024-03-08 ENCOUNTER — Ambulatory Visit
Admission: RE | Admit: 2024-03-08 | Discharge: 2024-03-08 | Disposition: A | Discharge status: Home / Self Care | Source: Ambulatory Visit

## 2024-03-08 DIAGNOSIS — Z1231 Encounter for screening mammogram for malignant neoplasm of breast: Secondary | ICD-10-CM

## 2024-03-12 ENCOUNTER — Ambulatory Visit (INDEPENDENT_AMBULATORY_CARE_PROVIDER_SITE_OTHER): Payer: Self-pay | Admitting: Family Medicine

## 2024-03-18 NOTE — Progress Notes (Unsigned)
 Cardiology Office Note:  .   Date:  03/18/2024  ID:  Amber Herring, DOB [birth year], MRN [id number] PCP: Netta Ram, NP  [facility] Providers Cardiologist:  Vinie JAYSON Maxcy, MD { Click to update primary MD,subspecialty MD or APP then REFRESH:1}   History of Present Illness: .   Amber Herring is a 63 y.o. female   with a history of chronic systolic heart failure, paroxysmal atrial fibrillation, hypertension, hyperlipidemia, snoring, iron  deficiency anemia, GERD, and obesity OSA.  ROS: ***  Studies Reviewed: SABRA         Prior CV Studies: {Select studies to display:[id number]}   Echo December 12, 2022 IMPRESSIONS     1. Left ventricular ejection fraction, by estimation, is 60 to 65%. The  left ventricle has normal function. The left ventricle has no regional  wall motion abnormalities. Left ventricular diastolic parameters were  normal.   2. Right ventricular systolic function is mildly reduced. The right  ventricular size is moderately enlarged. The estimated right ventricular  systolic pressure is 32.5 mmHg.   3. Right atrial size was moderately dilated.   4. The mitral valve is normal in structure. No evidence of mitral valve  regurgitation. No evidence of mitral stenosis.   5. The aortic valve is tricuspid. There is mild calcification of the  aortic valve. Aortic valve regurgitation is not visualized. No aortic  stenosis is present.   6. Aortic dilatation noted. There is borderline dilatation of the  ascending aorta, measuring 38 mm.   7. The inferior vena cava is normal in size with greater than 50%  respiratory variability, suggesting right atrial pressure of 3 mmHg.   TEE-DCCV 04/26/21:  1. Left ventricular ejection fraction, by estimation, is 45 to 50%. The  left ventricle has low normal function.   2. Right ventricular systolic function is mildly reduced. The right  ventricular size is moderately enlarged.   3. Left atrial size was mildly dilated. No left  atrial/left atrial  appendage thrombus was detected.   4. Right atrial size was severely dilated.   5. The mitral valve is normal in structure. Trivial mitral valve  regurgitation.   6. There is severe tricuspid regurgitation likely due to annular dilation  in the setting of severe RA enlargement.   7. The aortic valve is tricuspid. Aortic valve regurgitation is trivial.   8. TEE was aborted early due to patient hypoxia during examination.   9. Following TEE and improvement of hypoxia, patient was successfully  cardioverted back to sinus bradycardia.    Echocardiogram 04/17/2021  1. Left ventricular ejection fraction, by estimation, is 45 to 50%. Left  ventricular ejection fraction by 2D MOD biplane is 46.2 %. The left  ventricle has mildly decreased function. The left ventricle demonstrates  global hypokinesis. Indeterminate  diastolic filling due to E-A fusion.   2. Right ventricular systolic function is normal. The right ventricular  size is moderately enlarged. There is mildly elevated pulmonary artery  systolic pressure. The estimated right ventricular systolic pressure is  37.1 mmHg.   3. Right atrial size was severely dilated.   4. The mitral valve is abnormal. Mild mitral valve regurgitation.   5. Tricuspid valve regurgitation is mild to moderate.   6. The aortic valve is tricuspid. Aortic valve regurgitation is not  visualized.   7. The inferior vena cava is dilated in size with <50% respiratory  variability, suggesting right atrial pressure of 15 mmHg.     Risk Assessment/Calculations:   {Does this  patient have ATRIAL FIBRILLATION?:[phone]} No BP recorded.  {Refresh Note OR Click here to enter BP  :1}***   STOP-Bang Score:     { Consider Dx Sleep Disordered Breathing or Sleep Apnea  ICD G47.33          :1}    Physical Exam:   VS:  There were no vitals taken for this visit.   Orhtostatics: No data found. Wt Readings from Last 3 Encounters:  02/15/24 (!) 425 lb  (192.8 kg)  12/28/22 (!) 384 lb (174.2 kg)  04/29/22 (!) 351 lb (159.2 kg)    GEN: Well nourished, well developed in no acute distress NECK: No JVD; No carotid bruits CARDIAC: ***RRR, no murmurs, rubs, gallops RESPIRATORY:  Clear to auscultation without rales, wheezing or rhonchi  ABDOMEN: Soft, non-tender, non-distended EXTREMITIES:  No edema; No deformity   ASSESSMENT AND PLAN: .    Paroxysmal atrial fibrillation:  Maintaining NSR on exam today.  Denies any recent palpitations. Continue amiodarone , metoprolol , Eliquis .   Chronic systolic heart failure:  Echo in 04/2021 showed EF 45 to 50%, mildly decreased LV function, indeterminate diastolic parameters, normal RV systolic function, mild mitral valve regurgitation, mild to moderate tricuspid valve regurgitation. Of note,pt states her last documented weight is inaccurate. Today's documented weight is closer to her baseline. Euvolemic and well compensated on exam.  Continue metoprolol , Bumex .   Hypertension: BP well controlled. Continue current antihypertensive regimen.    Hyperlipidemia: LDL was 142 in 07/2021.  Continue pravastatin .   OSA.    Obesity: I advised pt to discuss GLP-1 agonist with her PCP, however, she stated her PCP told her she was not comfortable prescribing this medication. I will discuss with our pharmacy team recommendations for initiation of GLP-1 agonist [facility]).       {Are you ordering a CV Procedure (e.g. stress test, cath, DCCV, TEE, etc)?   Press F2        :[id number]}  Dispo: ***  Signed, Olivia Pavy, PA-C

## 2024-03-22 ENCOUNTER — Other Ambulatory Visit: Payer: Self-pay | Admitting: Cardiology

## 2024-03-22 DIAGNOSIS — I480 Paroxysmal atrial fibrillation: Secondary | ICD-10-CM

## 2024-03-22 DIAGNOSIS — D500 Iron deficiency anemia secondary to blood loss (chronic): Secondary | ICD-10-CM

## 2024-03-22 DIAGNOSIS — I5043 Acute on chronic combined systolic (congestive) and diastolic (congestive) heart failure: Secondary | ICD-10-CM

## 2024-04-01 ENCOUNTER — Telehealth: Payer: Self-pay | Admitting: *Deleted

## 2024-04-01 ENCOUNTER — Encounter: Payer: Self-pay | Admitting: Physician Assistant

## 2024-04-01 ENCOUNTER — Ambulatory Visit: Attending: Physician Assistant | Admitting: Physician Assistant

## 2024-04-01 VITALS — BP 110/60 | HR 61 | Ht 69.0 in | Wt >= 6400 oz

## 2024-04-01 DIAGNOSIS — I10 Essential (primary) hypertension: Secondary | ICD-10-CM

## 2024-04-01 DIAGNOSIS — G4733 Obstructive sleep apnea (adult) (pediatric): Secondary | ICD-10-CM | POA: Diagnosis not present

## 2024-04-01 DIAGNOSIS — I5022 Chronic systolic (congestive) heart failure: Secondary | ICD-10-CM | POA: Diagnosis not present

## 2024-04-01 DIAGNOSIS — I480 Paroxysmal atrial fibrillation: Secondary | ICD-10-CM

## 2024-04-01 DIAGNOSIS — D500 Iron deficiency anemia secondary to blood loss (chronic): Secondary | ICD-10-CM

## 2024-04-01 DIAGNOSIS — I5043 Acute on chronic combined systolic (congestive) and diastolic (congestive) heart failure: Secondary | ICD-10-CM

## 2024-04-01 MED ORDER — BUMETANIDE 1 MG PO TABS: ORAL_TABLET | ORAL | 3 refills | Status: DC

## 2024-04-01 MED ORDER — AMIODARONE HCL 200 MG PO TABS: 200.0000 mg | ORAL_TABLET | Freq: Every day | ORAL | 3 refills | Status: AC

## 2024-04-01 NOTE — Patient Instructions (Signed)
 Medication Instructions:  Bumex  1G 2 tablets 2 times per day for 3 days, then 1 tablet 2 times per day.  *If you need a refill on your cardiac medications before your next appointment, please call your pharmacy*  Lab Work: None  If you have labs (blood work) drawn today and your tests are completely normal, you will receive your results only by: MyChart Message (if you have MyChart) OR A paper copy in the mail If you have any lab test that is abnormal or we need to change your treatment, we will call you to review the results.  Testing/Procedures: None   Follow-Up: At [facility], you and your health needs are our priority.  As part of our continuing mission to provide you with exceptional heart care, our providers are all part of one team.  This team includes your primary Cardiologist (physician) and Advanced Practice Providers or APPs (Physician Assistants and Nurse Practitioners) who all work together to provide you with the care you need, when you need it.  Your next appointment:   6 month(s)  Provider:   Vinie JAYSON Maxcy, MD    We recommend signing up for the patient portal called MyChart.  Sign up information is provided on this After Visit Summary.  MyChart is used to connect with patients for Virtual Visits (Telemedicine).  Patients are able to view lab/test results, encounter notes, upcoming appointments, etc.  Non-urgent messages can be sent to your provider as well.   To learn more about what you can do with MyChart, go to [email or web address].   Other Instructions Two Gram Sodium Diet 2000 mg  What is Sodium? Sodium is a mineral found naturally in many foods. The most significant source of sodium in the diet is table salt, which is about 40% sodium.  Processed, convenience, and preserved foods also contain a large amount of sodium.  The body needs only 500 mg of sodium daily to function,  A normal diet provides more than enough sodium even if you do not use  salt.  Why Limit Sodium? A build up of sodium in the body can cause thirst, increased blood pressure, shortness of breath, and water retention.  Decreasing sodium in the diet can reduce edema and risk of heart attack or stroke associated with high blood pressure.  Keep in mind that there are many other factors involved in these health problems.  Heredity, obesity, lack of exercise, cigarette smoking, stress and what you eat all play a role.  General Guidelines: Do not add salt at the table or in cooking.  One teaspoon of salt contains over 2 grams of sodium. Read food labels Avoid processed and convenience foods Ask your dietitian before eating any foods not dicussed in the menu planning guidelines Consult your physician if you wish to use a salt substitute or a sodium containing medication such as antacids.  Limit milk and milk products to 16 oz (2 cups) per day.  Shopping Hints: READ LABELS!! Dietetic does not necessarily mean low sodium. Salt and other sodium ingredients are often added to foods during processing.    Menu Planning Guidelines Food Group Choose More Often Avoid  Beverages (see also the milk group All fruit juices, low-sodium, salt-free vegetables juices, low-sodium carbonated beverages Regular vegetable or tomato juices, commercially softened water used for drinking or cooking  Breads and Cereals Enriched white, wheat, rye and pumpernickel bread, hard rolls and dinner rolls; muffins, cornbread and waffles; most dry cereals, cooked cereal without added salt; unsalted  crackers and breadsticks; low sodium or homemade bread crumbs Bread, rolls and crackers with salted tops; quick breads; instant hot cereals; pancakes; commercial bread stuffing; self-rising flower and biscuit mixes; regular bread crumbs or cracker crumbs  Desserts and Sweets Desserts and sweets mad with mild should be within allowance Instant pudding mixes and cake mixes  Fats Butter or margarine; vegetable oils;  unsalted salad dressings, regular salad dressings limited to 1 Tbs; light, sour and heavy cream Regular salad dressings containing bacon fat, bacon bits, and salt pork; snack dips made with instant soup mixes or processed cheese; salted nuts  Fruits Most fresh, frozen and canned fruits Fruits processed with salt or sodium-containing ingredient (some dried fruits are processed with sodium sulfites        Vegetables Fresh, frozen vegetables and low- sodium canned vegetables Regular canned vegetables, sauerkraut, pickled vegetables, and others prepared in brine; frozen vegetables in sauces; vegetables seasoned with ham, bacon or salt pork  Condiments, Sauces, Miscellaneous  Salt substitute with physician's approval; pepper, herbs, spices; vinegar, lemon or lime juice; hot pepper sauce; garlic powder, onion powder, low sodium soy sauce (1 Tbs.); low sodium condiments (ketchup, chili sauce, mustard) in limited amounts (1 tsp.) fresh ground horseradish; unsalted tortilla chips, pretzels, potato chips, popcorn, salsa (1/4 cup) Any seasoning made with salt including garlic salt, celery salt, onion salt, and seasoned salt; sea salt, rock salt, kosher salt; meat tenderizers; monosodium glutamate; mustard, regular soy sauce, barbecue, sauce, chili sauce, teriyaki sauce, steak sauce, Worcestershire sauce, and most flavored vinegars; canned gravy and mixes; regular condiments; salted snack foods, olives, picles, relish, horseradish sauce, catsup   Food preparation: Try these seasonings Meats:    Pork Sage, onion Serve with applesauce  Chicken Poultry seasoning, thyme, parsley Serve with cranberry sauce  Lamb Curry powder, rosemary, garlic, thyme Serve with mint sauce or jelly  Veal Marjoram, basil Serve with current jelly, cranberry sauce  Beef Pepper, bay leaf Serve with dry mustard, unsalted chive butter  Fish [place] leaf, dill Serve with unsalted lemon butter, unsalted parsley butter  Vegetables:     Asparagus Lemon juice   Broccoli Lemon juice   Carrots Mustard dressing parsley, mint, nutmeg, glazed with unsalted butter and sugar   Green beans Marjoram, lemon juice, nutmeg,dill seed   Tomatoes Basil, marjoram, onion   Spice /blend for [employer] 4 tsp ground thyme 1 tsp ground sage 3 tsp ground rosemary 4 tsp ground marjoram   Test your knowledge A product that says Salt Free may still contain sodium. True or False Garlic Powder and Hot Pepper Sauce an be used as alternative seasonings.True or False Processed foods have more sodium than fresh foods.  True or False Canned Vegetables have less sodium than froze True or False   WAYS TO DECREASE YOUR SODIUM INTAKE Avoid the use of added salt in cooking and at the table.  Table salt (and other prepared seasonings which contain salt) is probably one of the greatest sources of sodium in the diet.  Unsalted foods can gain flavor from the sweet, sour, and butter taste sensations of herbs and spices.  Instead of using salt for seasoning, try the following seasonings with the foods listed.  Remember: how you use them to enhance natural food flavors is limited only by your creativity... Allspice-Meat, fish, eggs, fruit, peas, red and yellow vegetables Almond Extract-Fruit baked goods Anise Seed-Sweet breads, fruit, carrots, beets, cottage cheese, cookies (tastes like licorice) Basil-Meat, fish, eggs, vegetables, rice, vegetables salads, soups, sauces Bay  Leaf-Meat, fish, stews, poultry Burnet-Salad, vegetables (cucumber-like flavor) Caraway Seed-Bread, cookies, cottage cheese, meat, vegetables, cheese, rice Cardamon-Baked goods, fruit, soups Celery Powder or seed-Salads, salad dressings, sauces, meatloaf, soup, bread.Do not use  celery salt Chervil-Meats, salads, fish, eggs, vegetables, cottage cheese (parsley-like flavor) Chili Power-Meatloaf, chicken cheese, corn, eggplant, egg dishes Chives-Salads cottage cheese, egg dishes, soups,  vegetables, sauces Cilantro-Salsa, casseroles Cinnamon-Baked goods, fruit, pork, lamb, chicken, carrots Cloves-Fruit, baked goods, fish, pot roast, green beans, beets, carrots Coriander-Pastry, cookies, meat, salads, cheese (lemon-orange flavor) Cumin-Meatloaf, fish,cheese, eggs, cabbage,fruit pie (caraway flavor) [employer], fruit, eggs, fish, poultry, cottage cheese, vegetables Dill Seed-Meat, cottage cheese, poultry, vegetables, fish, salads, bread Fennel Seed-Bread, cookies, apples, pork, eggs, fish, beets, cabbage, cheese, Licorice-like flavor Garlic-(buds or powder) Salads, meat, poultry, fish, bread, butter, vegetables, potatoes.Do not  use garlic salt Ginger-Fruit, vegetables, baked goods, meat, fish, poultry Horseradish Root-Meet, vegetables, butter Lemon Juice or Extract-Vegetables, fruit, tea, baked goods, fish salads Mace-Baked goods fruit, vegetables, fish, poultry (taste like nutmeg) Maple Extract-Syrups Marjoram-Meat, chicken, fish, vegetables, breads, green salads (taste like Sage) Mint-Tea, lamb, sherbet, vegetables, desserts, carrots, cabbage Mustard, Dry or Seed-Cheese, eggs, meats, vegetables, poultry Nutmeg-Baked goods, fruit, chicken, eggs, vegetables, desserts Onion Powder-Meat, fish, poultry, vegetables, cheese, eggs, bread, rice salads (Do not use   Onion salt) Orange Extract-Desserts, baked goods Oregano-Pasta, eggs, cheese, onions, pork, lamb, fish, chicken, vegetables, green salads Paprika-Meat, fish, poultry, eggs, cheese, vegetables Parsley Flakes-Butter, vegetables, meat fish, poultry, eggs, bread, salads (certain forms may   Contain sodium Pepper-Meat fish, poultry, vegetables, eggs Peppermint Extract-Desserts, baked goods Poppy Seed-Eggs, bread, cheese, fruit dressings, baked goods, noodles, vegetables, cottage  [employer], poultry, meat, fish, cauliflower, turnips,eggs bread Saffron-Rice, bread, veal,  chicken, fish, eggs Sage-Meat, fish, poultry, onions, eggplant, tomateos, pork, stews Savory-Eggs, salads, poultry, meat, rice, vegetables, soups, pork Tarragon-Meat, poultry, fish, eggs, butter, vegetables (licorice-like flavor)  Thyme-Meat, poultry, fish, eggs, vegetables, (clover-like flavor), sauces, soups Tumeric-Salads, butter, eggs, fish, rice, vegetables (saffron-like flavor) Vanilla Extract-Baked goods, candy Vinegar-Salads, vegetables, meat marinades Walnut Extract-baked goods, candy   2. Choose your Foods Wisely   The following is a list of foods to avoid which are high in sodium:  Meats-Avoid all smoked, canned, salt cured, dried and kosher meat and fish as well as Anchovies   [employer] meats:Bologna, Liverwurst, Pastrami Canned meat or fish  Marinated herring Caviar    Pepperoni Corned Beef   Pizza Dried chipped beef  Salami Frozen breaded fish or meat Salt pork Frankfurters or hot dogs  Sardines Gefilte fish   Sausage Ham (boiled ham, Proscuitto Smoked butt    spiced ham)   Spam      TV Dinners Vegetables Canned vegetables (Regular) Relish Canned mushrooms  Sauerkraut Olives    Tomato juice Pickles  Bakery and Dessert Products Canned puddings  Cream pies Cheesecake   Decorated cakes Cookies  Beverages/Juices Tomato juice, regular  Gatorade   V-8 vegetable juice, regular  Breads and Cereals Biscuit mixes   Salted potato chips, corn chips, pretzels Bread stuffing mixes  Salted crackers and rolls Pancake and waffle mixes Self-rising flour  Seasonings Accent    Meat sauces Barbecue sauce  Meat tenderizer Catsup    Monosodium glutamate (MSG) Celery salt   Onion salt Chili sauce   Prepared mustard Garlic salt   Salt, seasoned salt, sea salt Gravy mixes   Soy sauce Horseradish   Steak sauce Ketchup   Tartar sauce Lite salt    Teriyaki sauce  Marinade mixes   Worcestershire sauce  Others Baking powder   Cocoa and cocoa mixes Baking  soda   Commercial casserole mixes Candy-caramels, chocolate  Dehydrated soups    Bars, fudge,nougats  Instant rice and pasta mixes Canned broth or soup  Maraschino cherries Cheese, aged and processed cheese and cheese spreads  Learning Assessment Quiz  Indicated T (for True) or F (for False) for each of the following statements:  _____ Fresh fruits and vegetables and unprocessed grains are generally low in sodium _____ Water may contain a considerable amount of sodium, depending on the source _____ You can always tell if a food is high in sodium by tasting it _____ Certain laxatives my be high in sodium and should be avoided unless prescribed   by a physician or pharmacist _____ Salt substitutes may be used freely by anyone on a sodium restricted diet _____ Sodium is present in table salt, food additives and as a natural component of   most foods _____ Table salt is approximately 90% sodium _____ Limiting sodium intake may help prevent excess fluid accumulation in the body _____ On a sodium-restricted diet, seasonings such as bouillon soy sauce, and    cooking wine should be used in place of table salt _____ On an ingredient list, a product which lists monosodium glutamate as the first   ingredient is an appropriate food to include on a low sodium diet  Circle the best answer(s) to the following statements (Hint: there may be more than one correct answer)  11. On a low-sodium diet, some acceptable snack items are:    A. Olives  F. Bean dip   K. Grapefruit juice    B. Salted Pretzels G. Commercial Popcorn   L. Canned peaches    C. Carrot Sticks  H. Bouillon   M. Unsalted nuts   D. French fries  I. Peanut butter crackers N. Salami   E. Sweet pickles J. Tomato Juice   O. Pizza  12.  Seasonings that may be used freely on a reduced - sodium diet include   A. Lemon wedges F.Monosodium glutamate K. Celery seed    B.Soysauce   G. Pepper   L. Mustard powder   C. Sea salt  H. Cooking  wine  M. Onion flakes   D. Vinegar  E. Prepared horseradish N. Salsa   E. Sage   J. Worcestershire sauce  O. Chutney

## 2024-04-01 NOTE — Telephone Encounter (Signed)
 Patient was in the office  today and mentioned to the provider that she had not been using her cpap machine due to gum trouble. I reached out to the patient and she states she has some bad teeth that are hurting her gums and the air blowing from her cpap machine causes her more pain so she has not been wearing her machine every night. Patient was encouraged to contact her dme [employer] and let them know why she has not been compliant with her therapy.

## 2024-04-30 ENCOUNTER — Other Ambulatory Visit: Payer: Self-pay | Admitting: Internal Medicine

## 2024-04-30 DIAGNOSIS — I480 Paroxysmal atrial fibrillation: Secondary | ICD-10-CM

## 2024-04-30 DIAGNOSIS — I5043 Acute on chronic combined systolic (congestive) and diastolic (congestive) heart failure: Secondary | ICD-10-CM

## 2024-04-30 DIAGNOSIS — D500 Iron deficiency anemia secondary to blood loss (chronic): Secondary | ICD-10-CM

## 2024-04-30 NOTE — Telephone Encounter (Signed)
 Pt last saw Rosaline Pavy, [place] 04/01/24, last labs 12/27/23 Creat 0.86, age 64, weight 197.3kg, based on specified criteria pt is on appropriate dosage of Eliquis  5mg  BID for afib.  Will refill rx.

## 2024-05-01 ENCOUNTER — Other Ambulatory Visit: Payer: Self-pay | Admitting: Internal Medicine

## 2024-05-01 DIAGNOSIS — D500 Iron deficiency anemia secondary to blood loss (chronic): Secondary | ICD-10-CM

## 2024-05-01 DIAGNOSIS — I480 Paroxysmal atrial fibrillation: Secondary | ICD-10-CM

## 2024-05-01 DIAGNOSIS — I5043 Acute on chronic combined systolic (congestive) and diastolic (congestive) heart failure: Secondary | ICD-10-CM

## 2024-05-02 NOTE — Telephone Encounter (Signed)
 Pt's pharmacy is requesting a refill on medication metoprolol  25 mg tablet. This medication was D/C off of pt's medication list. I do not see where the provider D/C this medication. Does Dr. Mona want pt to continue taking this medication? Please address

## 2024-05-02 NOTE — Telephone Encounter (Signed)
 Lipids out of Range  In accordance with refill protocols, please review and address the following requirements before this medication refill can be authorized:  Labs

## 2024-05-21 ENCOUNTER — Other Ambulatory Visit: Payer: Self-pay | Admitting: Internal Medicine

## 2024-05-21 DIAGNOSIS — I10 Essential (primary) hypertension: Secondary | ICD-10-CM

## 2024-05-21 DIAGNOSIS — D500 Iron deficiency anemia secondary to blood loss (chronic): Secondary | ICD-10-CM

## 2024-05-21 DIAGNOSIS — G4733 Obstructive sleep apnea (adult) (pediatric): Secondary | ICD-10-CM

## 2024-05-21 DIAGNOSIS — I480 Paroxysmal atrial fibrillation: Secondary | ICD-10-CM

## 2024-05-21 DIAGNOSIS — I5043 Acute on chronic combined systolic (congestive) and diastolic (congestive) heart failure: Secondary | ICD-10-CM

## 2024-05-21 DIAGNOSIS — I5022 Chronic systolic (congestive) heart failure: Secondary | ICD-10-CM

## 2024-05-24 NOTE — Telephone Encounter (Signed)
 In accordance with refill protocols, please review and address the following requirements before this medication refill can be authorized:  Labs  Pt had labs done on 12/27/2023 in care[facility]where, but pt needs Mg labs within 180 days
